# Patient Record
Sex: Female | Born: 1990 | Race: Black or African American | Hispanic: No | Marital: Single | Smoking: Never smoker
Health system: Southern US, Community
[De-identification: ages and names within clinical notes are randomized; demographics above are authoritative.]

## PROBLEM LIST (undated history)

## (undated) DIAGNOSIS — G809 Cerebral palsy, unspecified: Secondary | ICD-10-CM

## (undated) DIAGNOSIS — F70 Mild intellectual disabilities: Secondary | ICD-10-CM

## (undated) DIAGNOSIS — G40909 Epilepsy, unspecified, not intractable, without status epilepticus: Secondary | ICD-10-CM

## (undated) DIAGNOSIS — R569 Unspecified convulsions: Secondary | ICD-10-CM

## (undated) HISTORY — PX: CARDIAC SURGERY: SHX584

## (undated) HISTORY — PX: HIP ARTHROPLASTY: SHX981

## (undated) HISTORY — DX: Epilepsy, unspecified, not intractable, without status epilepticus: G40.909

## (undated) HISTORY — PX: EYE SURGERY: SHX253

## (undated) HISTORY — PX: OTHER SURGICAL HISTORY: SHX169

## (undated) HISTORY — DX: Mild intellectual disabilities: F70

## (undated) HISTORY — PX: INGUINAL HERNIA REPAIR: SHX194

## (undated) HISTORY — PX: KIDNEY SURGERY: SHX687

## (undated) HISTORY — DX: Unspecified convulsions: R56.9

## (undated) HISTORY — DX: Cerebral palsy, unspecified: G80.9

---
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
## 2008-09-07 ENCOUNTER — Emergency Department (HOSPITAL_COMMUNITY): Admission: EM | Admit: 2008-09-07 | Discharge: 2008-09-07 | Payer: Self-pay | Admitting: Emergency Medicine

## 2008-09-09 ENCOUNTER — Emergency Department (HOSPITAL_COMMUNITY): Admission: EM | Admit: 2008-09-09 | Discharge: 2008-09-10 | Payer: Self-pay | Admitting: Emergency Medicine

## 2010-08-17 ENCOUNTER — Encounter: Payer: Self-pay | Admitting: *Deleted

## 2010-08-30 ENCOUNTER — Encounter: Payer: Self-pay | Admitting: Cardiology

## 2010-09-14 ENCOUNTER — Inpatient Hospital Stay (INDEPENDENT_AMBULATORY_CARE_PROVIDER_SITE_OTHER)
Admission: RE | Admit: 2010-09-14 | Discharge: 2010-09-14 | Disposition: A | Discharge status: Home / Self Care | Payer: Medicaid Other | Source: Ambulatory Visit | Attending: Emergency Medicine | Admitting: Emergency Medicine

## 2010-09-14 ENCOUNTER — Ambulatory Visit (INDEPENDENT_AMBULATORY_CARE_PROVIDER_SITE_OTHER): Payer: Medicaid Other

## 2010-09-14 DIAGNOSIS — J309 Allergic rhinitis, unspecified: Secondary | ICD-10-CM

## 2010-09-14 DIAGNOSIS — R05 Cough: Secondary | ICD-10-CM

## 2010-10-05 ENCOUNTER — Encounter: Payer: Self-pay | Admitting: Internal Medicine

## 2010-10-10 ENCOUNTER — Institutional Professional Consult (permissible substitution): Payer: Self-pay | Admitting: Internal Medicine

## 2010-11-03 ENCOUNTER — Institutional Professional Consult (permissible substitution): Payer: Self-pay | Admitting: Internal Medicine

## 2011-07-06 ENCOUNTER — Encounter: Payer: Self-pay | Admitting: Emergency Medicine

## 2011-07-06 ENCOUNTER — Ambulatory Visit (INDEPENDENT_AMBULATORY_CARE_PROVIDER_SITE_OTHER): Payer: Medicaid Other | Admitting: Emergency Medicine

## 2011-07-06 VITALS — BP 130/80 | HR 86 | Temp 98.7°F | Wt 81.0 lb

## 2011-07-06 DIAGNOSIS — Z23 Encounter for immunization: Secondary | ICD-10-CM

## 2011-07-06 DIAGNOSIS — G809 Cerebral palsy, unspecified: Secondary | ICD-10-CM

## 2011-07-06 DIAGNOSIS — G40909 Epilepsy, unspecified, not intractable, without status epilepticus: Secondary | ICD-10-CM

## 2011-07-06 DIAGNOSIS — R011 Cardiac murmur, unspecified: Secondary | ICD-10-CM | POA: Insufficient documentation

## 2011-07-06 DIAGNOSIS — Z0000 Encounter for general adult medical examination without abnormal findings: Secondary | ICD-10-CM | POA: Insufficient documentation

## 2011-07-06 NOTE — Assessment & Plan Note (Signed)
Received flu and Hep B today.  Will investigate if she needs a pap smear as she is not sexually active.

## 2011-07-06 NOTE — Progress Notes (Signed)
Addended by: Phebe Colla on: 07/06/2011 05:18 PM   Modules accepted: Orders

## 2011-07-06 NOTE — Assessment & Plan Note (Signed)
S/p cardiac surgery as a child to repair a "leaky valve."  Was seen by Dr. Clelia Croft and the physician who took over his practice.  Will place a referral to [facility] as that was who the pediatric cardiologist recommended.

## 2011-07-06 NOTE — Progress Notes (Signed)
  Subjective:    Patient ID: Kristin Fitzgerald, female    DOB: Nov 18, 1990, 21 y.o.   MRN: [id number]  HPI Kristin Fitzgerald is here for a new patient appointment.  She was previously seen by [facility].  She was accompanied by her mother and history was obtained from both.  No current concerns.  Does have a history of seizures, but has not a seizure for several years.  This is followed by a neurologist.  I have reviewed and updated the following as appropriate: allergies, current medications, past family history, past medical history, past social history, past surgical history and problem list   Review of Systems  Constitutional: Negative for fever.  HENT: Negative for congestion, sore throat and rhinorrhea.   Respiratory: Negative for shortness of breath.   Cardiovascular: Negative for chest pain.  Gastrointestinal: Negative for abdominal pain.  Genitourinary: Negative for menstrual problem.  Musculoskeletal: Negative for myalgias and arthralgias.  Neurological: Negative for headaches.       Objective:   Physical Exam Vitals: reviewed HEENT: AT/[place], EOMI, PERRL, MMM, no pharyngeal erythema or exudate Neck: no LAD CV: RRR, 2/6 holosystolic murmur Pulm: CTAB, no wheezes, rales Abd: +BS, soft, non-tender Ext: no peripheral edema Skin: no rashes Neuro: in wheelchair     Assessment & Plan:

## 2011-07-06 NOTE — Patient Instructions (Signed)
It was wonderful to meet you!  It sounds like you are doing very well - making my job easy!  I will go ahead and place a referral to [facility] cardiology.  Their office should call you with an appointment in the next 2 weeks.  If they don't call, contact out office and we'll try to figure out what happened.  I will see you back in a year or sooner if needed.

## 2011-07-10 ENCOUNTER — Encounter: Payer: Self-pay | Admitting: *Deleted

## 2011-07-10 ENCOUNTER — Telehealth: Payer: Self-pay | Admitting: *Deleted

## 2011-07-10 NOTE — Telephone Encounter (Signed)
Scheduled appt with Dr.Crenshaw (Cardiology) 07-27-11 at 10 am. Ph # [phone]. Pt had 2 previous appt's before, one was cancelled and one no show. Tried to call pt, but number 'out of reach'. Will try again later. Also, mailed letter with appt info. Lorenda Hatchet, Renato Battles

## 2011-07-10 NOTE — Telephone Encounter (Signed)
See previous message re: appt .Arlyss Repress

## 2011-07-11 NOTE — Telephone Encounter (Signed)
Called mother and gave cardiology appt info.  Gaylene Brooks, RN

## 2011-07-27 ENCOUNTER — Ambulatory Visit: Payer: Medicaid Other | Admitting: Cardiology

## 2011-08-23 ENCOUNTER — Encounter: Payer: Self-pay | Admitting: *Deleted

## 2011-08-24 ENCOUNTER — Ambulatory Visit: Payer: Medicaid Other | Admitting: Cardiology

## 2011-08-29 ENCOUNTER — Telehealth: Payer: Self-pay | Admitting: Family Medicine

## 2011-08-29 NOTE — Telephone Encounter (Signed)
Pt having problems with stomach pain and headache since ~4pm today upon getting home from school.  Mother worried that she might vomit her seizure meds which were taken around 6pm.  No emesis as of yet, no diarrhea, no body aches, no fevers.  Advised use of tylenol and told her that, at this point and hour after taking her seizure meds, not to worry about them not getting into her system if she does vomit.  Advised that if not better by the morning to consider calling for an SDA appointment.

## 2011-09-18 ENCOUNTER — Encounter: Payer: Self-pay | Admitting: Cardiology

## 2011-09-18 ENCOUNTER — Ambulatory Visit (INDEPENDENT_AMBULATORY_CARE_PROVIDER_SITE_OTHER): Payer: Medicaid Other | Admitting: Cardiology

## 2011-09-18 VITALS — BP 131/70 | HR 86

## 2011-09-18 DIAGNOSIS — R011 Cardiac murmur, unspecified: Secondary | ICD-10-CM

## 2011-09-18 DIAGNOSIS — G809 Cerebral palsy, unspecified: Secondary | ICD-10-CM

## 2011-09-18 DIAGNOSIS — G40909 Epilepsy, unspecified, not intractable, without status epilepticus: Secondary | ICD-10-CM

## 2011-09-18 NOTE — Patient Instructions (Signed)

## 2011-09-18 NOTE — Progress Notes (Signed)
  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 I have no records available. She has had a murmur since childhood. She apparently had valve surgery at [facility] in 2007. Her mother states it was a leaky valve. She was followed here in [place] pediatric cardiology but has now age out. She does have cerebral palsy and is unable to ambulate. However there is no dyspnea, pedal edema, chest pain, or syncope  Current Outpatient Prescriptions  Medication Sig Dispense Refill  . CarBAMazepine (TEGRETOL PO) Take 200 mg by mouth 2 (two) times daily.       . Gabapentin (NEURONTIN PO) Take one tab in the morning and two at night        Allergies  Allergen Reactions  . Demerol Nausea And Vomiting    Past Medical History  Diagnosis Date  . Cerebral palsy   . Seizure disorder     Past Surgical History  Procedure Date  . Cardiac surgery     Repaired valve in 2007-[place]  . 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 Hip arthroplasty     for recurrent dislocation; rebuilt acetabulum    History   Social History  . Marital Status: Single    Spouse Name: N/A    Number of Children: N/A  . Years of Education: N/A   Occupational History  . Not on file.   Social History Main Topics  . Smoking status: Passive Smoker  . Smokeless tobacco: Not on file  . Alcohol Use: No  . Drug Use: No  . Sexually Active: Not on file   Other Topics Concern  . Not on file   Social History Narrative    Lives with her mother and 78 year old brother Berna Spare.  Attends [employer]. exercises daily.  Never sexually active.  No drug use no smoking no ETOH.  Very supportive family and excellent care.Graduating high school in 11/2011.      Family History  Problem Relation Age of Onset  . Asthma Brother     ROS: She has difficulties with hip problems; unable to read or write but no fevers or  chills, productive cough, hemoptysis, dysphasia, odynophagia, melena, hematochezia, dysuria, hematuria, rash, orthopnea, PND, pedal edema. Remaining systems are negative.  Physical Exam:   Blood pressure 131/70, pulse 86.  General:  Well developed/well nourished in NAD Skin warm/dry Patient not depressed No peripheral clubbing Back-severe scoliosis HEENT-strabismus/normal eyelids Neck supple/normal carotid upstroke bilaterally; no bruits; no JVD; no thyromegaly chest - CTA/ normal expansion CV - RRR/normal S1 and S2; 3/6 diastolic murmur left sternal border. 1/6 systolic murmur. Abdomen -NT/ND, no HSM, no mass, + bowel sounds, no bruit 2+ femoral pulses Ext-no edema,  2+ DP Neuro- patient with history of cerebral palsy. Unable to ambulate. Unable to read or write. Pleasant and oriented to person, place and time.  ECG sinus rhythm at a rate of 82. Right axis deviation. No ST changes.

## 2011-09-18 NOTE — Assessment & Plan Note (Signed)
Patient has what sounds to be a loud aortic insufficiency murmur. I will check an echocardiogram. We will obtain records from her pediatric cardiologist in from [facility] concerning her previous surgery. Further recommendations to follow.

## 2011-09-18 NOTE — Assessment & Plan Note (Signed)
Management per primary care. 

## 2011-09-19 ENCOUNTER — Telehealth: Payer: Self-pay | Admitting: Cardiology

## 2011-09-19 NOTE — Telephone Encounter (Signed)
ROI faxed to... [facility] @ [phone] & [facility] @ [phone] 09/19/11/KM

## 2011-09-25 NOTE — Telephone Encounter (Signed)
Records Received from [facility] gave to [facility]  09/25/11/KM

## 2011-10-04 NOTE — Telephone Encounter (Signed)
Request REceived back from Dr.Shaw/[facility] stating  They have No records on this pt  10/04/11/KM

## 2011-10-10 ENCOUNTER — Other Ambulatory Visit (HOSPITAL_COMMUNITY): Payer: Medicaid Other

## 2011-10-20 ENCOUNTER — Other Ambulatory Visit (HOSPITAL_COMMUNITY): Payer: Medicaid Other

## 2011-10-25 ENCOUNTER — Other Ambulatory Visit (HOSPITAL_COMMUNITY): Payer: Medicaid Other

## 2011-11-02 ENCOUNTER — Ambulatory Visit (HOSPITAL_COMMUNITY): Payer: Medicaid Other | Attending: Cardiology

## 2011-11-02 ENCOUNTER — Other Ambulatory Visit: Payer: Self-pay

## 2011-11-02 DIAGNOSIS — I359 Nonrheumatic aortic valve disorder, unspecified: Secondary | ICD-10-CM | POA: Insufficient documentation

## 2011-11-02 DIAGNOSIS — R011 Cardiac murmur, unspecified: Secondary | ICD-10-CM | POA: Insufficient documentation

## 2011-11-02 DIAGNOSIS — I059 Rheumatic mitral valve disease, unspecified: Secondary | ICD-10-CM | POA: Insufficient documentation

## 2011-11-02 DIAGNOSIS — G809 Cerebral palsy, unspecified: Secondary | ICD-10-CM | POA: Insufficient documentation

## 2011-11-07 ENCOUNTER — Encounter: Payer: Self-pay | Admitting: Family Medicine

## 2011-11-07 ENCOUNTER — Ambulatory Visit (INDEPENDENT_AMBULATORY_CARE_PROVIDER_SITE_OTHER): Payer: Medicaid Other | Admitting: Family Medicine

## 2011-11-07 VITALS — BP 109/70 | HR 76 | Temp 98.7°F

## 2011-11-07 DIAGNOSIS — K089 Disorder of teeth and supporting structures, unspecified: Secondary | ICD-10-CM

## 2011-11-07 DIAGNOSIS — K0889 Other specified disorders of teeth and supporting structures: Secondary | ICD-10-CM | POA: Insufficient documentation

## 2011-11-07 DIAGNOSIS — Z23 Encounter for immunization: Secondary | ICD-10-CM

## 2011-11-07 MED ORDER — AMOXICILLIN 400 MG/5ML PO SUSR: ORAL | Status: DC

## 2011-11-07 NOTE — Progress Notes (Signed)
  Subjective:    Patient ID: Kristin Fitzgerald, female    DOB: Mar 01, 1991, 21 y.o.   MRN: [id number]  HPI 3 days of tooth pain, right side.    Was better yesterday, hurts again today.  No fever, chills, able to eat.  Requests antibiotic so she can go to dentist- history of congenital heat disease  I have reviewed patient's  PMH, FH, and Social history and Medications as related to this visit. Developmentally delayed.  History of congenital heart disease Review of Systems See HPI    Objective:   Physical Exam GEN: Alert & Oriented, No acute distress, in wheelchair Mouth:  Right lower tooth missing, no significant swelling, inflammation       Assessment & Plan:

## 2011-11-07 NOTE — Assessment & Plan Note (Signed)
Chronically poor dentition and gingival disease.  Pain over area missing tooth- no acute abscess or infection.  Will rx amoxicllin (needs liquid meds) to give 1 hr before dental procedure due to history of repaired congenital heart disease.  But no need for continuous antibiotic treatment.  Gave info on [employer].

## 2011-11-07 NOTE — Patient Instructions (Signed)
Call dentist for apopintment  Take amoxicillin prior to apopintment

## 2012-05-31 ENCOUNTER — Ambulatory Visit (INDEPENDENT_AMBULATORY_CARE_PROVIDER_SITE_OTHER): Payer: Medicaid Other | Admitting: *Deleted

## 2012-05-31 DIAGNOSIS — Z23 Encounter for immunization: Secondary | ICD-10-CM

## 2012-07-03 ENCOUNTER — Encounter: Payer: Medicaid Other | Admitting: Emergency Medicine

## 2012-07-26 ENCOUNTER — Encounter: Payer: Medicaid Other | Admitting: Emergency Medicine

## 2012-08-15 ENCOUNTER — Encounter: Payer: Self-pay | Admitting: Emergency Medicine

## 2012-08-15 ENCOUNTER — Ambulatory Visit (INDEPENDENT_AMBULATORY_CARE_PROVIDER_SITE_OTHER): Payer: Medicaid Other | Admitting: Emergency Medicine

## 2012-08-15 VITALS — BP 144/87 | HR 98 | Temp 97.5°F | Wt 81.0 lb

## 2012-08-15 LAB — POCT WET PREP (WET MOUNT): Clue Cells Wet Prep Whiff POC: NEGATIVE

## 2012-08-15 MED ORDER — FLUCONAZOLE 10 MG/ML PO SUSR: 150.0000 mg | Freq: Once | ORAL | Status: DC

## 2012-08-15 NOTE — Patient Instructions (Addendum)
It was nice to see you! You have a yeast infection. Please avoid scents and dyes in all lotions, soaps and detergents. Only use soap twice a week. Take the diflucan.  You can repeat the dose in 3 days if symptoms persist.

## 2012-08-15 NOTE — Progress Notes (Signed)
  Subjective:    Patient ID: Kristin Fitzgerald, female    DOB: [birth year], 22 y.o.   MRN: [id number]  HPI Kristin Fitzgerald is here for a SDA with her mother for vaginal discharge.  Mom reports that Kristin Fitzgerald has been complaining of vaginal itching and some white discharge over the last 2-3 days.  Kristin Fitzgerald is not sexually active.  Washes vulva daily with dove soap and wash cloth.  Wears diapers that are changed frequently by her mother.  No fevers, n/v/d.  Healthcare maintanence: Mom requesting form be filled out for an aide to come to the house and assist with routine care.   I have reviewed and updated the following as appropriate: allergies, current medications and past medical history SHx: passive smoke exposure   Review of Systems See HPI    Objective:   Physical Exam BP 144/87  Pulse 98  Temp(Src) 97.5 F (36.4 C) (Oral)  Wt 81 lb (36.741 kg)  LMP 08/01/2012 Gen: alert, cooperative, no distress, appears embarrassed Pelvic: external genitalia normal female; small excoriation on right labia majora, internal exam deferred; mother collected blind wet prep sample     Assessment & Plan:  22 yo F with CP and seizure disorder presenting with vaginal discharge found to have yeast infection on wet prep.  Yeast infection: Will treat with diflucan 150mg  x1 with repeat dose in 3 days if no improvement of symptoms.  Also discussed avoid dyes and scents in lotions, soaps and detergents.

## 2012-08-15 NOTE — Assessment & Plan Note (Signed)
Form filled out for home aide to assist with ADLs.  Due for pap smear - however given her extreme discomfort with the idea of a pelvic exam and her abstinence, will defer for now.

## 2012-08-27 ENCOUNTER — Telehealth: Payer: Self-pay | Admitting: *Deleted

## 2012-08-27 NOTE — Telephone Encounter (Signed)
Called pt's mom and checked on the process for Personal Care Services (We have faxed the application for services at last OV. Pt's mom reports, that they will come to their house on Monday and evaluate patient's needs. (Pt's mom also has copy of the application) .Kristin Fitzgerald

## 2012-09-11 ENCOUNTER — Telehealth: Payer: Self-pay | Admitting: Emergency Medicine

## 2012-09-11 NOTE — Telephone Encounter (Signed)
Mother states  patient has just recently started with a little cough and nasal  Stuffiness. Mother bought some sudafed but not sure she should use. No fever. Consulted with Dr. Lum Babe. Recommended not to use anything with decongestant . May use OTC cough med without decongestant, saline nasal spray , cool mist humidifer . If  worsening  call for appointment.

## 2012-09-11 NOTE — Telephone Encounter (Signed)
pt has had open heart surgery in 2007 and she has a stuffy nose/cough - wants to know what OTC she can give her.

## 2012-09-12 NOTE — Telephone Encounter (Signed)
Pt is now c/o of a headache and wants to know what she can give her. pls advise

## 2012-09-12 NOTE — Telephone Encounter (Signed)
Mother states she usually givesTylenol or Motrin. Advised this will be OK to give.  Offered appointment for nasal congestion and appointment is scheduled for tomorrow.

## 2012-09-13 ENCOUNTER — Encounter: Payer: Self-pay | Admitting: Family Medicine

## 2012-09-13 ENCOUNTER — Ambulatory Visit (INDEPENDENT_AMBULATORY_CARE_PROVIDER_SITE_OTHER): Payer: Medicaid Other | Admitting: Family Medicine

## 2012-09-13 VITALS — BP 130/77 | HR 104 | Wt 81.0 lb

## 2012-09-13 DIAGNOSIS — R0981 Nasal congestion: Secondary | ICD-10-CM

## 2012-09-13 DIAGNOSIS — J3489 Other specified disorders of nose and nasal sinuses: Secondary | ICD-10-CM

## 2012-09-13 NOTE — Progress Notes (Signed)
  Subjective:    Patient ID: Kristin Fitzgerald, female    DOB: 04-Jun-1991, 22 y.o.   MRN: [id number]  HPI: Pt presents to clinic for SDA for about 3 days of nasal congestion and dry cough with development of headaches yesterday. 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 Pt began with symptoms of cough with nasal congestion earlier this week; no production to cough, and cough seems to be slightly better than it was, some improvement with Delsym cough syrup; also some complaints of chest soreness secondary to cough. Headaches yesterday seem to be related to congestion; pt indicates central forehead/between eyes as main location of pain. Pt has reduced appetite but is "drinking okay," with normal voids and stools. Some reduced level of activity and sleeping poorly. Mother gave Tylenol around 6 PM for pain and pt was able to "sleep a little better" than before. Cool mist humidifier did not help. Mother has been avoiding decongestant medications due to history of heart surgery (valve replacement in 2007).  Pt has a history of seizures, stable on Tegretol. Also takes Neurontin. No new side effects or difficulties. No recent seizures.  Review of Systems: As above. Mother reports no fever. No obvious difficulty breathing or audible wheezing. No abdominal pain, N/V or diarrhea. Otherwise unable to directly gain much other detail from patient. Sick contacts: nephew with similar but milder symptoms for a few days prior to pt's illness, mother now with some cough/congestion that developed after pt's symptoms.     Objective:   Physical Exam BP 130/77  Pulse 104  Wt 81 lb (36.741 kg)  SpO2 97%  LMP 08/29/2012 Gen: non-toxic appearing wheelchair-bound young adult HEENT: pupils equally round/reactive to light, conjunctivae clear, MMM; TM's clear bilaterally  post oropharynx without exudate; some redness/slight cobblestoning  Nasal mucosa  red and slightly boggy, no rhinorrhea or frank nasal discharge  Some sinus tenderness worse at nasal bridge and over frontal sinus than over maxillary sinus Cardio: RRR; heart murmur noted, 2-3/6 at left sternal border Pulm: slightly coarse breath sounds equal bilaterally, but generally CTAB with good air movement  no localized areas of reduced breath sounds, no wheezes Skin: no obvious rash; warm, dry     Assessment & Plan:

## 2012-09-13 NOTE — Assessment & Plan Note (Signed)
A: Generally non-toxic appearing with normal vital signs. Exam not concerning for flagrant bacterial infection or pneumonia.  P: Discussed with Dr. Earnest Bailey. Recommended continued supportive care. Discussed use of Afrin on a short-term basis, Vick's vapor-rub, guaifenesin to help loosen secretions. Continue Tylenol for pain/discomfort. Red flags reviewed. Follow up PRN.

## 2012-09-13 NOTE — Patient Instructions (Signed)
Thank you for coming in, today. I'm sorry Kristin Fitzgerald is not feeling well. There are a few other things you can try to help her congestion.    Vick's vapor-rub may help some if she can tolerate the smell.    Afrin nasal spray may help, as well. Do not use it for more than 3 days or so.    Mucinex may also help to loosen some of the congestion. She can continue to use Tylenol and her cough medicine. If her symptoms don't start to get better over the weekend, or if she begins to get worse, call the clinic back or go to the emergency room. Some signs to watch out for are high fevers, difficulty breathing, refusal to eat or drink at all, or crying without making tears. Please feel free to call with questions at any time. --Dr. Casper Harrison

## 2012-10-19 ENCOUNTER — Encounter (HOSPITAL_COMMUNITY): Payer: Self-pay | Admitting: Emergency Medicine

## 2012-10-19 ENCOUNTER — Emergency Department (HOSPITAL_COMMUNITY)
Admission: EM | Admit: 2012-10-19 | Discharge: 2012-10-19 | Disposition: A | Discharge status: Home / Self Care | Payer: Medicaid Other | Attending: Emergency Medicine | Admitting: Emergency Medicine

## 2012-10-19 DIAGNOSIS — R300 Dysuria: Secondary | ICD-10-CM | POA: Insufficient documentation

## 2012-10-19 DIAGNOSIS — Z8669 Personal history of other diseases of the nervous system and sense organs: Secondary | ICD-10-CM | POA: Insufficient documentation

## 2012-10-19 DIAGNOSIS — M549 Dorsalgia, unspecified: Secondary | ICD-10-CM

## 2012-10-19 DIAGNOSIS — Z96649 Presence of unspecified artificial hip joint: Secondary | ICD-10-CM | POA: Insufficient documentation

## 2012-10-19 DIAGNOSIS — Z9889 Other specified postprocedural states: Secondary | ICD-10-CM | POA: Insufficient documentation

## 2012-10-19 DIAGNOSIS — G40909 Epilepsy, unspecified, not intractable, without status epilepticus: Secondary | ICD-10-CM | POA: Insufficient documentation

## 2012-10-19 DIAGNOSIS — Z79899 Other long term (current) drug therapy: Secondary | ICD-10-CM | POA: Insufficient documentation

## 2012-10-19 DIAGNOSIS — Z3202 Encounter for pregnancy test, result negative: Secondary | ICD-10-CM | POA: Insufficient documentation

## 2012-10-19 LAB — URINALYSIS, ROUTINE W REFLEX MICROSCOPIC
Bilirubin Urine: NEGATIVE
Leukocytes, UA: NEGATIVE
Nitrite: NEGATIVE
Protein, ur: NEGATIVE mg/dL
Urobilinogen, UA: 1 mg/dL (ref 0.0–1.0)
pH: 8 (ref 5.0–8.0)

## 2012-10-19 LAB — URINE MICROSCOPIC-ADD ON

## 2012-10-19 MED ORDER — IBUPROFEN 400 MG PO TABS
400.0000 mg | ORAL_TABLET | Freq: Once | ORAL | Status: DC
  Filled 2012-10-19: qty 1

## 2012-10-19 NOTE — ED Provider Notes (Signed)
History     CSN: [id number]  Arrival date & time 10/19/12  1430   First MD Initiated Contact with Patient 10/19/12 1522      Chief Complaint  Patient presents with  . Back Pain    (Consider location/radiation/quality/duration/timing/severity/associated sxs/prior treatment) HPI  Kristin Fitzgerald is a 22 y.o. female with past medical history significant for cerebral palsy and seizure disorder, accompanied by her mother who states that she's been complaining of back pain and dysuria onset last night. Denies fever, nausea vomiting, reduced by mouth intake. Level V caveat secondary to her cerebral palsy.    Past Medical History  Diagnosis Date  . Cerebral palsy   . Seizure disorder     Past Surgical History  Procedure Laterality Date  . Cardiac surgery      Repaired valve in 2007-[place]  . 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 Hip arthroplasty      for recurrent dislocation; rebuilt acetabulum    Family History  Problem Relation Age of Onset  . Asthma Brother     History  Substance Use Topics  . Smoking status: Passive Smoke Exposure - Never Smoker  . Smokeless tobacco: Not on file  . Alcohol Use: No    OB History   Grav Para Term Preterm Abortions TAB SAB Ect Mult Living                  Review of Systems  Constitutional: Negative for fever.  Gastrointestinal: Negative for nausea, vomiting and diarrhea.  Genitourinary: Positive for dysuria.  Musculoskeletal: Positive for back pain.  All other systems reviewed and are negative.    Allergies  Demerol  Home Medications   Current Outpatient Rx  Name  Route  Sig  Dispense  Refill  . amoxicillin (AMOXIL) 400 MG/5ML suspension      2000 mg 30-60 minutes prior to dental procedure   50 mL   0   . carbamazepine (CARBATROL) 200 MG 12 hr capsule   Oral   Take 200 mg by mouth 2 (two) times daily.        Marland Kitchen gabapentin (NEURONTIN) 100 MG capsule   Oral   Take 100-200 mg by mouth 2 (two) times daily. Take 1 capsule in the morning (100 mg) and 2 capsules at bedtime (200 mg)           BP 126/53  Pulse 89  Temp(Src) 98.4 F (36.9 C) (Oral)  Resp 18  SpO2 100%  LMP 10/14/2012  Physical Exam  Nursing note and vitals reviewed. Constitutional: She is oriented to person, place, and time. She appears well-developed and well-nourished. No distress.  Patient is comfortable, smiling, good eye contact, coloring in a coloring book, in no acute distress  HENT:  Head: Normocephalic.  Mouth/Throat: Oropharynx is clear and moist.  Eyes: Conjunctivae and EOM are normal. Pupils are equal, round, and reactive to light.  Cardiovascular: Normal rate, regular rhythm and intact distal pulses.   Pulmonary/Chest: Effort normal and breath sounds normal. No stridor. No respiratory distress. She has no wheezes. She has no rales. She exhibits no tenderness.  Abdominal: Soft. Bowel sounds are normal. She exhibits no distension and no mass. There is no tenderness. There is no rebound and no guarding.  Genitourinary:  NO CVA TTP b/l  Musculoskeletal: Normal range of motion. She exhibits no  edema.  Neurological: She is alert and oriented to person, place, and time.    ED Course  Procedures (including critical care time)  Labs Reviewed  URINALYSIS, ROUTINE W REFLEX MICROSCOPIC - Abnormal; Notable for the following:    APPearance CLOUDY (*)    Hgb urine dipstick MODERATE (*)    All other components within normal limits  URINE MICROSCOPIC-ADD ON - Abnormal; Notable for the following:    Squamous Epithelial / LPF MANY (*)    All other components within normal limits  POCT PREGNANCY, URINE   No results found.   1. Back pain       MDM   Kristin Fitzgerald is a 22 y.o. female with cerebral palsy accompanied by her mother who reports that the patient has had back pain and dysuria since last  night.  And out cath UA specimen shows hemoglobin with no signs of infection. Patient ended her menstruation yesterday. No indication to treat for UTI at this time. I will culture the urine recommends pain control with acetaminophen as her mother has been doing previously. Vital signs are stable and patient's mother is amenable to care plan and discharge at this time.  Discussed case with attending who agrees with plan and stability to d/c to home.    Filed Vitals:   10/19/12 1600 10/19/12 1610 10/19/12 1615 10/19/12 1630  BP: 128/91 128/91 121/83 123/56  Pulse: 99 90 94 92  Temp:  98 F (36.7 C)    TempSrc:  Oral    Resp:  20    SpO2: 100% 100% 100% 100%     Pt verbalized understanding and agrees with care plan. Outpatient follow-up and return precautions given.       Wynetta Emery, PA-C 10/19/12 1725

## 2012-10-19 NOTE — ED Notes (Signed)
Mother stated, the daughter was c/o back pain and burning with urination.

## 2012-10-20 NOTE — ED Provider Notes (Signed)
Medical screening examination/treatment/procedure(s) were performed by non-physician practitioner and as supervising physician I was immediately available for consultation/collaboration.    Kevonta Phariss D Kavitha Lansdale, MD 10/20/12 1102 

## 2012-10-21 LAB — URINE CULTURE

## 2012-11-25 ENCOUNTER — Other Ambulatory Visit: Payer: Self-pay

## 2012-11-25 DIAGNOSIS — G40209 Localization-related (focal) (partial) symptomatic epilepsy and epileptic syndromes with complex partial seizures, not intractable, without status epilepticus: Secondary | ICD-10-CM

## 2012-11-25 MED ORDER — GABAPENTIN 100 MG PO CAPS: ORAL_CAPSULE | ORAL | Status: DC

## 2013-01-24 ENCOUNTER — Other Ambulatory Visit: Payer: Self-pay | Admitting: Family

## 2013-01-24 DIAGNOSIS — G40209 Localization-related (focal) (partial) symptomatic epilepsy and epileptic syndromes with complex partial seizures, not intractable, without status epilepticus: Secondary | ICD-10-CM

## 2013-01-24 DIAGNOSIS — G40309 Generalized idiopathic epilepsy and epileptic syndromes, not intractable, without status epilepticus: Secondary | ICD-10-CM

## 2013-01-24 MED ORDER — CARBAMAZEPINE ER 200 MG PO CP12: ORAL_CAPSULE | ORAL | Status: DC

## 2013-04-16 ENCOUNTER — Ambulatory Visit (INDEPENDENT_AMBULATORY_CARE_PROVIDER_SITE_OTHER): Payer: Medicaid Other | Admitting: Emergency Medicine

## 2013-04-16 ENCOUNTER — Encounter: Payer: Self-pay | Admitting: Emergency Medicine

## 2013-04-16 VITALS — BP 123/68 | HR 83 | Temp 98.0°F

## 2013-04-16 DIAGNOSIS — Z23 Encounter for immunization: Secondary | ICD-10-CM

## 2013-04-16 DIAGNOSIS — Z0000 Encounter for general adult medical examination without abnormal findings: Secondary | ICD-10-CM

## 2013-04-16 DIAGNOSIS — N898 Other specified noninflammatory disorders of vagina: Secondary | ICD-10-CM | POA: Insufficient documentation

## 2013-04-16 DIAGNOSIS — G40909 Epilepsy, unspecified, not intractable, without status epilepticus: Secondary | ICD-10-CM

## 2013-04-16 DIAGNOSIS — R011 Cardiac murmur, unspecified: Secondary | ICD-10-CM

## 2013-04-16 LAB — POCT WET PREP (WET MOUNT): Clue Cells Wet Prep Whiff POC: POSITIVE

## 2013-04-16 MED ORDER — FLUCONAZOLE 150 MG PO TABS: 150.0000 mg | ORAL_TABLET | Freq: Once | ORAL | Status: DC

## 2013-04-16 MED ORDER — METRONIDAZOLE 500 MG PO TABS: 500.0000 mg | ORAL_TABLET | Freq: Two times a day (BID) | ORAL | Status: DC

## 2013-04-16 NOTE — Assessment & Plan Note (Addendum)
Self collected wet prep shows BV Will treat with flagyl 500mg  BID x7 days, followed by diflucan 150mg  x1. Called and left message notified mom of results.

## 2013-04-16 NOTE — Patient Instructions (Signed)
It was nice to see you!  Please let me know if you need a referral for her to see her cardiologist, Dr. Jens Som.  Schedule an appointment in the next few months for a pap smear.  I will call with the results of the wet prep.

## 2013-04-16 NOTE — Progress Notes (Signed)
  Subjective:    Patient ID: Kristin Fitzgerald, female    DOB: 1990/07/28, 22 y.o.   MRN: [id number]  HPI ALEENE SWANNER is here for her annual exam and flu shot with her mother who is the primary care giver.  I have reviewed and updated the following as appropriate: allergies, current medications, past family history, past medical history, past social history, past surgical history and problem list PMHx: CP, seizures, heart murmur  SHx: passive smoke exposure  Seizures Mom reports no seizure activity.  Due to see neurology soon.  No changes in medications or doses.  Murmur Had heart surgery as a child.  Last seen by Dr. Jens Som in 10/2011.  Mom is working on setting up a f/u appt.  No chest pain, dyspnea, syncope, dizziness, palpitations, leg swelling.  Vaginal discharge She was treated for a yeast infection earlier this year.  She has started complaining of vaginal itching and mom has noticed some thick white discharge again.  Mom states she has been washing every other day with soap.  Review of Systems See HPI    Objective:   Physical Exam BP 123/68  Pulse 83  Temp(Src) 98 F (36.7 C) (Oral)  LMP 03/26/2013 Gen: alert, cooperative, NAD, sitting in wheelchair comfortably HEENT: AT/[place], sclera white, MMM, PERRL, no pharnygeal erythema or exudate, TMs obscured by cerumen bilaterally Neck: supple CV: RRR, 3/6 diastolic murmur at LUSB Pulm: CTAB, no wheezes or rales Abd: +BS, soft, NTND Ext: no edema      Assessment & Plan:

## 2013-04-16 NOTE — Addendum Note (Signed)
Addended by: Charm Rings on: 04/16/2013 05:34 PM   Modules accepted: Orders

## 2013-04-16 NOTE — Assessment & Plan Note (Signed)
Flu shot given today. Discussed pap smear extensively.  Mom will discuss with her further at home and schedule a dedicated appointment for the pap smear in the next few months.

## 2013-04-16 NOTE — Assessment & Plan Note (Signed)
Appears to be stable.  No cardiac symptoms today. Mom will call [facility] about a follow up appt since it has been over 1 year since she has been seen. I will be happy to provide a referral if needed.

## 2013-04-16 NOTE — Assessment & Plan Note (Signed)
Stable on carbamazepine and neurontin. Sees neurology once a year.

## 2013-04-17 ENCOUNTER — Telehealth: Payer: Self-pay | Admitting: Emergency Medicine

## 2013-04-17 DIAGNOSIS — N898 Other specified noninflammatory disorders of vagina: Secondary | ICD-10-CM

## 2013-04-17 MED ORDER — METRONIDAZOLE 50 MG/ML ORAL SUSPENSION: 500.0000 mg | Freq: Two times a day (BID) | ORAL | Status: DC

## 2013-04-17 MED ORDER — METRONIDAZOLE 500 MG PO TABS: 500.0000 mg | ORAL_TABLET | Freq: Two times a day (BID) | ORAL | Status: DC

## 2013-04-17 MED ORDER — FLUCONAZOLE 10 MG/ML PO SUSR: 150.0000 mg | Freq: Every day | ORAL | Status: DC

## 2013-04-17 NOTE — Telephone Encounter (Signed)
Mother called because both of the prescriptions from yesterday are in pill form. Her daughter can not swallow pills, she would like Dr. Piedad Climes to call in the liquid form to the pharmacy. She has not picked up the prescriptions yes. Please call her at her work phone is [phone] ask for Misty Stanley. Myriam Jacobson

## 2013-04-17 NOTE — Telephone Encounter (Signed)
Pt notified.  Tysin Salada L, CMA  

## 2013-04-17 NOTE — Telephone Encounter (Signed)
I have resent prescriptions in liquid form.

## 2013-04-17 NOTE — Telephone Encounter (Signed)
I changed the prescription for Flagyl back to pills.  The pharmacy does not have the liquid formulation.  The pills can be crushed and mixed with applesauce.

## 2013-04-17 NOTE — Telephone Encounter (Signed)
Will fwd to MD.  Hayk Divis L, CMA  

## 2013-04-18 NOTE — Telephone Encounter (Signed)
Attempted several times today to reach patient's mother and was unsuccessful.  If patient calls back, please give her message below.  Yvonnia Tango, Darlyne Russian, CMA

## 2013-05-19 DIAGNOSIS — G40309 Generalized idiopathic epilepsy and epileptic syndromes, not intractable, without status epilepticus: Secondary | ICD-10-CM

## 2013-05-19 DIAGNOSIS — M4150 Other secondary scoliosis, site unspecified: Secondary | ICD-10-CM

## 2013-05-19 DIAGNOSIS — G40209 Localization-related (focal) (partial) symptomatic epilepsy and epileptic syndromes with complex partial seizures, not intractable, without status epilepticus: Secondary | ICD-10-CM

## 2013-05-19 DIAGNOSIS — Q120 Congenital cataract: Secondary | ICD-10-CM | POA: Insufficient documentation

## 2013-05-19 DIAGNOSIS — H519 Unspecified disorder of binocular movement: Secondary | ICD-10-CM | POA: Insufficient documentation

## 2013-05-19 DIAGNOSIS — G808 Other cerebral palsy: Secondary | ICD-10-CM | POA: Insufficient documentation

## 2013-05-19 DIAGNOSIS — F70 Mild intellectual disabilities: Secondary | ICD-10-CM | POA: Insufficient documentation

## 2013-05-19 DIAGNOSIS — R32 Unspecified urinary incontinence: Secondary | ICD-10-CM

## 2013-05-26 ENCOUNTER — Other Ambulatory Visit: Payer: Self-pay | Admitting: Family

## 2013-05-27 ENCOUNTER — Ambulatory Visit: Payer: Self-pay | Admitting: Pediatrics

## 2013-05-31 ENCOUNTER — Other Ambulatory Visit: Payer: Self-pay | Admitting: Family

## 2013-06-03 ENCOUNTER — Other Ambulatory Visit: Payer: Self-pay | Admitting: Family

## 2013-07-08 ENCOUNTER — Encounter: Payer: Self-pay | Admitting: Pediatrics

## 2013-07-08 ENCOUNTER — Ambulatory Visit (INDEPENDENT_AMBULATORY_CARE_PROVIDER_SITE_OTHER): Payer: Medicaid Other | Admitting: Pediatrics

## 2013-07-08 VITALS — BP 100/70 | HR 108 | Wt 105.0 lb

## 2013-07-08 DIAGNOSIS — M4150 Other secondary scoliosis, site unspecified: Secondary | ICD-10-CM

## 2013-07-08 DIAGNOSIS — H53002 Unspecified amblyopia, left eye: Secondary | ICD-10-CM

## 2013-07-08 DIAGNOSIS — G40209 Localization-related (focal) (partial) symptomatic epilepsy and epileptic syndromes with complex partial seizures, not intractable, without status epilepticus: Secondary | ICD-10-CM

## 2013-07-08 DIAGNOSIS — H5010 Unspecified exotropia: Secondary | ICD-10-CM

## 2013-07-08 DIAGNOSIS — R32 Unspecified urinary incontinence: Secondary | ICD-10-CM

## 2013-07-08 DIAGNOSIS — H53009 Unspecified amblyopia, unspecified eye: Secondary | ICD-10-CM

## 2013-07-08 DIAGNOSIS — F70 Mild intellectual disabilities: Secondary | ICD-10-CM

## 2013-07-08 DIAGNOSIS — G808 Other cerebral palsy: Secondary | ICD-10-CM

## 2013-07-08 DIAGNOSIS — G40309 Generalized idiopathic epilepsy and epileptic syndromes, not intractable, without status epilepticus: Secondary | ICD-10-CM

## 2013-07-08 MED ORDER — GABAPENTIN 100 MG PO CAPS: ORAL_CAPSULE | ORAL | Status: DC

## 2013-07-08 MED ORDER — CARBATROL 200 MG PO CP12: ORAL_CAPSULE | ORAL | Status: DC

## 2013-07-08 NOTE — Progress Notes (Signed)
Patient: Kristin Fitzgerald MRN: [id number] Sex: female DOB: 1990-06-30  Provider: Deetta PerlaHICKLING,WILLIAM H, MD [facility]  Note type: Routine return visit  History of Present Illness: [facility]  Dr. Elwyn ReachBooth History from: mother, patient and [facility] chart Chief Complaint: Seizures and spastic quadriparesis  Kristin Fitzgerald is a 23 y.o. 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  The patient was seen on July 08, 2013, for the first time since May 27, 2012.  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  She had significant cognitive impairment and quadriparesis with sparing of her left arm.  She requires wheelchair for ambulation.  She has well controlled seizures.  She is largely dependent on others for most activities of daily living.  She is unable to read, wtite, or calculate.  She is able to communicate her wants and needs.  She is dysarthric, but intelligible.  Little has changed since she was seen 13 months ago.  She remains at home.  She is on the waiting list for CAPS.  Her mother missed an opportunity to sign her up when she was younger and now there is not only a long waiting list, but the number of individuals allowed into the program has been cut.  Her brother is not working.  He stays home with her during the day.  At times his girlfriend also provides care.  Mother works from 8 to 5, Monday through Friday and comes home at noontime to change her diaper and help to feed her.  She has an aide paid for by Medicaid.  If she had a CAPS worker, she would have greater ability to take advantage of socialization with other young adults who have disabilities.    Overall, her health has been good.  She had a urinary  tract infection a couple of months ago diagnosed with symptoms of dysuria.  She has had no seizures in years.  She takes and tolerates Carbatrol and gabapentin.  Review of Systems: 12 system review was unremarkable  Past Medical History  Diagnosis Date  . Cerebral palsy   . Seizure disorder   . Seizures   . Mild intellectual disabilities    Hospitalizations: yes, Head Injury: no, Nervous System Infections: yes, Immunizations up to date: yes Past Medical History Comments: See surgical Hx for hospitalizations.  Birth History 3 lbs. 1 oz. Infant born at 1730 weeks gestational age  Gestation was complicated by premature delivery for unknown reasons. Nursery Course was complicated by seizures a day 3 of life.  The patient had no hemorrhage but had periventricular leukomalacia. Growth and Development was recalled as  abnormal  Behavior History none  Surgical History Past Surgical History  Procedure Laterality Date  . Cardiac surgery      Repaired valve in 2007-[place]  . 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 Hip arthroplasty      for recurrent dislocation; rebuilt acetabulum  . Inguinal hernia repair    . Other surgical history      [facility] as an infant    Family History family history includes Asthma in her brother; Breast cancer in her maternal aunt; Kidney failure in her maternal  grandmother; Leukemia in her maternal grandfather. Family History is negative migraines, seizures, cognitive impairment, blindness, deafness, birth defects, chromosomal disorder, autism.  Social History History   Social History  . Marital Status: Single    Spouse Name: N/A    Number of Children: N/A  . Years of Education: N/A   Social History Main Topics  . Smoking status: Passive Smoke Exposure - Never Smoker  . Smokeless tobacco: Never Used  . Alcohol Use: No  . Drug Use: No  .  Sexual Activity: No   Other Topics Concern  . None   Social History Narrative    Lives with her mother and 48 year old brother Berna Spare.  Attends [employer]. exercises daily.  Never sexually active.  No drug use no smoking no ETOH.  Very supportive family and excellent care.      Graduating high school in 11/2011.     Educational level special education Living with mother, brother and nephew  Hobbies/Interest: Enjoys coloring, listening to music and playing on her electronic tablet and cell phone. School comments [name] graduated from [employer] in 2013.  Current Outpatient Prescriptions on File Prior to Visit  Medication Sig Dispense Refill  . CARBATROL 200 MG 12 hr capsule TAKE 1 CAPSULE BY MOUTH IN THE MORNING AND TAKE ONE CAPSULE BY MOUTH IN THE EVENING  62 capsule  1  . gabapentin (NEURONTIN) 100 MG capsule TAKE ONE CAPSULE BY MOUTH EVERY MORNING AND TAKE 2 CAPSULES BY MOUTH AT BEDTIME  93 capsule  1  . fluconazole (DIFLUCAN) 10 MG/ML suspension Take 15 mLs (150 mg total) by mouth daily.  35 mL  2  . metroNIDAZOLE (FLAGYL) 500 MG tablet Take 1 tablet (500 mg total) by mouth 2 (two) times daily. Okay to crush tablets and mix with applesauce  14 tablet  0   No current facility-administered medications on file prior to visit.   The medication list was reviewed and reconciled. All changes or newly prescribed medications were explained.  A complete medication list was provided to the patient/caregiver.  Allergies  Allergen Reactions  . Demerol Nausea And Vomiting    Physical Exam BP 100/70  Pulse 108  Wt 105 lb (47.628 kg)  General: alert, well developed, well nourished, in no acute distress, black hair, brown eyes, left handed Head: normocephalic, no dysmorphic features Ears, Nose and Throat: Otoscopic: Tympanic membranes normal.  Pharynx: oropharynx is pink without exudates or tonsillar hypertrophy. Neck: supple, full range of motion, no cranial or cervical  bruits Respiratory: auscultation clear Cardiovascular: no murmurs, pulses are normal Musculoskeletal: Flexion contractures at the right elbow, both knees, tight heel cords, neuromuscular scoliosis convex left, right hemiatrophy involving the arm more than the leg Skin: no rashes or neurocutaneous lesions  Neurologic Exam  Mental Status: alert; oriented to person, place and year; knowledge is below normal for age; language is normal, Speech is dysarthric but intelligible able to name objects and follow commands. Cranial Nerves: visual fields are full to double simultaneous stimuli; extraocular movements are full but dysconjugate, left exotropia with amblyopia; pupils are round reactive to light; funduscopic examination shows sharp disc margins with normal vessels; symmetric facial strength; midline tongue and uvula; air conduction is greater than bone conduction bilaterally. Motor: Asymmetrical strength.  The patient has not able to reach out as far with the right arm as the left.She has dystonic posturing of her fingers on the right.  He has significant clumsiness in both hands and is unable to bring  her thumb past her index finger or to touch the tip of either index finger.  Strength is 4/5 in the right arm, 4+ over 5 in left arm, 1-2/5 in the right leg, 3/5 in the left leg at the knee, I could not get her to spontaneously move either foot.  She has significant limitation of range of motion in her right hip. Sensory: intact responses to cold, vibration, proprioception and stereognosis Coordination: Clumsy, unable to be tested Gait and Station: Wheelchair bound Reflexes: symmetric and normal bilaterally; no clonus; bilateral extensor plantar responses.  Assessment  1. 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 2. 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 3. 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 4. 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 5. 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 6. Unspecified urinary incontinence (788.30). 7. 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  Discussion The patient is neurologically stable.  From a social viewpoint, this is extremely unfortunate.  She would definitely benefit from attending programs available to adults with disabilities, but is unable to do so because she does not have a Merchandiser, retail.  I do not think she has skills that would allow her to be gainfully employed.  In general, she seems happy.  It would seem to me that the life she has is one of dependence.  Her mother has provided great care for her.  I see no significant difference in her examination in comparison with 13 months ago.  Plan I refilled her prescriptions for Carbatrol and gabapentin.  There is no reason to make any changes in those meds.  I will plan to see her in a year, but will see her sooner depending upon clinical need. I spent 30 minutes of face-to-face time with the patient and mother more than half of it in consultation.    Medication List       This list is accurate as of: 07/08/13 11:59 PM.  Always use your most recent med list.               CARBATROL 200 MG 12 hr capsule  Generic drug:  carbamazepine  TAKE 1 CAPSULE BY MOUTH IN THE MORNING AND TAKE ONE CAPSULE BY MOUTH IN THE EVENING     fluconazole 10 MG/ML suspension  Commonly known as:  DIFLUCAN  Take 15 mLs (150 mg total) by mouth daily.     gabapentin 100 MG capsule  Commonly known as:  NEURONTIN  TAKE ONE CAPSULE BY MOUTH EVERY MORNING AND TAKE 2 CAPSULES BY MOUTH AT BEDTIME     metroNIDAZOLE 500 MG tablet  Commonly known as:  FLAGYL  Take 1 tablet (500 mg total) by mouth 2 (two) times daily. Okay to crush tablets and mix with applesauce       Deetta Perla MD

## 2013-08-22 ENCOUNTER — Ambulatory Visit (INDEPENDENT_AMBULATORY_CARE_PROVIDER_SITE_OTHER): Payer: Medicaid Other | Admitting: Family Medicine

## 2013-08-22 ENCOUNTER — Encounter: Payer: Self-pay | Admitting: Family Medicine

## 2013-08-22 VITALS — BP 131/83 | HR 102 | Temp 98.1°F | Wt 102.0 lb

## 2013-08-22 DIAGNOSIS — J069 Acute upper respiratory infection, unspecified: Secondary | ICD-10-CM

## 2013-08-22 NOTE — Patient Instructions (Signed)
Upper Respiratory Infection I did not see anything to suggest a bacterial infection.  The most important thing is to get a lot of rest and stay well hydrated.  If you develop fevers, worsening of symptoms beyond the time we discussed (1 week), worsening of symptoms after they get better at any point, please schedule a follow up visit or give us a call for advise.   For cough-. You can also use honey every 1-2 hours and especially at bedtime.  Due to the seizure history, I would avoid over the counter cough syrups and medicines.  For congestion-try a neti pot and make sure to follow instructions for water. If she cannot tolerate this, you can try nasal saline spray to help prevent those nose bleeds.

## 2013-08-22 NOTE — Progress Notes (Signed)
  Kristin ConchStephen Corvin Sorbo, MD Phone: [phone]  Subjective:  Chief complaint-noted  Upper Respiratory Infection Patient complains of symptoms of a URI. Symptoms include congestion, coryza, non productive cough and sneezing. Onset of symptoms was 1 day ago, and has been gradually worsening since that time. Treatment to date: none. Patient does complain of mild chest pain but only when she coughs and no associated shortness of breath.  ROS- no fever/chills/nausea/vomiting  Past Medical History- mother states has history of bronchitis, pneumonia. No history of asthma or smoking. Epilepsy, congenital quadriplegia from cerebral palsy, scoloiosis, mild intellectual disability. Known heart murmur from history of valve repair.   Medications- reviewed and updated Current Outpatient Prescriptions  Medication Sig Dispense Refill  . CARBATROL 200 MG 12 hr capsule TAKE 1 CAPSULE BY MOUTH IN THE MORNING AND TAKE ONE CAPSULE BY MOUTH IN THE EVENING  62 capsule  5  . gabapentin (NEURONTIN) 100 MG capsule TAKE ONE CAPSULE BY MOUTH EVERY MORNING AND TAKE 2 CAPSULES BY MOUTH AT BEDTIME  93 capsule  5   No current facility-administered medications for this visit.    Objective: BP 131/83  Pulse 102  Temp(Src) 98.1 F (36.7 C) (Oral)  Wt 102 lb (46.267 kg)  LMP 08/08/2013 Gen: NAD, resting comfortably in wheelchair, quadriplegic HEENT: nares erythematous and swollen with slight clear rhinorrhea, oropharynx normal without pharyngeal exudate, TM normal bilaterally, Mucous membranes are moist. Neck: no lymphadenopathy CV: RRR no murmurs rubs or gallops Lungs: CTAB no crackles, wheeze, rhonchi Abdomen: soft/nontender/nondistended Skin: warm, dry, no rash  Assessment/Plan:  Upper Respiratory Infection Advised of symptomatic care (see AVS). Avoid OTC medicines given seizure history. Stick to saline nasal spray and honey at bedtime.  Doubt influenza as afebrile Given reasons for return

## 2014-01-31 ENCOUNTER — Other Ambulatory Visit: Payer: Self-pay | Admitting: Pediatrics

## 2014-02-03 ENCOUNTER — Other Ambulatory Visit: Payer: Self-pay | Admitting: Pediatrics

## 2014-03-22 ENCOUNTER — Encounter (HOSPITAL_COMMUNITY): Payer: Self-pay | Admitting: Emergency Medicine

## 2014-03-22 ENCOUNTER — Emergency Department (HOSPITAL_COMMUNITY)
Admission: EM | Admit: 2014-03-22 | Discharge: 2014-03-22 | Disposition: A | Discharge status: Home / Self Care | Payer: Medicaid Other | Attending: Emergency Medicine | Admitting: Emergency Medicine

## 2014-03-22 DIAGNOSIS — R519 Headache, unspecified: Secondary | ICD-10-CM

## 2014-03-22 DIAGNOSIS — R300 Dysuria: Secondary | ICD-10-CM | POA: Diagnosis not present

## 2014-03-22 DIAGNOSIS — Z79899 Other long term (current) drug therapy: Secondary | ICD-10-CM | POA: Diagnosis not present

## 2014-03-22 DIAGNOSIS — J3489 Other specified disorders of nose and nasal sinuses: Secondary | ICD-10-CM | POA: Diagnosis not present

## 2014-03-22 DIAGNOSIS — R51 Headache: Secondary | ICD-10-CM | POA: Insufficient documentation

## 2014-03-22 DIAGNOSIS — G40909 Epilepsy, unspecified, not intractable, without status epilepticus: Secondary | ICD-10-CM | POA: Diagnosis not present

## 2014-03-22 DIAGNOSIS — Z8659 Personal history of other mental and behavioral disorders: Secondary | ICD-10-CM | POA: Insufficient documentation

## 2014-03-22 LAB — URINALYSIS, ROUTINE W REFLEX MICROSCOPIC
Bilirubin Urine: NEGATIVE
GLUCOSE, UA: NEGATIVE mg/dL
HGB URINE DIPSTICK: NEGATIVE
Ketones, ur: NEGATIVE mg/dL
LEUKOCYTES UA: NEGATIVE
Nitrite: NEGATIVE
PH: 6.5 (ref 5.0–8.0)
Protein, ur: NEGATIVE mg/dL
Specific Gravity, Urine: 1.024 (ref 1.005–1.030)
Urobilinogen, UA: 1 mg/dL (ref 0.0–1.0)

## 2014-03-22 LAB — GRAM STAIN

## 2014-03-22 MED ORDER — KETOROLAC TROMETHAMINE 30 MG/ML IJ SOLN
30.0000 mg | Freq: Once | INTRAMUSCULAR | Status: AC
  Administered 2014-03-22: 30 mg via INTRAVENOUS
  Filled 2014-03-22 (×2): qty 1

## 2014-03-22 MED ORDER — METOCLOPRAMIDE HCL 5 MG/ML IJ SOLN
10.0000 mg | Freq: Once | INTRAMUSCULAR | Status: AC
  Administered 2014-03-22: 10 mg via INTRAVENOUS
  Filled 2014-03-22 (×2): qty 2

## 2014-03-22 MED ORDER — SODIUM CHLORIDE 0.9 % IV BOLUS (SEPSIS)
1000.0000 mL | Freq: Once | INTRAVENOUS | Status: AC
  Administered 2014-03-22: 1000 mL via INTRAVENOUS

## 2014-03-22 MED ORDER — DIPHENHYDRAMINE HCL 50 MG/ML IJ SOLN
25.0000 mg | Freq: Once | INTRAMUSCULAR | Status: AC
  Administered 2014-03-22: 25 mg via INTRAVENOUS
  Filled 2014-03-22 (×2): qty 1

## 2014-03-22 NOTE — ED Notes (Signed)
Pt presents to department for evaluation of headache. Onset today. Pt has history of cerebral palsy and seizure disorder. Pt is alert and states "my head hurts."

## 2014-03-22 NOTE — ED Notes (Signed)
Attempted IV start, unsuccessful. Will page IV team.

## 2014-03-22 NOTE — ED Provider Notes (Signed)
CSN: [id number]     Arrival date & time 03/22/14  1515 History   First MD Initiated Contact with Patient 03/22/14 1724     Chief Complaint  Patient presents with  . Headache     (Consider location/radiation/quality/duration/timing/severity/associated sxs/prior Treatment) Patient is a 23 y.o. female presenting with headaches.  Headache Pain location:  Frontal Quality:  Dull Radiates to:  Does not radiate Severity currently:  5/10 Severity at highest:  7/10 Onset quality:  Gradual Duration:  1 day Timing:  Constant Progression:  Improving Chronicity:  Recurrent Similar to prior headaches: yes   Context: not exposure to bright light   Relieved by:  NSAIDs Worsened by:  Nothing tried Ineffective treatments:  None tried Associated symptoms: congestion   Associated symptoms: no fever and no vomiting   Risk factors: sedentary lifestyle     Past Medical History  Diagnosis Date  . Cerebral palsy   . Seizure disorder   . Seizures   . Mild intellectual disabilities    Past Surgical History  Procedure Laterality Date  . Cardiac surgery      Repaired valve in 2007-[place]  . 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 Hip arthroplasty      for recurrent dislocation; rebuilt acetabulum  . Inguinal hernia repair    . Other surgical history      [facility] [place] as an infant   Family History  Problem Relation Age of Onset  . Asthma Brother   . Breast cancer Maternal Aunt     Dx in 2010, In remission  . Kidney failure Maternal Grandmother     Died at 56  . Leukemia Maternal Grandfather     Died at 63   History  Substance Use Topics  . Smoking status: Passive Smoke Exposure - Never Smoker  . Smokeless tobacco: Never Used  . Alcohol Use: No   OB History   Grav Para Term Preterm Abortions TAB SAB Ect Mult Living                 Review of Systems  Unable to perform ROS:  Other  Constitutional: Negative for fever.  HENT: Positive for congestion.   Gastrointestinal: Negative for vomiting.  Genitourinary: Positive for dysuria.  Neurological: Positive for headaches.      Allergies  Demerol  Home Medications   Prior to Admission medications   Medication Sig Start Date End Date Taking? Authorizing Provider  carbamazepine (CARBATROL) 200 MG 12 hr capsule Take 200 mg by mouth 2 (two) times daily.   Yes Historical Provider, MD  gabapentin (NEURONTIN) 100 MG capsule Take 100-200 mg by mouth 2 (two) times daily. 1 capsule (100 mg) every AM and 2 capsules (200 mg) every day at bedtime   Yes Historical Provider, MD  ibuprofen (ADVIL,MOTRIN) 100 MG/5ML suspension Take 5 mg/kg by mouth every 4 (four) hours as needed for fever or mild pain.   Yes Historical Provider, MD   BP 102/74  Pulse 86  Temp(Src) 98.2 F (36.8 C) (Oral)  Resp 20  SpO2 100% Physical Exam  Constitutional: She is oriented to person, place, and time. She appears well-nourished. She appears ill. No distress.  HENT:  Head: Normocephalic and atraumatic.  Eyes: EOM are normal.  Neck: Neck supple.  Cardiovascular: Normal rate, regular rhythm and normal heart sounds.   Pulmonary/Chest: Effort  normal and breath sounds normal. No stridor. No respiratory distress. She has no wheezes.  Abdominal: Soft. Bowel sounds are normal. There is no tenderness.  Musculoskeletal: She exhibits no edema.  Lymphadenopathy:    She has no cervical adenopathy.  Neurological: She is alert and oriented to person, place, and time. No cranial nerve deficit. GCS eye subscore is 4. GCS verbal subscore is 5. GCS motor subscore is 6.  Skin: She is not diaphoretic.  Psychiatric: She has a normal mood and affect. Her behavior is normal.    ED Course  Procedures (including critical care time) Labs Review Labs Reviewed  GRAM STAIN  URINE CULTURE  URINALYSIS, ROUTINE W REFLEX MICROSCOPIC    Imaging Review No results  found.   EKG Interpretation None      MDM   Final diagnoses:  Headache in front of head    H/o CP, seizures that p/w HA x 1 day.  Some improvement with motrin, but in the past seizures were associated with HA and mother called pcp and told to come to ED. On arrival pt has no seizure activity and is not post ictal.  Pt has appropriate verbal responses but only answers in 1 word statements.  Mother provided HPI.  Mother also concerned for UTI and URI.  Pt has no PE findings of URI and UA is wnl.  Pt reports no new neuro deficits and had complete resolution of headache after IVF, benadryl, Toradol, and Reglan.  Pt to f/u with pcp if symptoms return.     Beverely Risen, MD 03/23/14 [phone]

## 2014-03-22 NOTE — ED Notes (Signed)
Gram stain urine sent as add on to lab.

## 2014-03-22 NOTE — ED Notes (Signed)
IV team at bedside 

## 2014-03-22 NOTE — Discharge Instructions (Signed)

## 2014-03-24 LAB — URINE CULTURE
Colony Count: NO GROWTH
Culture: NO GROWTH
Special Requests: NORMAL

## 2014-03-24 NOTE — ED Provider Notes (Signed)
This patient was seen in conjunction with the resident physician.  The documentation accurately reflects the patient's ED evaluation (with the following clarifications).  On my exam, this young female with CP was difficult to assess, but seemed in no distress.  History of present illness was provided by the patient's mother. Patient presents here, her evaluation was largely reassuring.   Gerhard Munchobert Lejuan Botto, MD 03/24/14 1001

## 2014-04-21 ENCOUNTER — Ambulatory Visit (INDEPENDENT_AMBULATORY_CARE_PROVIDER_SITE_OTHER): Payer: Medicaid Other | Admitting: Family Medicine

## 2014-04-21 ENCOUNTER — Encounter: Payer: Self-pay | Admitting: Family Medicine

## 2014-04-21 VITALS — BP 136/71 | HR 85 | Temp 98.1°F

## 2014-04-21 DIAGNOSIS — Z0000 Encounter for general adult medical examination without abnormal findings: Secondary | ICD-10-CM

## 2014-04-21 DIAGNOSIS — H6123 Impacted cerumen, bilateral: Secondary | ICD-10-CM | POA: Diagnosis not present

## 2014-04-21 DIAGNOSIS — Z23 Encounter for immunization: Secondary | ICD-10-CM

## 2014-04-21 NOTE — Patient Instructions (Signed)
It was nice to meet you both today. Seems like things are going well at home. We talked about getting a Pap smear today. Make an appointment at the front desk as you are leaving to get one in the next couple of months.  Take care, Dr. Beryle FlockBacigalupo (Dr. B)    Things to do to keep yourself healthy  - Exercise at least 30-45 minutes a day, 3-4 days a week.  - Eat a low-fat diet with lots of fruits and vegetables, up to 7-9 servings per day.  - Seatbelts can save your life. Wear them always.  - Smoke detectors on every level of your home, check batteries every year.  - Eye Doctor - have an eye exam every 1-2 years  - Safe sex - if you may be exposed to STDs, use a condom.  - Alcohol -  If you drink, do it moderately, less than 2 drinks per day.  - Health Care Power of Attorney. Choose someone to speak for you if you are not able.  - Depression is common in our stressful world.If you're feeling down or losing interest in things you normally enjoy, please come in for a visit.  - Violence - If anyone is threatening or hurting you, please call immediately.   Pap Test A Pap test checks the cells on the surface of your cervix. Your doctor will look for cell changes that are not normal, an infection, or cancer. If the cells no longer look normal, it is called dysplasia. Dysplasia can turn into cancer. Regular Pap tests are important to stop cancer from developing. BEFORE THE PROCEDURE  Ask your doctor when to schedule your Pap test. Timing the test around your period may be important.  Do not douche or have sex (intercourse) for 24 hours before the test.  Do not put creams on your vagina or use tampons for 24 hours before the test.  Go pee (urinate) just before the test. PROCEDURE  You will lie on an exam table with your feet in stirrups.  A warm metal or plastic tool (speculum) will be put in your vagina to open it up.  Your doctor will use a small, plastic brush or wooden spatula to take  cells from your cervix.  The cells will be put in a lab container.  The cells will be checked under a microscope to see if they are normal or not. AFTER THE PROCEDURE Get your test results. If they are abnormal, you may need more tests. Document Released: 07/15/2010 Document Revised: 09/04/2011 Document Reviewed: 06/08/2011 [facility] Patient Information 2015 [employer] [place]. This information is not intended to replace advice given to you by your health care provider. Make sure you discuss any questions you have with your health care provider.

## 2014-04-21 NOTE — Assessment & Plan Note (Signed)
Attempted to disimpact in clinic.  Patient did not tolerate. Advised mom that she can buy drops OTC.

## 2014-04-21 NOTE — Progress Notes (Signed)
   Subjective:   Kristin FillersJacqueline A Hanning is a 23 y.o. female with a history of CP, seizure disorder, congenital paraplegia here for CPE.  Patient and mom deny any problems.  Patient seen in the ED ~1 month ago for sinus pressure.  Took pseudoephedrine with relief of symptoms.  No longer taking.  Discussed pap smears.  Mom has had many discussions with the patient about this at home.  Patient is resistant.   No recent seizures.  Patient followed by Neurology. No changes in medications or doses.   Review of Systems:  Per HPI. All other systems reviewed and are negative.   PMH, PSH, Medications, Allergies, and FmHx reviewed and updated in EMR.  Social History: never smoker  Objective:  BP 136/71  Pulse 85  Temp(Src) 98.1 F (36.7 C) (Oral)  LMP 04/07/2014  Gen:  23 y.o. female sitting in wheelchair in NAD HEENT: NCAT, MMM, EOMI, PERRL, anicteric sclerae, TMs obscured by cerumen bilaterally Neck: Supple, No LAD/thyromegaly CV: RRR, 3/6 diastolic murmur heard best at LUSB Resp: Non-labored, CTAB, no wheezes noted Abd: Soft, NTND, BS present, no guarding or organomegaly Ext: WWP, no edema    Assessment:     Kristin Fitzgerald is a 23 y.o. female here for CPE.    Plan:     See problem list for problem-specific plans.   Shirlee LatchAngela Bacigalupo, MD PGY-1,  [facility] 04/21/2014  4:15 PM

## 2014-04-21 NOTE — Assessment & Plan Note (Addendum)
Patient is doing well. - Flu shot given today - UTD on TDAP - Pap smear discussed extensively. Mom to discuss with patient further at home and schedule dedicated appointment for Pap smear in the next few months. - BP slightly elevated today.  Will continue to monitor. Likely related to agitation.

## 2014-06-22 ENCOUNTER — Emergency Department (HOSPITAL_COMMUNITY)
Admission: EM | Admit: 2014-06-22 | Discharge: 2014-06-22 | Disposition: A | Discharge status: Home / Self Care | Payer: Medicaid Other | Attending: Emergency Medicine | Admitting: Emergency Medicine

## 2014-06-22 ENCOUNTER — Telehealth: Payer: Self-pay | Admitting: Family Medicine

## 2014-06-22 ENCOUNTER — Encounter (HOSPITAL_COMMUNITY): Payer: Self-pay | Admitting: Family Medicine

## 2014-06-22 ENCOUNTER — Emergency Department (HOSPITAL_COMMUNITY): Payer: Medicaid Other

## 2014-06-22 DIAGNOSIS — R05 Cough: Secondary | ICD-10-CM | POA: Diagnosis not present

## 2014-06-22 DIAGNOSIS — Z79899 Other long term (current) drug therapy: Secondary | ICD-10-CM | POA: Diagnosis not present

## 2014-06-22 DIAGNOSIS — Z8669 Personal history of other diseases of the nervous system and sense organs: Secondary | ICD-10-CM | POA: Insufficient documentation

## 2014-06-22 DIAGNOSIS — R059 Cough, unspecified: Secondary | ICD-10-CM

## 2014-06-22 LAB — URINALYSIS, ROUTINE W REFLEX MICROSCOPIC
Bilirubin Urine: NEGATIVE
Glucose, UA: NEGATIVE mg/dL
HGB URINE DIPSTICK: NEGATIVE
Ketones, ur: NEGATIVE mg/dL
LEUKOCYTES UA: NEGATIVE
Nitrite: NEGATIVE
PH: 7.5 (ref 5.0–8.0)
PROTEIN: NEGATIVE mg/dL
Specific Gravity, Urine: 1.028 (ref 1.005–1.030)
Urobilinogen, UA: 1 mg/dL (ref 0.0–1.0)

## 2014-06-22 MED ORDER — ALBUTEROL SULFATE HFA 108 (90 BASE) MCG/ACT IN AERS
1.0000 | INHALATION_SPRAY | Freq: Four times a day (QID) | RESPIRATORY_TRACT | Status: DC | PRN
  Administered 2014-06-22: 1 via RESPIRATORY_TRACT
  Filled 2014-06-22: qty 6.7

## 2014-06-22 NOTE — Discharge Instructions (Signed)
Use albuterol as needed. Recommend delsym or robitussin cough medication. Follow-up with pediatrician. Return to the ED for new concerns.

## 2014-06-22 NOTE — ED Notes (Signed)
Pt here with non productive cough, pain with coughing, low grade fever per mom.

## 2014-06-22 NOTE — Telephone Encounter (Signed)
Pts mom asking to speak with a nurse, wants to know what medicine she can give pt for a cough over the counter

## 2014-06-22 NOTE — ED Provider Notes (Signed)
CSN: [id number]     Arrival date & time 06/22/14  1611 History   First MD Initiated Contact with Patient 06/22/14 1930     Chief Complaint  Patient presents with  . Cough     (Consider location/radiation/quality/duration/timing/severity/associated sxs/prior Treatment) Patient is a 23 y.o. female presenting with cough. The history is provided by the patient, medical records and a parent.  Cough   This is a 23 year old female with past medical history significant for cerebral palsy, seizure disorder, intellectual delay, presenting to the ED for cough and nasal congestion. Mother states this is been ongoing for the past several days, nonproductive. She also notes low-grade fevers, highest was 100.32F.  states she has been giving her Tylenol with improvement of fever. There've been no episodes of labored breathing, apenic spells, or cyanotic color change.  Patient denies current chest pain or SOB.  Patient does have hx of asthma as a child, no issues as an adult.    Past Medical History  Diagnosis Date  . Cerebral palsy   . Seizure disorder   . Seizures   . Mild intellectual disabilities    Past Surgical History  Procedure Laterality Date  . Cardiac surgery      Repaired valve in 2007-[place]  . 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 Hip arthroplasty      for recurrent dislocation; rebuilt acetabulum  . Inguinal hernia repair    . Other surgical history      [facility] as an infant   Family History  Problem Relation Age of Onset  . Asthma Brother   . Breast cancer Maternal Aunt     Dx in 2010, In remission  . Kidney failure Maternal Grandmother     Died at 7174  . Leukemia Maternal Grandfather     Died at 5968   History  Substance Use Topics  . Smoking status: Passive Smoke Exposure - Never Smoker  . Smokeless tobacco: Never Used  . Alcohol Use: No   OB History    No data  available     Review of Systems  HENT: Positive for congestion.   Respiratory: Positive for cough.   All other systems reviewed and are negative.     Allergies  Demerol  Home Medications   Prior to Admission medications   Medication Sig Start Date End Date Taking? Authorizing Provider  acetaminophen (TYLENOL) 160 MG/5ML solution Take 320 mg by mouth every 6 (six) hours as needed for mild pain.   Yes Historical Provider, MD  carbamazepine (CARBATROL) 200 MG 12 hr capsule Take 200 mg by mouth 2 (two) times daily.   Yes Historical Provider, MD  gabapentin (NEURONTIN) 100 MG capsule Take 100-200 mg by mouth 2 (two) times daily. 1 capsule (100 mg) every AM and 2 capsules (200 mg) every day at bedtime   Yes Historical Provider, MD   BP 130/69 mmHg  Pulse 97  Temp(Src) 98 F (36.7 C)  Resp 22  SpO2 100%  LMP 06/08/2014   Physical Exam  Constitutional: She is oriented to person, place, and time. She appears well-developed and well-nourished. No distress.  HENT:  Head: Normocephalic and atraumatic.  Right Ear: Tympanic membrane and ear canal normal.  Left Ear: Tympanic membrane and ear canal normal.  Nose: Mucosal edema and rhinorrhea (clear) present.  Mouth/Throat: Uvula is midline,  oropharynx is clear and moist and mucous membranes are normal. No oropharyngeal exudate, posterior oropharyngeal edema, posterior oropharyngeal erythema or tonsillar abscesses.  Eyes: Conjunctivae and EOM are normal. Pupils are equal, round, and reactive to light.  Neck: Normal range of motion. Neck supple.  Cardiovascular: Normal rate, regular rhythm and normal heart sounds.   Pulmonary/Chest: Effort normal and breath sounds normal. No respiratory distress. She has no wheezes. She has no rhonchi.  Respirations unlabored, no wheezes or rhonchi, dry cough noted  Abdominal: Soft. Bowel sounds are normal. There is no tenderness. There is no guarding.  Musculoskeletal: Normal range of motion.   Neurological: She is alert and oriented to person, place, and time.  Skin: Skin is warm and dry. She is not diaphoretic.  Psychiatric: She has a normal mood and affect.  Nursing note and vitals reviewed.   ED Course  Procedures (including critical care time) Labs Review Labs Reviewed - No data to display  Imaging Review Dg Chest 2 View  06/22/2014   CLINICAL DATA:  Cough, congestion and chest pain for several days, history of heart surgery 2007, cerebral palsy.  EXAM: CHEST  2 VIEW  COMPARISON:  Chest radiograph September 14, 2010  FINDINGS: Cardiac silhouette is upper limits of normal in size, similar. Status post median sternotomy. The lungs are clear without pleural effusions or focal consolidations. Trachea projects midline and there is no pneumothorax. Soft tissue planes and included osseous structures are non-suspicious. Thoracolumbar levoscoliosis, increased from prior imaging which may be in part positional.  IMPRESSION: Borderline cardiomegaly, no acute pulmonary process.   Electronically Signed   By: Awilda Metroourtnay  Bloomer   On: 06/22/2014 17:58     EKG Interpretation None      MDM   Final diagnoses:  Cough   23 year old female with nonproductive cough. On exam, patient afebrile and nontoxic in appearance. Her respirations are unlabored and lungs are clear bilaterally. Chest x-ray negative for acute findings.  Vital signs have remained stable on room air. Feel patient can be discharged. Mother requests albuterol inhaler which was given. Discharge to continue over-the-counter cough syrups as needed. Follow-up with primary care physician.  Discussed plan with mother, she acknowledged understanding and agreed with plan of care.  Return precautions given for new or worsening symptoms.  At time of discharge mother states she feels that her daughter has been "holding her urine".  States she has changed her diaper 3 times since she has been in the ED which is somewhat abnormal. Urinalysis  was sent and is unremarkable. Patient will be discharged home with original plan.  Garlon HatchetLisa M Marvel Mcphillips, PA-C 06/22/14 2217  Gilda Creasehristopher J. Pollina, MD 06/22/14 2330

## 2014-06-22 NOTE — Telephone Encounter (Signed)
Spoke with pt's mom stating pt has a fever, chest pain and SOB.  Advised mom to take [facility] ED [facility].  Mom stated that fever was 101 and given tylenol around 11:30 AM.  Mom stated she was going to take pt to ED since they know better. Clovis PuMartin, Tamika L, RN

## 2014-06-23 ENCOUNTER — Ambulatory Visit: Payer: Medicaid Other | Admitting: Family Medicine

## 2014-06-27 ENCOUNTER — Telehealth: Payer: Self-pay | Admitting: Family Medicine

## 2014-06-27 ENCOUNTER — Encounter (HOSPITAL_COMMUNITY): Payer: Self-pay

## 2014-06-27 ENCOUNTER — Emergency Department (INDEPENDENT_AMBULATORY_CARE_PROVIDER_SITE_OTHER)
Admission: EM | Admit: 2014-06-27 | Discharge: 2014-06-27 | Disposition: A | Discharge status: Home / Self Care | Payer: Medicaid Other | Source: Home / Self Care | Attending: Emergency Medicine | Admitting: Emergency Medicine

## 2014-06-27 DIAGNOSIS — J069 Acute upper respiratory infection, unspecified: Secondary | ICD-10-CM | POA: Diagnosis not present

## 2014-06-27 DIAGNOSIS — B9789 Other viral agents as the cause of diseases classified elsewhere: Principal | ICD-10-CM

## 2014-06-27 MED ORDER — CETIRIZINE HCL 5 MG/5ML PO SYRP: 10.0000 mg | ORAL_SOLUTION | Freq: Every day | ORAL | Status: DC

## 2014-06-27 MED ORDER — IPRATROPIUM BROMIDE 0.06 % NA SOLN: 2.0000 | Freq: Four times a day (QID) | NASAL | Status: DC

## 2014-06-27 NOTE — Discharge Instructions (Signed)
I think the cough is likely coming nasal drainage. Take zyrtec  daily for the next 2 weeks. Use the nasal spary 4 times a day for the next week. Follow up with her doctor as scheduled on Friday.

## 2014-06-27 NOTE — Telephone Encounter (Signed)
Emergency Line / After Hours Call  Pt's mother called the emergency line because pt is having significant cough, frequent enough to keep her from eating. Has been taking OTC delsym without relief. Was seen in ED on 12/28 with negative CXR. No respiratory distress currently at all, just frequent cough. Recommended mother take pt to Urgent Care for re-evaluation today since she feels that the cough is worse. Mother appreciated and understood these instructions.  Levert Feinstein, MD Family Medicine PGY-3

## 2014-06-27 NOTE — ED Notes (Signed)
Parent concerned about persistent cough x 1 week. Was reportedly advised in ED to use delsym for her cough symptoms, but her cough has gotten worse , per mother. NAD, talking on cell phone on this writer's entrance to room for assessment

## 2014-06-27 NOTE — ED Provider Notes (Signed)
CSN: [id number]     Arrival date & time 06/27/14  1319 History   First MD Initiated Contact with Patient 06/27/14 1336     Chief Complaint  Patient presents with  . Cough   (Consider location/radiation/quality/duration/timing/severity/associated sxs/prior Treatment) HPI She is a 24 year old woman here with her mother for evaluation of cough. She was seen in the emergency room on Tuesday for this. At that time she had a chest x-ray which was negative. Mom has been giving her albuterol and Delysum without much improvement. She is having paroxysmal coughing spells. They're sometimes associated with eating. She is also having significant clear rhinorrhea. No fever since Tuesday. No nausea or vomiting. Her appetite is normal, but the coughing makes it difficult to eat.  Past Medical History  Diagnosis Date  . Cerebral palsy   . Seizure disorder   . Seizures   . Mild intellectual disabilities    Past Surgical History  Procedure Laterality Date  . Cardiac surgery      Repaired valve in 2007-[place]  . 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 Hip arthroplasty      for recurrent dislocation; rebuilt acetabulum  . Inguinal hernia repair    . Other surgical history      [facility] as an infant   Family History  Problem Relation Age of Onset  . Asthma Brother   . Breast cancer Maternal Aunt     Dx in 2010, In remission  . Kidney failure Maternal Grandmother     Died at 35  . Leukemia Maternal Grandfather     Died at 8   History  Substance Use Topics  . Smoking status: Passive Smoke Exposure - Never Smoker  . Smokeless tobacco: Never Used  . Alcohol Use: No   OB History    No data available     Review of Systems  Constitutional: Negative for fever, chills and appetite change.  HENT: Positive for rhinorrhea.   Respiratory: Positive for cough. Negative for choking and shortness  of breath.   Gastrointestinal: Negative.     Allergies  Demerol  Home Medications   Prior to Admission medications   Medication Sig Start Date End Date Taking? Authorizing Provider  acetaminophen (TYLENOL) 160 MG/5ML solution Take 320 mg by mouth every 6 (six) hours as needed for mild pain.    Historical Provider, MD  carbamazepine (CARBATROL) 200 MG 12 hr capsule Take 200 mg by mouth 2 (two) times daily.    Historical Provider, MD  cetirizine HCl (ZYRTEC) 5 MG/5ML SYRP Take 10 mLs (10 mg total) by mouth daily. 06/27/14   Charm Rings, MD  gabapentin (NEURONTIN) 100 MG capsule Take 100-200 mg by mouth 2 (two) times daily. 1 capsule (100 mg) every AM and 2 capsules (200 mg) every day at bedtime    Historical Provider, MD  ipratropium (ATROVENT) 0.06 % nasal spray Place 2 sprays into both nostrils 4 (four) times daily. 06/27/14   Charm Rings, MD   BP 142/55 mmHg  Pulse 83  Temp(Src) 98.6 F (37 C) (Oral)  Resp 16  SpO2 100%  LMP 06/08/2014 Physical Exam  Constitutional: She appears well-developed and well-nourished. No distress.  HENT:  Head: Normocephalic and atraumatic.  Right Ear: External ear normal.  Left Ear: External ear normal.  Nose: Rhinorrhea present.  Mouth/Throat: No oropharyngeal  exudate or posterior oropharyngeal erythema.  Mild cobblestoning  Cardiovascular: Normal rate, regular rhythm and normal heart sounds.   No murmur heard. Pulmonary/Chest: Effort normal and breath sounds normal. No respiratory distress. She has no wheezes. She has no rales.  Neurological: She is alert.    ED Course  Procedures (including critical care time) Labs Review Labs Reviewed - No data to display  Imaging Review No results found.   MDM   1. Viral URI with cough    I suspect her cough is coming from postnasal drainage. No fevers or shortness of breath to suggest pneumonia, and she had a negative chest x-ray on Tuesday. We'll treat with Zyrtec and Atrovent nasal  spray. Follow-up with PCP as scheduled on Friday.    Charm Rings, MD 06/27/14 1414

## 2014-07-03 ENCOUNTER — Ambulatory Visit: Payer: Medicaid Other | Admitting: Family Medicine

## 2014-07-27 ENCOUNTER — Other Ambulatory Visit: Payer: Self-pay | Admitting: *Deleted

## 2014-07-27 MED ORDER — IPRATROPIUM BROMIDE 0.06 % NA SOLN: 2.0000 | Freq: Four times a day (QID) | NASAL | Status: DC

## 2014-08-05 ENCOUNTER — Other Ambulatory Visit: Payer: Self-pay | Admitting: Family

## 2014-08-12 ENCOUNTER — Other Ambulatory Visit: Payer: Self-pay | Admitting: Family

## 2014-08-26 ENCOUNTER — Encounter: Payer: Self-pay | Admitting: Pediatrics

## 2014-08-26 ENCOUNTER — Ambulatory Visit (INDEPENDENT_AMBULATORY_CARE_PROVIDER_SITE_OTHER): Payer: Medicaid Other | Admitting: Pediatrics

## 2014-08-26 VITALS — BP 96/56 | HR 96

## 2014-08-26 DIAGNOSIS — G808 Other cerebral palsy: Secondary | ICD-10-CM | POA: Diagnosis not present

## 2014-08-26 DIAGNOSIS — R011 Cardiac murmur, unspecified: Secondary | ICD-10-CM

## 2014-08-26 DIAGNOSIS — G40209 Localization-related (focal) (partial) symptomatic epilepsy and epileptic syndromes with complex partial seizures, not intractable, without status epilepticus: Secondary | ICD-10-CM | POA: Diagnosis not present

## 2014-08-26 DIAGNOSIS — G40309 Generalized idiopathic epilepsy and epileptic syndromes, not intractable, without status epilepticus: Secondary | ICD-10-CM

## 2014-08-26 DIAGNOSIS — F70 Mild intellectual disabilities: Secondary | ICD-10-CM | POA: Diagnosis not present

## 2014-08-26 MED ORDER — CARBATROL 200 MG PO CP12: ORAL_CAPSULE | ORAL | Status: DC

## 2014-08-26 MED ORDER — GABAPENTIN 100 MG PO CAPS: ORAL_CAPSULE | ORAL | Status: DC

## 2014-08-26 NOTE — Progress Notes (Signed)
Patient: Kristin Fitzgerald MRN: [id number] Sex: female DOB: [birth year]  Provider: Deetta PerlaHICKLING,Aldene Hendon H, MD [facility]  Note type: Routine return visit  History of Present Illness: [facility] Elwyn ReachBooth History from: mother and [facility] Chief Complaint: Seizures/Spastic Quadriparesis  Kristin Fitzgerald is a 24 y.o. female who was evaluated August 26, 2014, for the first time since July 08, 2013.  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  She is able to communicate her wants and needs and remains dysarthric, but intelligible.  Her mother was finally able [place] secure [place]  She comes four hours a day five days a week, which allows mother to work.  The patient's brother watches her four other hours a day, so that mother can work a full workday.  This has been good both for Annice PihJackie and for her mother.  In general, her health has been good since last visit.  She had a chest x-ray to rule out pneumonia when she developed persistent cough.  She remains wheelchair bound.  She has limited independence for activities of daily living, but is certainly aware of her environment and is able to communicate wants and needs.  Her seizures have been well controlled on a combination of Carbatrol and gabapentin both prescriptions needed to be refilled today.  Mother expressed no other concerns today.  Review of Systems: 12 system review was unremarkable  Past Medical History Diagnosis Date  . Cerebral palsy   . Seizure disorder   . Seizures   .  Mild intellectual disabilities    Hospitalizations: No., Head Injury: No., Nervous System Infections: No., Immunizations up to date: Yes.    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  Birth History 3 lbs. 1 oz. Infant born at 7530 weeks gestational age  Gestation was complicated by premature delivery for unknown reasons. Nursery Course was complicated by seizures a day 3 of life. The patient had no hemorrhage but had periventricular leukomalacia. Growth and Development was recalled as abnormal  Behavior History none  Surgical History Procedure Laterality Date  . Cardiac surgery      Repaired valve in 2007-[place]  . 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 Hip arthroplasty      for recurrent dislocation; rebuilt acetabulum  . Inguinal hernia repair    . Other surgical history      [facility] as an infant   Family History family history includes Asthma in her brother; Breast cancer in her maternal aunt; Kidney failure in her maternal grandmother; Leukemia in her maternal grandfather. Family history is negative for migraines, seizures, intellectual disabilities, blindness, deafness, birth defects, chromosomal disorder, or autism.  Social History . Marital Status: Single    Spouse Name: N/A  . Number of Children: N/A  . Years of Education: N/A   Social History Main Topics  . Smoking status: Passive Smoke Exposure - Never Smoker  . Smokeless tobacco: Never Used  Comment: Mother and brother smoke   . Alcohol Use: No  . Drug Use: No  . Sexual Activity: No   Social History Narrative    Lives with her mother and 13 year old brother Berna Spare.  Attends [employer]. exercises daily.  Never sexually active.  No drug use no smoking no ETOH.  Very supportive family and excellent care.       Graduating high school in 11/2011.    Educational level 12th grade Living with mother and brother  Hobbies/Interest: Enjoys listening to music and coloring. School comments [name] completed her education at [employer] in 2013.  Allergies Allergen Reactions  . Demerol Nausea And Vomiting   Physical Exam BP 96/56 mmHg  Pulse 96  Ht   Wt   LMP 08/25/2014 (Exact Date)  General: alert, well developed, well nourished, in no acute distress, black hair, brown eyes, left handed Head: normocephalic, no dysmorphic features Ears, Nose and Throat: Otoscopic: Tympanic membranes normal. Pharynx: oropharynx is pink without exudates or tonsillar hypertrophy. Neck: supple, full range of motion, no cranial or cervical bruits Respiratory: auscultation clear Cardiovascular: no murmurs, pulses are normal Musculoskeletal: Flexion contractures at the right elbow, both knees, tight heel cords, neuromuscular scoliosis convex left, right hemiatrophy involving the arm more than the leg Skin: no rashes or neurocutaneous lesions  Neurologic Exam  Mental Status: alert; oriented to person, place and year; knowledge is below normal for age; language is normal, Speech is dysarthric but intelligible able to name objects and follow commands. Cranial Nerves: visual fields are full to double simultaneous stimuli; extraocular movements are full but dysconjugate, left exotropia with amblyopia; pupils are round reactive to light; funduscopic examination shows sharp disc margins with normal vessels; symmetric facial strength; midline tongue and uvula; air conduction is greater than bone conduction bilaterally. Motor: Asymmetrical strength. The patient has not able to reach out as far with the right arm as the left.She has dystonic posturing of her fingers on the right. He has significant clumsiness in both hands and is unable to bring her thumb past her index finger or to touch the tip of either index finger. Strength  is 4/5 in the right arm, 4+ over 5 in left arm, 1-2/5 in the right leg, 3/5 in the left leg at the knee, I could not get her to spontaneously move either foot. She has significant limitation of range of motion in her right hip. Sensory: intact responses to cold, vibration, proprioception and stereognosis Coordination: Clumsy, unable to be tested Gait and Station: Wheelchair bound Reflexes: symmetric and normal bilaterally; no clonus; bilateral extensor plantar responses.  Assessment 1. 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 2. Generalized convulsive epilepsy, G40.309. 3. 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 4. Mild intellectual disability, F70. 5. Heart murmur (sounds like a VSD), R01.1.  Discussion The patient remains neurologically stable.  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  She is getting out of the house now, which is a good thing.  Her seizures have been well controlled.  Plan Prescriptions were refilled for Carbatrol and gabapentin.  I am not going to make changes in those medicines.  She will return in one-year for routine evaluation.  I will be happy to see her sooner depending upon clinical need.  I spent one half hour of face-to-face time with [name] and her mother, more than half of it in consultation.   Medication List  This list is accurate as of: 08/26/14 11:59 PM.       acetaminophen 160 MG/5ML solution  Commonly known as:  TYLENOL  Take 320 mg by mouth every 6 (six) hours as needed for mild pain.     CARBATROL 200 MG 12 hr capsule  Generic drug:  carbamazepine  TAKE ONE CAPSULE BY MOUTH EVERY MORNING AND TAKE ONE CAPSULE BY MOUTH EVERY EVENING     cetirizine HCl 5 MG/5ML Syrp  Commonly known as:  Zyrtec  Take 10 mLs (10 mg total) by mouth daily.     gabapentin 100 MG capsule  Commonly known as:  NEURONTIN  1 capsule (100 mg) every AM and  2 capsules (200 mg) every day at bedtime     ipratropium 0.06 % nasal spray  Commonly known as:  ATROVENT  Place 2 sprays into both nostrils 4 (four) times daily.      The medication list was reviewed and reconciled. All changes or newly prescribed medications were explained.  A complete medication list was provided to the patient/caregiver.  Deetta Perla MD

## 2014-09-12 ENCOUNTER — Other Ambulatory Visit: Payer: Self-pay | Admitting: Family

## 2015-03-16 ENCOUNTER — Other Ambulatory Visit: Payer: Self-pay | Admitting: Family

## 2015-03-23 ENCOUNTER — Emergency Department (HOSPITAL_COMMUNITY): Payer: Medicaid Other

## 2015-03-23 ENCOUNTER — Emergency Department (HOSPITAL_COMMUNITY)
Admission: EM | Admit: 2015-03-23 | Discharge: 2015-03-23 | Disposition: A | Discharge status: Home / Self Care | Payer: Medicaid Other | Attending: Emergency Medicine | Admitting: Emergency Medicine

## 2015-03-23 ENCOUNTER — Encounter (HOSPITAL_COMMUNITY): Payer: Self-pay

## 2015-03-23 DIAGNOSIS — R0982 Postnasal drip: Secondary | ICD-10-CM | POA: Diagnosis not present

## 2015-03-23 DIAGNOSIS — Z8659 Personal history of other mental and behavioral disorders: Secondary | ICD-10-CM | POA: Diagnosis not present

## 2015-03-23 DIAGNOSIS — R067 Sneezing: Secondary | ICD-10-CM | POA: Diagnosis not present

## 2015-03-23 DIAGNOSIS — R0981 Nasal congestion: Secondary | ICD-10-CM

## 2015-03-23 DIAGNOSIS — R059 Cough, unspecified: Secondary | ICD-10-CM

## 2015-03-23 DIAGNOSIS — Z79899 Other long term (current) drug therapy: Secondary | ICD-10-CM | POA: Insufficient documentation

## 2015-03-23 DIAGNOSIS — R05 Cough: Secondary | ICD-10-CM | POA: Diagnosis not present

## 2015-03-23 DIAGNOSIS — G40909 Epilepsy, unspecified, not intractable, without status epilepticus: Secondary | ICD-10-CM | POA: Diagnosis not present

## 2015-03-23 DIAGNOSIS — J3489 Other specified disorders of nose and nasal sinuses: Secondary | ICD-10-CM | POA: Insufficient documentation

## 2015-03-23 MED ORDER — FLUTICASONE PROPIONATE 50 MCG/ACT NA SUSP: 2.0000 | Freq: Every day | NASAL | Status: DC

## 2015-03-23 NOTE — ED Provider Notes (Signed)
CSN: [id number]     Arrival date & time 03/23/15  0132 History  By signing my name below, I, [name], attest that this documentation has been prepared under the direction and in the presence of April Palumbo, MD. Electronically Signed: [name], ED Scribe. 03/23/2015. 3:39 AM.     Chief Complaint  Patient presents with  . Cough   Patient is a 24 y.o. female presenting with cough. The history is provided by the patient. No language interpreter was used.  Cough Cough characteristics:  Non-productive Severity:  Moderate Onset quality:  Gradual Duration:  2 days Timing:  Intermittent Progression:  Unchanged Chronicity:  New Smoker: no   Context: exposure to allergens   Relieved by:  Nothing Worsened by:  Nothing tried Ineffective treatments: zyrtec. Associated symptoms: rhinorrhea and sinus congestion   Associated symptoms: no chest pain, no chills, no fever and no shortness of breath   Risk factors: no recent travel    HPI Comments: Kristin Fitzgerald is a 24 y.o. female with PMHx listed below who presents to the Emergency Department complaining of persistent cough onset 2 days prior. Pt mother states she has associated rhinorrhea and sneezing. Mother states that she has ben complaining of CP as well. Mother denies any recent sick contacts. Mother states she initially thought she was having post nasal drainage causing her to have a cough. Mother states she has been giving her Zyrtec with no relief. Mother denies fever or other related symptoms.   Past Medical History  Diagnosis Date  . Cerebral palsy   . Seizure disorder   . Seizures   . Mild intellectual disabilities    Past Surgical History  Procedure Laterality Date  . Cardiac surgery      Repaired valve in 2007-[place]  . 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 Hip arthroplasty      for recurrent dislocation;  rebuilt acetabulum  . Inguinal hernia repair    . Other surgical history      [facility] as an infant   Family History  Problem Relation Age of Onset  . Asthma Brother   . Breast cancer Maternal Aunt     Dx in 2010, In remission  . Kidney failure Maternal Grandmother     Died at 27  . Leukemia Maternal Grandfather     Died at 38   Social History  Substance Use Topics  . Smoking status: Passive Smoke Exposure - Never Smoker  . Smokeless tobacco: Never Used     Comment: Mother and brother smoke   . Alcohol Use: No   OB History    No data available     Review of Systems  Constitutional: Negative for fever and chills.  HENT: Positive for postnasal drip, rhinorrhea and sneezing.   Respiratory: Positive for cough. Negative for shortness of breath.   Cardiovascular: Negative for chest pain, palpitations and leg swelling.  All other systems reviewed and are negative.     Allergies  Demerol  Home Medications   Prior to Admission medications   Medication Sig Start Date End Date Taking? Authorizing Provider  acetaminophen (TYLENOL) 160 MG/5ML solution Take 320 mg by mouth every 6 (six) hours as needed for mild pain.    Historical Provider, MD  CARBATROL 200 MG 12 hr capsule TAKE ONE CAPSULE BY MOUTH EVERY MORNING  AND TAKE ONE CAPSULE BY MOUTH EVERY EVENING 08/26/14   Deetta Perla, MD  cetirizine HCl (ZYRTEC) 5 MG/5ML SYRP Take 10 mLs (10 mg total) by mouth daily. 06/27/14   Charm Rings, MD  gabapentin (NEURONTIN) 100 MG capsule TAKE ONE CAPSULE BY MOUTH EVERY MORNING AND TAKE 2 CAPSULES BY MOUTH AT BEDTIME 03/16/15   Elveria Rising, NP  ipratropium (ATROVENT) 0.06 % nasal spray Place 2 sprays into both nostrils 4 (four) times daily. 07/27/14   Erasmo Downer, MD   BP 138/94 mmHg  Pulse 108  Temp(Src) 98.2 F (36.8 C) (Oral)  Resp 20  SpO2 97%  LMP 03/18/2015   Physical Exam  Constitutional: She appears well-developed and well-nourished. No distress.   HENT:  Head: Normocephalic and atraumatic.  Right Ear: Tympanic membrane normal.  Left Ear: Tympanic membrane normal.  Nasal congestion. Clear colorless post nasal drip  Eyes: Conjunctivae and EOM are normal. Pupils are equal, round, and reactive to light.  Neck: Normal range of motion. Neck supple. No tracheal deviation present.  Cardiovascular: Normal rate, regular rhythm and normal heart sounds.   Pulmonary/Chest: Effort normal. No stridor. No respiratory distress. She has no wheezes. She has no rales.  Abdominal: Soft. Bowel sounds are normal. There is no tenderness. There is no rebound and no guarding.  Musculoskeletal: Normal range of motion. She exhibits no edema or tenderness.  Neurological: She is alert.  Skin: Skin is warm and dry.  Psychiatric: She has a normal mood and affect. Her behavior is normal.  Nursing note and vitals reviewed.   ED Course  Procedures (including critical care time) DIAGNOSTIC STUDIES: Oxygen Saturation is 97% on RA, normal by my interpretation.    COORDINATION OF CARE: 3:41 AM-Discussed treatment plan with pt at bedside and pt agreed to plan.     Labs Review Labs Reviewed - No data to display  Imaging Review Dg Chest 2 View  03/23/2015   CLINICAL DATA:  Cough.  EXAM: CHEST  2 VIEW  COMPARISON:  06/22/2014  FINDINGS: Chronic hypoventilation with minimal right basilar atelectasis. No consolidation or edema. No effusion or pneumothorax. Median sternotomy with stable heart size and mediastinal contours. Thoracolumbar scoliosis.  IMPRESSION: Hypoventilation with mild atelectasis.   Electronically Signed   By: Marnee Spring M.D.   On: 03/23/2015 02:14   I have personally reviewed and evaluated these images and lab results as part of my medical decision-making.   EKG Interpretation   Date/Time:  Tuesday March 23 2015 01:39:44 EDT Ventricular Rate:  103 PR Interval:  128 QRS Duration: 88 QT Interval:  342 QTC Calculation: 448 R Axis:    74 Text Interpretation:  Sinus tachycardia Confirmed by [facility]  MD,  Morene Antu ([id number]) on 03/23/2015 3:33:59 AM      MDM   Final diagnoses:  None     Will start flonase and have patient follow up with her PMD  I personally performed the services described in this documentation, which was scribed in my presence. The recorded information has been reviewed and is accurate.       Cy Blamer, MD 03/23/15 [phone]

## 2015-03-23 NOTE — Discharge Instructions (Signed)
Cool Mist Vaporizers °Vaporizers may help relieve the symptoms of a cough and cold. They add moisture to the air, which helps mucus to become thinner and less sticky. This makes it easier to breathe and cough up secretions. Cool mist vaporizers do not cause serious burns like hot mist vaporizers, which may also be called steamers or humidifiers. Vaporizers have not been proven to help with colds. You should not use a vaporizer if you are allergic to mold. °HOME CARE INSTRUCTIONS °· Follow the package instructions for the vaporizer. °· Do not use anything other than distilled water in the vaporizer. °· Do not run the vaporizer all of the time. This can cause mold or bacteria to grow in the vaporizer. °· Clean the vaporizer after each time it is used. °· Clean and dry the vaporizer well before storing it. °· Stop using the vaporizer if worsening respiratory symptoms develop. °Document Released: 03/09/2004 Document Revised: 06/17/2013 Document Reviewed: 10/30/2012 °ExitCare® Patient Information ©2015 [employer]. This information is not intended to replace advice given to you by your health care provider. Make sure you discuss any questions you have with your health care provider. ° °

## 2015-03-23 NOTE — ED Notes (Signed)
Pts mother reports patient has had a non-productive cough for 2 days associated with runny nose and sneezing. Pt mother states she thinks her chest has been hurting her because she keeping pointing to her chest. He has cerebral palsy and is wheelchair bound.

## 2015-04-07 ENCOUNTER — Other Ambulatory Visit: Payer: Self-pay | Admitting: Pediatrics

## 2015-04-08 ENCOUNTER — Other Ambulatory Visit: Payer: Self-pay | Admitting: Pediatrics

## 2015-06-07 ENCOUNTER — Ambulatory Visit (INDEPENDENT_AMBULATORY_CARE_PROVIDER_SITE_OTHER): Payer: Medicaid Other | Admitting: *Deleted

## 2015-06-07 DIAGNOSIS — Z23 Encounter for immunization: Secondary | ICD-10-CM

## 2015-06-17 ENCOUNTER — Encounter: Payer: Self-pay | Admitting: Family Medicine

## 2015-06-17 ENCOUNTER — Ambulatory Visit (INDEPENDENT_AMBULATORY_CARE_PROVIDER_SITE_OTHER): Payer: Medicaid Other | Admitting: Family Medicine

## 2015-06-17 VITALS — BP 108/48 | HR 106 | Temp 98.8°F

## 2015-06-17 DIAGNOSIS — H6691 Otitis media, unspecified, right ear: Secondary | ICD-10-CM

## 2015-06-17 MED ORDER — AMOXICILLIN 250 MG PO CHEW: 500.0000 mg | CHEWABLE_TABLET | Freq: Two times a day (BID) | ORAL | Status: DC

## 2015-06-17 NOTE — Patient Instructions (Signed)
Take amoxicillin 500mg  (2 tablets) twice daily for 7 days.  Otitis Media, Adult Otitis media is redness, soreness, and inflammation of the middle ear. Otitis media may be caused by allergies or, most commonly, by infection. Often it occurs as a complication of the common cold. SIGNS AND SYMPTOMS Symptoms of otitis media may include:  Earache.  Fever.  Ringing in your ear.  Headache.  Leakage of fluid from the ear. DIAGNOSIS To diagnose otitis media, your health care provider will examine your ear with an otoscope. This is an instrument that allows your health care provider to see into your ear in order to examine your eardrum. Your health care provider also will ask you questions about your symptoms. TREATMENT  Typically, otitis media resolves on its own within 3-5 days. Your health care provider may prescribe medicine to ease your symptoms of pain. If otitis media does not resolve within 5 days or is recurrent, your health care provider may prescribe antibiotic medicines if he or she suspects that a bacterial infection is the cause. HOME CARE INSTRUCTIONS   If you were prescribed an antibiotic medicine, finish it all even if you start to feel better.  Take medicines only as directed by your health care provider.  Keep all follow-up visits as directed by your health care provider. SEEK MEDICAL CARE IF:  You have otitis media only in one ear, or bleeding from your nose, or both.  You notice a lump on your neck.  You are not getting better in 3-5 days.  You feel worse instead of better. SEEK IMMEDIATE MEDICAL CARE IF:   You have pain that is not controlled with medicine.  You have swelling, redness, or pain around your ear or stiffness in your neck.  You notice that part of your face is paralyzed.  You notice that the bone behind your ear (mastoid) is tender when you touch it. MAKE SURE YOU:   Understand these instructions.  Will watch your condition.  Will get help  right away if you are not doing well or get worse.   This information is not intended to replace advice given to you by your health care provider. Make sure you discuss any questions you have with your health care provider.   Document Released: 03/17/2004 Document Revised: 07/03/2014 Document Reviewed: 01/07/2013 Elsevier Interactive Patient Education [employer]2016 Elsevier Inc.

## 2015-06-17 NOTE — Progress Notes (Signed)
Patient ID: Kristin Fitzgerald, female   DOB: [birth year], 24 y.o.   MRN: [id number]    Subjective: CC:  HPI: Patient is a 24 y.o. female with a past medical history of cerebral pralsy presenting to clinic today for cough and congestion.  Mother notes that the patient has had nasal congestion and "itchy nose" for 3-4 days. She started having a dry cough yesterday. She started having R ear pain today as well as a headache. Headache is frontally located. No vision change.  No fevers, sore throat. No dysphagia or drooling. She continues to eat and drink normally.   Used Flonase for 3 days. She took children's ibuprofen around 12:30 today due to discomfort. Not other medications.  She cannot take pills.   She's been around sick children who had pneumonia and strep throat.  Social History: no smoke exposure   Health Maintenance: up to date on flu vaccine.   ROS: All other systems reviewed and are negative.  Past Medical History Patient Active Problem List   Diagnosis Date Noted  . Otitis media 06/21/2015  . Excessive cerumen in both ear canals 04/21/2014  . 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  . 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 Scoliosis associated with other condition 05/19/2013  . Unspecified disorder of eye movements 05/19/2013  . 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 Unspecified urinary incontinence 05/19/2013  . Unspecified congenital cataract 05/19/2013  . Cerebral palsy (HCC) 07/06/2011  . Seizure disorder (HCC) 07/06/2011  . Heart murmur 07/06/2011  . Healthcare maintenance 07/06/2011    Medications- reviewed and updated Current Outpatient Prescriptions  Medication Sig Dispense Refill  . acetaminophen (TYLENOL) 160 MG/5ML solution Take 320 mg by mouth every 6 (six) hours as needed for mild pain.    Marland Kitchen amoxicillin (AMOXIL) 250 MG chewable tablet Chew 2 tablets (500 mg  total) by mouth 2 (two) times daily. 28 tablet 0  . CARBATROL 200 MG 12 hr capsule TAKE ONE CAPSULE BY MOUTH EVERY MORNING TAKE ONE CAPSULE BY MOUTH EVERY EVENING 62 capsule 5  . cetirizine HCl (ZYRTEC) 5 MG/5ML SYRP Take 10 mLs (10 mg total) by mouth daily. 300 mL 1  . fluticasone (FLONASE) 50 MCG/ACT nasal spray Place 2 sprays into both nostrils daily. 16 g 0  . gabapentin (NEURONTIN) 100 MG capsule TAKE ONE CAPSULE BY MOUTH EVERY MORNING AND TAKE 2 CAPSULES BY MOUTH AT BEDTIME 93 capsule 5  . ipratropium (ATROVENT) 0.06 % nasal spray Place 2 sprays into both nostrils 4 (four) times daily. 15 mL 3   No current facility-administered medications for this visit.    Objective: Office vital signs reviewed. BP 108/48 mmHg  Pulse 106  Temp(Src) 98.8 F (37.1 C) (Oral)  Wt   SpO2 100%   Physical Examination:  General: Awake, alert, well- nourished, NAD sitting in wheel chair.  ENMT:  R TMs occluded, performed irrigation. R TM intact erythematous, and bulging. Left TM erythematous, non-bulging.  Nasal turbinates moist. MMM, Oropharynx clear without erythema or tonsillar exudate/hypertrophy Eyes: Conjunctiva non-injected. PERRL.  Cardio: RRR, diastolic heart murmur. No rubs or .  Pulm: No increased WOB.  CTAB, without wheezes, rhonchi or crackles noted.   Assessment/Plan: Otitis media Patient with a h/o CP presenting with cough, nasal congestion, and otalgia of the R ear found to have acute otitis media on exam. She is non-toxic on exam and continues to eat and dirnk well. - Rx for amoxicillin  chewable tablets  BID x 7 days  - Discussed nasal saline as needed for nasal congestion, humidifiers as needed. - RTC precautions discussed: worsening or new symptoms.     No orders of the defined types were placed in this encounter.    Meds ordered this encounter  Medications  . amoxicillin (AMOXIL) 250 MG chewable tablet    Sig: Chew 2 tablets (500 mg total) by mouth 2 (two) times daily.     Dispense:  28 tablet    Refill:  0    Joanna Puffrystal S. Kayde Atkerson PGY-2, [facility]

## 2015-06-21 DIAGNOSIS — H6690 Otitis media, unspecified, unspecified ear: Secondary | ICD-10-CM | POA: Insufficient documentation

## 2015-06-21 NOTE — Assessment & Plan Note (Signed)
Patient with a h/o CP presenting with cough, nasal congestion, and otalgia of the R ear found to have acute otitis media on exam. She is non-toxic on exam and continues to eat and dirnk well. - Rx for amoxicillin 500mg  chewable tablets BID x 7 days  - Discussed nasal saline as needed for nasal congestion, humidifiers as needed. - RTC precautions discussed: worsening or new symptoms.

## 2015-09-16 ENCOUNTER — Other Ambulatory Visit: Payer: Self-pay | Admitting: Family

## 2015-10-11 ENCOUNTER — Other Ambulatory Visit: Payer: Self-pay | Admitting: Family

## 2015-10-20 ENCOUNTER — Ambulatory Visit
Admission: RE | Admit: 2015-10-20 | Discharge: 2015-10-20 | Disposition: A | Discharge status: Home / Self Care | Payer: Medicaid Other | Source: Ambulatory Visit | Attending: Pediatrics | Admitting: Pediatrics

## 2015-10-20 ENCOUNTER — Encounter: Payer: Self-pay | Admitting: Pediatrics

## 2015-10-20 ENCOUNTER — Ambulatory Visit (INDEPENDENT_AMBULATORY_CARE_PROVIDER_SITE_OTHER): Payer: Medicaid Other | Admitting: Pediatrics

## 2015-10-20 VITALS — BP 90/50 | HR 88

## 2015-10-20 DIAGNOSIS — G40309 Generalized idiopathic epilepsy and epileptic syndromes, not intractable, without status epilepticus: Secondary | ICD-10-CM

## 2015-10-20 DIAGNOSIS — M4145 Neuromuscular scoliosis, thoracolumbar region: Secondary | ICD-10-CM | POA: Diagnosis not present

## 2015-10-20 DIAGNOSIS — G808 Other cerebral palsy: Secondary | ICD-10-CM | POA: Diagnosis not present

## 2015-10-20 DIAGNOSIS — G40209 Localization-related (focal) (partial) symptomatic epilepsy and epileptic syndromes with complex partial seizures, not intractable, without status epilepticus: Secondary | ICD-10-CM

## 2015-10-20 MED ORDER — CARBATROL 200 MG PO CP12: ORAL_CAPSULE | ORAL | Status: DC

## 2015-10-20 MED ORDER — GABAPENTIN 100 MG PO CAPS: ORAL_CAPSULE | ORAL | Status: DC

## 2015-10-20 NOTE — Progress Notes (Signed)
Patient: Kristin Fitzgerald MRN: [id number] Sex: female DOB: 12/08/1990  Provider: Deetta Perla, MD Location of Care: [facility]  Note type: Routine return visit  History of Present Illness: Referral Source: [facility]/Dr. Elwyn Reach History from: mother, patient and [facility] chart Chief Complaint: Seizures/Spastic Quadriparesis  Kristin Fitzgerald is a 25 y.o. female who was evaluated on October 20, 2015, for the first time since August 26, 2014.  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  She returns today with her mother.  Her mother works 40 hours a week and receives assistance 75 hours per month.  This works out two to three hours per day.  Her brother stays with her during the day until assistance comes.  He has a part-time job, but apparently has his own child to support.  I do not know if he is married.  He is now 27 years of age.  He is not a high school graduate.  His mother believes that he needs to get on with his life and that can happen until such time as she has more help.  She works 40 hours a week at a childcare center.  Because Kristin Fitzgerald has not received CAPS, she is not eligible for a day program, which is unfortunate because she would definitely benefit from one.  Mother is looking into a program run by [employer].  I do not know anything about it.  I do not recall when her last seizure occurred, neither did her mother.  It has been a very long time.  She ambulates in public with a wheelchair.  At home, she is able to combat crawl.  Mother encourages this because it is good for her physical exercise.  She is able to independently feed herself and help dress.  She is not able to take care of  hygiene.  Her general health is good.  She is sleeping well.  It does not appear to be that she has gained a significant amount of weight.  Review of Systems: 12 system review was assessed and except as noted above was negative  Past Medical History Diagnosis Date  . Cerebral palsy (HCC)   . Seizure disorder (HCC)   . Seizures (HCC)   . Mild intellectual disabilities    Hospitalizations: Yes.  , Head Injury: No., Nervous System Infections: No., Immunizations up to date: Yes.    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  Birth History 3 lbs. 1 oz. Infant born at [redacted] weeks gestational age  Gestation was complicated by premature delivery for unknown reasons. Nursery Course was complicated by seizures a day 3 of life. The patient had no hemorrhage but had periventricular leukomalacia. Growth and Development was recalled as abnormal  Behavior History none  Surgical History Procedure Laterality Date  . Cardiac surgery      Repaired valve in 2007-[place]  . 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 Hip arthroplasty      for recurrent dislocation; rebuilt acetabulum  . Inguinal hernia repair    . Other surgical history      [facility]  as an infant   Family History family history includes Asthma in her brother; Breast cancer in her maternal aunt; Kidney failure in her maternal grandmother; Leukemia in her maternal grandfather. Family history is negative for migraines, seizures, intellectual disabilities, blindness, deafness, birth defects, chromosomal disorder, or autism.  Social History . Marital Status: Single    Spouse Name: N/A  . Number of Children: N/A  . Years of Education: N/A   Social History Main Topics  . Smoking status: Passive Smoke Exposure - Never Smoker  . Smokeless tobacco:  Never Used     Comment: Mother and brother smoke   . Alcohol Use: No  . Drug Use: No  . Sexual Activity: No   Social History Narrative     Lives with her mother and 21 year old brother Kristin Fitzgerald.  Attends [employer]. exercises daily.  Never sexually active.  No drug use no smoking no ETOH.  Very supportive family and excellent care.    Graduating high school in 11/2011.     Allergies Allergen Reactions  . Demerol Nausea And Vomiting   Physical Exam BP 90/50 mmHg  Pulse 88  LMP 10/08/2015 (Exact Date)  General: alert, well developed, well nourished, in no acute distress, black hair, brown eyes, left handed Head: normocephalic, no dysmorphic features Ears, Nose and Throat: Otoscopic: Tympanic membranes normal. Pharynx: oropharynx is pink without exudates or tonsillar hypertrophy. Neck: supple, full range of motion, no cranial or cervical bruits Respiratory: auscultation clear Cardiovascular: no murmurs, pulses are normal Musculoskeletal: Flexion contractures at the right elbow, both knees, tight heel cords, neuromuscular scoliosis convex left, right hemiatrophy involving the arm more than the leg Skin: no rashes or neurocutaneous lesions  Neurologic Exam  Mental Status: alert; oriented to person, place and year; knowledge is below normal for age; language is normal, Speech is dysarthric but intelligible able to name objects and follow commands. Cranial Nerves: visual fields are full to double simultaneous stimuli; extraocular movements are full but dysconjugate, left exotropia with amblyopia; pupils are round reactive to light; funduscopic examination shows sharp disc margins with normal vessels; symmetric facial strength; midline tongue and uvula; air conduction is greater than bone conduction bilaterally. Motor: Asymmetrical strength. The patient was not able to reach out as far with the right arm as the left.  She has dystonic posturing of her fingers on the right. She has  significant clumsiness in both hands and is unable to bring her thumb past her index finger or to touch the tip of either index finger. Strength is 4/5 in the right arm, 4+ over 5 in left arm, 1-2/5 in the right leg, 3/5 in the left leg at the knee, I could not get her to spontaneously move either foot. She has significant limitation of range of motion in her right hip. Sensory: intact responses to cold, vibration, proprioception and stereognosis Coordination: Clumsy, unable to be tested Gait and Station: Wheelchair bound Reflexes: symmetric and normal bilaterally; no clonus; bilateral extensor plantar responses  Assessment 1. Partial epilepsy with impairment of consciousness, G40.209. 2. Generalized convulsive epilepsy, G40.309. 3. 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 4. 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  Discussion Kristin Fitzgerald is stable.  There is no reason to change her antiepileptic medicines.  I was concerned about her scoliosis and ordered plain films of her back.  I reviewed the films and it shows mild S-shaped scoliosis in her thoracic region that is not significant.  She does have a somewhat smaller lung on the right than  the left, but it is stable.  Her major scoliotic curve recurs in the thoracolumbar region between T11 and T12 and L4-L5 with a Levoconvex scoliosis of 94 degrees.  Significant rotatory component of the spine beginning at T10 level and continuing through the lumbar spine.  This is unchanged since 2015.  I was most concerned about further compromising her lung, but the thoracic spine is relatively straight in comparison with the lumbosacral.  I do not think that if there is any reason to worry about further deterioration of the thoracic spine.  I do not think if there is anything that can be done about the lumbosacral spine.  Plan Prescriptions were refilled for Carbatrol and for gabapentin.  She will return to see me in six months' time.  I spent 30  minutes of face-to-face time with Kristin PihJackie and her mother, more than half of it in consultation.   Medication List   This list is accurate as of: 10/20/15 11:59 PM.       CARBATROL 200 MG 12 hr capsule  Generic drug:  carbamazepine  TAKE ONE CAPSULE BY MOUTH EVERY MORNING AND TAKE ONE CAPSULE BY MOUTH EVERY EVENING     cetirizine HCl 5 MG/5ML Syrp  Commonly known as:  Zyrtec  Take 10 mLs (10 mg total) by mouth daily.     fluticasone 50 MCG/ACT nasal spray  Commonly known as:  FLONASE  Place 2 sprays into both nostrils daily.     gabapentin 100 MG capsule  Commonly known as:  NEURONTIN  TAKE ONE CAPSULE BY MOUTH EVERY MORNING AND TAKE 2 CAPSULES BY MOUTH EVERY DAY AT BEDTIME      The medication list was reviewed and reconciled. All changes or newly prescribed medications were explained.  A complete medication list was provided to the patient/caregiver.  Deetta PerlaWilliam H Hickling MD

## 2015-10-22 ENCOUNTER — Telehealth: Payer: Self-pay | Admitting: Pediatrics

## 2015-10-22 NOTE — Telephone Encounter (Signed)
The curvature in the thoracic region is not significant.  The curvature in the lumbosacral region is beyond repair.

## 2015-10-23 ENCOUNTER — Encounter (HOSPITAL_COMMUNITY): Payer: Self-pay | Admitting: Emergency Medicine

## 2015-10-23 ENCOUNTER — Emergency Department (HOSPITAL_COMMUNITY)
Admission: EM | Admit: 2015-10-23 | Discharge: 2015-10-23 | Disposition: A | Discharge status: Home / Self Care | Payer: Medicaid Other | Attending: Emergency Medicine | Admitting: Emergency Medicine

## 2015-10-23 DIAGNOSIS — G809 Cerebral palsy, unspecified: Secondary | ICD-10-CM | POA: Diagnosis not present

## 2015-10-23 DIAGNOSIS — R1310 Dysphagia, unspecified: Secondary | ICD-10-CM

## 2015-10-23 DIAGNOSIS — T17208A Unspecified foreign body in pharynx causing other injury, initial encounter: Secondary | ICD-10-CM | POA: Diagnosis present

## 2015-10-23 DIAGNOSIS — W458XXA Other foreign body or object entering through skin, initial encounter: Secondary | ICD-10-CM | POA: Insufficient documentation

## 2015-10-23 DIAGNOSIS — Y9389 Activity, other specified: Secondary | ICD-10-CM | POA: Insufficient documentation

## 2015-10-23 DIAGNOSIS — Z7952 Long term (current) use of systemic steroids: Secondary | ICD-10-CM | POA: Diagnosis not present

## 2015-10-23 DIAGNOSIS — Z79899 Other long term (current) drug therapy: Secondary | ICD-10-CM | POA: Insufficient documentation

## 2015-10-23 DIAGNOSIS — Y929 Unspecified place or not applicable: Secondary | ICD-10-CM | POA: Insufficient documentation

## 2015-10-23 DIAGNOSIS — G40909 Epilepsy, unspecified, not intractable, without status epilepticus: Secondary | ICD-10-CM | POA: Diagnosis not present

## 2015-10-23 DIAGNOSIS — Z7722 Contact with and (suspected) exposure to environmental tobacco smoke (acute) (chronic): Secondary | ICD-10-CM | POA: Diagnosis not present

## 2015-10-23 DIAGNOSIS — Y999 Unspecified external cause status: Secondary | ICD-10-CM | POA: Diagnosis not present

## 2015-10-23 MED ORDER — GI COCKTAIL ~~LOC~~
30.0000 mL | Freq: Once | ORAL | Status: AC
  Administered 2015-10-23: 30 mL via ORAL
  Filled 2015-10-23: qty 30

## 2015-10-23 NOTE — ED Provider Notes (Signed)
CSN: [id number]     Arrival date & time 10/23/15  1540 History   First MD Initiated Contact with Patient 10/23/15 1613     Chief Complaint  Patient presents with  . Foreign Body in Throat     Level 5 caveat: Cerebral palsy  HPI Patient stressed emergency department after acutely choking on chicken nuggets today.  Patient is brought to the emergency department by her mother provides the majority of the information.  Mother reports that the patient's had increasing difficulty swallowing foods his had multiple choking episodes.  She feels that maybe her daughter needs to chew her food better.  She was able to stick her finger down her child's throat making her gag.  Patient's been able to keep fluids down since the event.  No reported nausea or vomiting.  No prior history of endoscopy.  Patient is never seeing gastroenterology.  Patient states she feels much better at this time   Past Medical History  Diagnosis Date  . Cerebral palsy (HCC)   . Seizure disorder (HCC)   . Seizures (HCC)   . Mild intellectual disabilities    Past Surgical History  Procedure Laterality Date  . Cardiac surgery      Repaired valve in 2007-[place]  . 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 Hip arthroplasty      for recurrent dislocation; rebuilt acetabulum  . Inguinal hernia repair    . Other surgical history      [facility] as an infant   Family History  Problem Relation Age of Onset  . Asthma Brother   . Breast cancer Maternal Aunt     Dx in 2010, In remission  . Kidney failure Maternal Grandmother     Died at 22  . Leukemia Maternal Grandfather     Died at 29   Social History  Substance Use Topics  . Smoking status: Passive Smoke Exposure - Never Smoker  . Smokeless tobacco: Never Used     Comment: Mother and brother smoke   . Alcohol Use: No   OB History    No data available     Review  of Systems  All other systems reviewed and are negative.     Allergies  Demerol  Home Medications   Prior to Admission medications   Medication Sig Start Date End Date Taking? Authorizing Provider  CARBATROL 200 MG 12 hr capsule TAKE ONE CAPSULE BY MOUTH EVERY MORNING AND TAKE ONE CAPSULE BY MOUTH EVERY EVENING Patient taking differently: Take 200 mg by mouth 2 (two) times daily.  10/20/15  Yes Deetta Perla, MD  fluticasone (FLONASE) 50 MCG/ACT nasal spray Place 2 sprays into both nostrils daily. 03/23/15  Yes April Palumbo, MD  gabapentin (NEURONTIN) 100 MG capsule TAKE ONE CAPSULE BY MOUTH EVERY MORNING AND TAKE 2 CAPSULES BY MOUTH EVERY DAY AT BEDTIME Patient taking differently: Take 100-200 mg by mouth 2 (two) times daily. TAKE ONE CAPSULE BY MOUTH EVERY MORNING AND TAKE 2 CAPSULES BY MOUTH EVERY DAY AT BEDTIME 10/20/15  Yes Deetta Perla, MD  cetirizine HCl (ZYRTEC) 5 MG/5ML SYRP Take 10 mLs (10 mg total) by mouth daily. Patient not taking: Reported on 10/23/2015 06/27/14   Charm Rings, MD   BP 148/88 mmHg  Pulse 95  Temp(Src) 98.6 F (37 C) (Oral)  Resp 18  SpO2 98%  LMP 10/08/2015 (Exact Date) Physical Exam  Constitutional: She is oriented to person, place, and time. She appears well-developed and well-nourished.  HENT:  Head: Normocephalic.  Eyes: EOM are normal.  Neck: Normal range of motion.  Pulmonary/Chest: Effort normal.  Abdominal: She exhibits no distension.  Musculoskeletal: Normal range of motion.  Neurological: She is alert and oriented to person, place, and time.  Psychiatric: She has a normal mood and affect.  Nursing note and vitals reviewed.   ED Course  Procedures (including critical care time) Labs Review Labs Reviewed - No data to display  Imaging Review No results found. I have personally reviewed and evaluated these images and lab results as part of my medical decision-making.   EKG Interpretation None      MDM   Final  diagnoses:  Dysphagia    Patient is able to keep fluids down this time.  Patient will need outpatient GI follow-up and may benefit from either barium swallow or endoscopy.  Until then patient replaced on a soft diet.  This will make the mother feels much more comfortable as well.  Patient is overall well-appearing at this time is without complaints.  Keeping fluids down now.    Azalia BilisKevin Inocente Krach, MD 10/23/15 [id number]

## 2015-10-23 NOTE — Discharge Instructions (Signed)
Soft-Food Meal Plan  A soft-food meal plan includes foods that are safe and easy to swallow. This meal plan typically is used:  · If you are having trouble chewing or swallowing foods.  · As a transition meal plan after only having had liquid meals for a long period.  WHAT DO I NEED TO KNOW ABOUT THE SOFT-FOOD MEAL PLAN?  A soft-food meal plan includes tender foods that are soft and easy to chew and swallow. In most cases, bite-sized pieces of food are easier to swallow. A bite-sized piece is about ½ inch or smaller. Foods in this plan do not need to be ground or pureed.  Foods that are very hard, crunchy, or sticky should be avoided. Also, breads, cereals, yogurts, and desserts with nuts, seeds, or fruits should be avoided.  WHAT FOODS CAN I EAT?  Grains  Rice and wild rice. Moist bread, dressing, pasta, and noodles. Well-moistened dry or cooked cereals, such as farina (cooked wheat cereal), oatmeal, or grits. Biscuits, breads, muffins, pancakes, and waffles that have been well moistened.  Vegetables  Shredded lettuce. Cooked, tender vegetables, including potatoes without skins. Vegetable juices. Broths or creamed soups made with vegetables that are not stringy or chewy. Strained tomatoes (without seeds).  Fruits  Canned or well-cooked fruits. Soft (ripe), peeled fresh fruits, such as peaches, nectarines, kiwi, cantaloupe, honeydew melon, and watermelon (without seeds). Soft berries with small seeds, such as strawberries. Fruit juices (without pulp).  Meats and Other Protein Sources  Moist, tender, lean beef. Mutton. Lamb. Veal. Chicken. Turkey. Liver. Ham. Fish without bones. Eggs.  Dairy  Milk, milk drinks, and cream. Plain cream cheese and cottage cheese. Plain yogurt.  Sweets/Desserts  Flavored gelatin desserts. Custard. Plain ice cream, frozen yogurt, sherbet, milk shakes, and malts. Plain cakes and cookies. Plain hard candy.   Other  Butter, margarine (without trans fat), and cooking oils. Mayonnaise. Cream  sauces. Mild spices, salt, and sugar. Syrup, molasses, honey, and jelly.  The items listed above may not be a complete list of recommended foods or beverages. Contact your dietitian for more options.  WHAT FOODS ARE NOT RECOMMENDED?  Grains  Dry bread, toast, crackers that have not been moistened. Coarse or dry cereals, such as bran, granola, and shredded wheat. Tough or chewy crusty breads, such as French bread or baguettes.  Vegetables  Corn. Raw vegetables except shredded lettuce. Cooked vegetables that are tough or stringy. Tough, crisp, fried potatoes and potato skins.  Fruits  Fresh fruits with skins or seeds or both, such as apples, pears, or grapes. Stringy, high-pulp fruits, such as papaya, pineapple, coconut, or mango. Fruit leather, fruit roll-ups, and all dried fruits.  Meats and Other Protein Sources  Sausages and hot dogs. Meats with gristle. Fish with bones. Nuts, seeds, and chunky peanut or other nut butters.  Sweets/Desserts  Cakes or cookies that are very dry or chewy.   The items listed above may not be a complete list of foods and beverages to avoid. Contact your dietitian for more information.     This information is not intended to replace advice given to you by your health care provider. Make sure you discuss any questions you have with your health care provider.     Document Released: 09/19/2007 Document Revised: 06/17/2013 Document Reviewed: 05/09/2013  Elsevier Interactive Patient Education ©2016 [employer] Inc[employer]

## 2015-10-23 NOTE — ED Notes (Signed)
Per EMS pt complaint of choking on chicken nugget; with EMS arrival pt reports coughed it up but current complaint of pain with swallowing; hx of CP.

## 2015-10-25 ENCOUNTER — Encounter (HOSPITAL_COMMUNITY): Payer: Self-pay | Admitting: Nurse Practitioner

## 2015-10-25 ENCOUNTER — Ambulatory Visit (INDEPENDENT_AMBULATORY_CARE_PROVIDER_SITE_OTHER): Payer: Medicaid Other | Admitting: Family Medicine

## 2015-10-25 ENCOUNTER — Other Ambulatory Visit (HOSPITAL_COMMUNITY): Payer: Self-pay | Admitting: Family Medicine

## 2015-10-25 ENCOUNTER — Encounter: Payer: Self-pay | Admitting: Family Medicine

## 2015-10-25 ENCOUNTER — Emergency Department (HOSPITAL_COMMUNITY)
Admission: EM | Admit: 2015-10-25 | Discharge: 2015-10-25 | Disposition: A | Discharge status: Home / Self Care | Payer: Medicaid Other | Attending: Emergency Medicine | Admitting: Emergency Medicine

## 2015-10-25 VITALS — BP 131/60 | HR 86 | Temp 98.4°F | Wt 75.4 lb

## 2015-10-25 DIAGNOSIS — R1310 Dysphagia, unspecified: Secondary | ICD-10-CM

## 2015-10-25 DIAGNOSIS — R0989 Other specified symptoms and signs involving the circulatory and respiratory systems: Secondary | ICD-10-CM | POA: Diagnosis present

## 2015-10-25 DIAGNOSIS — R079 Chest pain, unspecified: Secondary | ICD-10-CM | POA: Diagnosis not present

## 2015-10-25 DIAGNOSIS — Z7951 Long term (current) use of inhaled steroids: Secondary | ICD-10-CM | POA: Diagnosis not present

## 2015-10-25 DIAGNOSIS — Z79899 Other long term (current) drug therapy: Secondary | ICD-10-CM | POA: Insufficient documentation

## 2015-10-25 DIAGNOSIS — Z8669 Personal history of other diseases of the nervous system and sense organs: Secondary | ICD-10-CM | POA: Diagnosis not present

## 2015-10-25 DIAGNOSIS — Z8659 Personal history of other mental and behavioral disorders: Secondary | ICD-10-CM | POA: Diagnosis not present

## 2015-10-25 NOTE — Discharge Instructions (Signed)
Barium Swallow  A barium swallow is an X-ray exam that is used to evaluate the area at the back of the throat (pharynx) and the tube that carries food and liquid from the mouth to the stomach (esophagus). For this exam, you will swallow a white chalky liquid called barium. X-rays are done while the barium passes through the areas that are being checked. The barium shows up well on X-rays, making it easier for your health care provider to see possible problems.  A barium swallow may be done to check for various problems, such as:  · Ulcers.  · Tumors.  · Inflammation of the esophagus.  · Hiatal hernia. This is a condition in which the upper part of the stomach slides into the lower chest.  · Scarring.  · Blockages.  · Problems with the muscular wall of the pharynx and esophagus.  Your health care provider may recommend this procedure to help make a diagnosis if you have any of these symptoms:  · Difficulty swallowing.  · Chest pain that is not related to the heart.  · Gastroesophageal reflux, which is a backward flow of stomach contents into the esophagus.  · Unexplained vomiting.  · Severe indigestion.  LET YOUR HEALTH CARE PROVIDER KNOW ABOUT:  · Any allergies you have, especially allergies to contrast materials.  · All medicines you are taking, including vitamins, herbs, eye drops, creams, and over-the-counter medicines.  · Any blood disorders you have.  · Previous surgeries you have had.  · Any medical conditions you may have.  · If you are pregnant or you think that you may be pregnant.  RISKS AND COMPLICATIONS  Generally, this is a safe procedure. However, problems may occur, including:  · Constipation.  · Fecal impaction.  · Exposure to radiation (a small amount).  · Allergic reaction to the barium. This is rare.  BEFORE THE PROCEDURE  · Follow your health care provider's instructions about eating or drinking restrictions.  · Ask your health care provider about changing or stopping your regular medicines. This  is especially important if you are taking diabetes medicines or blood thinners.  PROCEDURE  · You will be positioned on an X-ray table.  · You will be asked to drink the liquid barium, which looks like a light-colored milkshake. You will probably drink the barium through a straw.  · During the procedure, the X-ray table may be moved to a more upright angle. You may also be asked to shift your position on the table. This will allow your entire esophagus to be viewed.  · The health care provider will watch the barium flow through your esophagus using a type of X-ray that allows images to be viewed on a monitor in a movie-like sequence (fluoroscopy). X-ray images will also be stored for later viewing.  The procedure may vary among health care providers and hospitals.  AFTER THE PROCEDURE  · Return to your normal activities and your normal diet as directed by your health care provider.  · Your stool (feces) may be white or gray for 2-3 days until all of the barium has passed out of your body in your stool. You may be given a laxative to take to help remove the barium from your body.  · Your health care provider may recommend other things to help prevent constipation after this procedure, including:    Drinking enough fluid to keep your urine clear or pale yellow.    Eating foods that are high in fiber,   such as fruits, vegetables, whole grains, and beans.  · Call your health care provider if:    You have difficulty having bowel movements, or you are not able to have a bowel movement or to pass gas.    You have belly (abdominal) pain or swelling of your abdomen.    You have a fever.  · It is your responsibility to obtain your test results. Ask your health care provider or the department performing the test when and how you will get your results.     This information is not intended to replace advice given to you by your health care provider. Make sure you discuss any questions you have with your health care provider.        Document Released: 10/24/2006 Document Revised: 07/03/2014 Document Reviewed: 03/24/2014  Elsevier Interactive Patient Education ©2016 Elsevier Inc.

## 2015-10-25 NOTE — ED Provider Notes (Signed)
CSN: [id number]     Arrival date & time 10/25/15  1418 History   First MD Initiated Contact with Patient 10/25/15 1814     Chief Complaint  Patient presents with  . Choking     (Consider location/radiation/quality/duration/timing/severity/associated sxs/prior Treatment) Patient is a 25 y.o. female presenting with GI illness. The history is provided by a parent.  GI Problem This is a recurrent problem. The current episode started 2 days ago. The problem occurs constantly. The problem has not changed since onset.Associated symptoms include chest pain (with swallowing). Pertinent negatives include no shortness of breath. Nothing aggravates the symptoms. Nothing relieves the symptoms. Treatments tried: soft diet. The treatment provided no relief.    Past Medical History  Diagnosis Date  . Cerebral palsy (HCC)   . Seizure disorder (HCC)   . Seizures (HCC)   . Mild intellectual disabilities    Past Surgical History  Procedure Laterality Date  . Cardiac surgery      Repaired valve in 2007-[place]  . 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 Hip arthroplasty      for recurrent dislocation; rebuilt acetabulum  . Inguinal hernia repair    . Other surgical history      [facility] as an infant   Family History  Problem Relation Age of Onset  . Asthma Brother   . Breast cancer Maternal Aunt     Dx in 2010, In remission  . Kidney failure Maternal Grandmother     Died at [phone]  . Leukemia Maternal Grandfather     Died at [id number]   Social History  Substance Use Topics  . Smoking status: Passive Smoke Exposure - Never Smoker  . Smokeless tobacco: Never Used     Comment: Mother and brother smoke   . Alcohol Use: No   OB History    No data available     Review of Systems  Respiratory: Negative for shortness of breath.   Cardiovascular: Positive for chest pain (with swallowing).  All  other systems reviewed and are negative.     Allergies  Demerol  Home Medications   Prior to Admission medications   Medication Sig Start Date End Date Taking? Authorizing Provider  CARBATROL 200 MG 12 hr capsule TAKE ONE CAPSULE BY MOUTH EVERY MORNING AND TAKE ONE CAPSULE BY MOUTH EVERY EVENING Patient taking differently: Take 200 mg by mouth 2 (two) times daily.  10/20/15   Deetta PerlaWilliam H Hickling, MD  cetirizine HCl (ZYRTEC) 5 MG/5ML SYRP Take 10 mLs (10 mg total) by mouth daily. Patient not taking: Reported on 10/23/2015 06/27/14   Charm RingsErin J Honig, MD  fluticasone [facility](FLONASE) 50 MCG/ACT nasal spray Place 2 sprays into both nostrils daily. 03/23/15   April Palumbo, MD  gabapentin (NEURONTIN) 100 MG capsule TAKE ONE CAPSULE BY MOUTH EVERY MORNING AND TAKE 2 CAPSULES BY MOUTH EVERY DAY AT BEDTIME Patient taking differently: Take 100-200 mg by mouth 2 (two) times daily. TAKE ONE CAPSULE BY MOUTH EVERY MORNING AND TAKE 2 CAPSULES BY MOUTH EVERY DAY AT BEDTIME 10/20/15   Deetta PerlaWilliam H Hickling, MD   BP 115/65 mmHg  Pulse 89  Temp(Src) 97.2 F (36.2 C) (Oral)  Resp 18  SpO2 100%  LMP 10/08/2015 (Exact Date) Physical Exam  Constitutional: She appears well-developed and well-nourished. No distress.  HENT:  Head: Normocephalic.  Eyes: Conjunctivae  are normal.  Neck: Neck supple. No tracheal deviation present.  Cardiovascular: Normal rate, regular rhythm and normal heart sounds.   Pulmonary/Chest: Effort normal. No respiratory distress. She exhibits no tenderness.  Abdominal: Soft. She exhibits no distension. There is no tenderness. There is no rigidity, no rebound and no guarding.  Neurological: She is alert.  Skin: Skin is warm and dry.  Psychiatric: She has a normal mood and affect.    ED Course  Procedures (including critical care time) Labs Review Labs Reviewed - No data to display  Imaging Review No results found. I have personally reviewed and evaluated these images and lab results as  part of my medical decision-making.   EKG Interpretation None      MDM   Final diagnoses:  Dysphagia    25 y.o. female presents with her mother for recurrent pain with swallowing after a choking episode that occurred recently. She has been adherent to a soft diet but has not had relief. She was able to tolerate water here and mother was under the impression that she was not to drink water. She has barium swallow scheduled for tomorrow. She is in no acute distress and handling secretions appropriately. No clinical appearance of esophageal impaction and no emergent GI indication currently. Recommended increasing fluids and continuing to pursue outpatient workup for dysphagia. Plan to follow up with PCP as needed and return precautions discussed for worsening or new concerning symptoms.     Lyndal Pulley, MD 10/26/15 [phone]

## 2015-10-25 NOTE — ED Notes (Signed)
Pt mother reports she has had trouble with swallowing and choking recently. She has a swallow study ordered for next week and told to eat thick liquid diet until then but she continues to choke on her food and now c/o painful swallowing so mother brought to ER for evaluation. Pt is alert and breathing easily

## 2015-10-25 NOTE — ED Notes (Signed)
Patient swallowed water without choking.  Patient did appear to exhibit some difficulty when first getting water into her mouth but did show any signs of choking.

## 2015-10-25 NOTE — Patient Instructions (Signed)
Dysphagia Diet Level 1, Pureed °The dysphasia level 1 diet includes foods that are completely pureed and smooth. The foods have a pudding-like texture, such as the texture of pureed pancakes, mashed potatoes, and yogurt. The diet does not include foods with lumps or coarse textures. Liquids should be smooth and may either be thin, nectar-thick, honey-like, or spoon-thick. °This diet is helpful for people with moderate to severe swallowing problems. It reduces the risk of food getting caught in the windpipe, trachea, or lungs. You may need help or supervision during meals while following this diet. °WHAT DO I NEED TO KNOW ABOUT THIS DIET? °Foods °· You may eat foods that are soft and have a pudding-like texture. If a food does not have this texture, you may be able to eat the food after: °¨ Pureeing it. This can be done with a blender or whisk. °¨ Moistening it with liquid. For example, you may have bread if you soak it in milk or syrup. °· Avoid foods that are hard, dry, sticky, chunky, lumpy, or stringy. Also avoid foods with nuts, seeds, raisins, skins, and pulp. °· Do not eat foods that you have to chew. If you have to chew the food, then you cannot eat it. °· Eat a variety of foods to get all the nutrients you need. °Liquids °· You may drink liquids that are smooth. Your health care provider will tell you if you should drink thin or thickened liquids. °· To thicken a liquid, use a food and beverage thickener or a thickening food. Thickened liquids are usually a "pudding-like" consistency. °· Thin liquids include fruit juices, milk, coffee, tea, yogurts, shakes, and similar foods that melt to thin liquid at room temperature. °· Avoid liquids with seeds, pulp, or chunks. °See your dietitian or health care provider regularly for help with your dietary changes. °WHAT FOODS CAN I EAT? °Grains °Store-bought soft breads, pancakes, and French toast that have a smooth, moist texture and do not have nuts or seeds (you  will need to moisten the food with liquid). Cooked cereals that have a pudding-like consistency, such as cream of wheat or farina (no oatmeal). Pureed, well-cooked pasta, rice, and plain bread stuffing. °Vegetables °Pureed vegetables. Soft avocado. Smooth tomato paste or sauce. Strained or pureed soups (these may need to be thickened as directed). Mashed or pureed potatoes without skin (can be seasoned with butter, smooth gravy, margarine, or sour cream). °Fruits °Pureed fruits such as melons and apples without seeds or pulp. Mashed bananas. Smooth tomato paste or sauce. Fruit juices without pulp or seeds. Strained or pureed soups. °Meat and Other Protein Sources °Pureed meat. Smooth pate or liverwurst. Smooth souffles. Pureed beans (such as lentils). Pureed eggs. °Dairy °Yogurt. Smooth cheese sauces. Milk (may need to be thickened). Nutritional dairy drinks or shakes. Ask your health care provider whether you can have ice cream. °Condiments °Finely ground salt, pepper, and other ground spices. °Sweets/Desserts °Smooth puddings and custards. Pureed desserts. Souffles. Whipped topping. Ask your health care provider whether you can have frozen desserts. °Fats and Oils °Butter. Margarine. Smooth and strained gravy. Sour cream. Mayonnaise. Cream cheese. Whipped topping. Smooth sauces (such as white sauce, cheese sauce, or hollandaise sauce). °The items listed above may not be a complete list of recommended foods or beverages. Contact your dietitian for more options. °WHAT FOODS ARE NOT RECOMMENDED? °Grains °Oatmeal. Dry cereals. Hard breads. °Vegetables °Whole vegetables. Stringy vegetables (such as celery). Thin tomato sauce. °Fruits °Whole fresh, frozen, canned, or dried fruits that have   not been pureed. Stringy fruits (such as pineapple). °Meat and Other Protein Sources °Whole or ground meat, fish, or poultry. Dried or cooked lentils or legumes that have been cooked but not mashed or pureed. Non-pureed eggs. Nuts and  seeds. Peanut butter. °Dairy °Non-pureed cheese. Dairy products with lumps or chunks. Ask your health care provider whether you can have ice cream. °Condiments °Coarse or seeded herbs and spices. °Sweets/Desserts °Chunky preserves. Jams with seeds. Solid desserts. Sticky, chewy sweets (such as licorice and caramel). Ask your health care provider whether you can have frozen desserts. °Fats and Oils °Sauces of fats with lumps or chunks. °The items listed above may not be a complete list of foods and beverages to avoid. Contact your dietitian for more information. °  °This information is not intended to replace advice given to you by your health care provider. Make sure you discuss any questions you have with your health care provider. °  °Document Released: 06/12/2005 Document Revised: 07/03/2014 Document Reviewed: 05/26/2013 °Elsevier Interactive Patient Education ©2016 Elsevier Inc. ° °

## 2015-10-25 NOTE — Progress Notes (Signed)
   Subjective:   Joesph FillersJacqueline A Galano is a 25 y.o. female with a history of CP, seizure disorder, scoliosis here for same day appt for ED f/u for dysphagia.  Difficulty swallowing - Patient seen in ED 4/29 after choking on chicken nugget - Mother gagged her and she spit it up - Able to keep fluids down in ED and well appearing - Denies any SOB, fevers - Patient regularly coughs and gags while eating for 1.5 months - Mom thought she wasn't chewing it well, but cutting it up well isnt helping - Says it hurts after she swallows, with liquids - Never had swallowing eval, per mom's recollection - Getting worse - mom is really worried  Review of Systems:  Per HPI.   Social History: never smoker  Objective:  BP 131/60 mmHg  Pulse 86  Temp(Src) 98.4 F (36.9 C) (Oral)  Wt 75 lb 6.4 oz (34.201 kg)  LMP 10/08/2015 (Exact Date)  Gen:  25 y.o. female in NAD, drinks water with delayed gulping and reports pain in upper chest afterwards, no coughing or gagging HEENT: NCAT, MMM, EOMI, PERRL, anicteric sclerae, OP clear CV: RRR, no MRG Resp: Non-labored, CTAB, no wheezes noted Abd: Soft, NTND, BS present, no guarding or organomegaly Ext: WWP, no edema Neuro: Alert, interactive     Assessment & Plan:     Joesph FillersJacqueline A Vanvleck is a 25 y.o. female here for dysphagia  Dysphagia Possibly related to aspiration - patient is at risk due to CP No evidence of aspiration PNA at this time Could also be related to esophageal stricture Reassured mom that patient is safe to be treated as OP at this time Modified barium swallow, SLP and GI referrals Pureed diet currently F/u in 1 week to monitor status     Erasmo DownerAngela M Chalmers Iddings, MD MPH PGY-2,  [facility] 10/25/2015  12:03 PM

## 2015-10-25 NOTE — Assessment & Plan Note (Addendum)
Possibly related to aspiration - patient is at risk due to CP No evidence of aspiration PNA at this time Could also be related to esophageal stricture Reassured mom that patient is safe to be treated as OP at this time Modified barium swallow, SLP and GI referrals Pureed diet currently precepted with Dr Mauricio PoBreen F/u in 1 week to monitor status

## 2015-10-26 ENCOUNTER — Ambulatory Visit (HOSPITAL_COMMUNITY)
Admit: 2015-10-26 | Discharge: 2015-10-26 | Disposition: A | Discharge status: Home / Self Care | Payer: Medicaid Other | Source: Ambulatory Visit | Attending: Family Medicine | Admitting: Family Medicine

## 2015-10-26 ENCOUNTER — Ambulatory Visit (HOSPITAL_COMMUNITY)
Admit: 2015-10-26 | Discharge: 2015-10-26 | Disposition: A | Discharge status: Home / Self Care | Payer: Medicaid Other | Attending: *Deleted | Admitting: *Deleted

## 2015-10-26 DIAGNOSIS — R1310 Dysphagia, unspecified: Secondary | ICD-10-CM | POA: Diagnosis present

## 2015-10-26 NOTE — Progress Notes (Signed)
  Speech Pathology   MBSS complete. Full report located under chart review in imaging section. Double click on DG swallow function.    Abril Cappiello Willis Garl Speigner M.Ed CCC-SLP Pager [phone]    

## 2015-10-29 ENCOUNTER — Telehealth: Payer: Self-pay | Admitting: *Deleted

## 2015-10-29 NOTE — Telephone Encounter (Signed)
[facility] called and said that pt has been seen by a GI MD previously which is not a [facility].  [facility]  I was unable to catch the name of who was call [facility], but the number is [phone]. Fleeger, Maryjo RochesterJessica Dawn, CMA

## 2015-11-01 ENCOUNTER — Ambulatory Visit (INDEPENDENT_AMBULATORY_CARE_PROVIDER_SITE_OTHER): Payer: Medicaid Other | Admitting: Family Medicine

## 2015-11-01 ENCOUNTER — Encounter: Payer: Self-pay | Admitting: Family Medicine

## 2015-11-01 VITALS — BP 126/60 | Temp 98.7°F | Wt 76.0 lb

## 2015-11-01 DIAGNOSIS — G809 Cerebral palsy, unspecified: Secondary | ICD-10-CM | POA: Diagnosis not present

## 2015-11-01 DIAGNOSIS — R1310 Dysphagia, unspecified: Secondary | ICD-10-CM

## 2015-11-01 DIAGNOSIS — G808 Other cerebral palsy: Secondary | ICD-10-CM

## 2015-11-01 MED ORDER — CETIRIZINE HCL 5 MG/5ML PO SYRP: 10.0000 mg | ORAL_SOLUTION | Freq: Every day | ORAL | Status: DC

## 2015-11-01 NOTE — Progress Notes (Signed)
   Subjective:   Kristin Fitzgerald is a 25 y.o. female with a history of CP, seizure disorder, scoliosis here for dysphagia f/u.  Difficulty swallowing - Patient seen in ED 4/29 after choking on chicken nugget, f/u in clinic 5/1 and back in ED 5/1 - Able to keep fluids down in ED and well appearing - Denies any SOB, fevers - Patient regularly coughs and gags while eating for 1.5 months - Never had swallowing eval previously, per mom's recollection - MBSS 5/2 with Mild pharyngeal phase dysphagia;Mild oral phase dysphagia - SLP rec regular diet - appetite has improved - eating meats chopped small and drinking liquids - no longer complaining of it hurting  CP, seizure disorder - handicap placard stolen and needs replacement  Review of Systems:  Per HPI.   Social History: never smoker  Objective:  BP 126/60 mmHg  Temp(Src) 98.7 F (37.1 C) (Oral)  Wt 76 lb (34.473 kg)  LMP 10/08/2015 (Exact Date)  Gen: 25 y.o. female in NAD HEENT: NCAT, MMM, EOMI, PERRL, anicteric sclerae, OP clear CV: RRR, no MRG Resp: Non-labored, CTAB, no wheezes noted Abd: Soft, NTND, BS present, no guarding or organomegaly Ext: WWP, no edema MSK: Severe scoliosis, wheelchair bound Neuro: Alert, interactive     Assessment & Plan:     Kristin FillersJacqueline A Fitzgerald is a 25 y.o. female here for dysphagia f/u  Dysphagia No evidence of aspiration PNA MBSS reassuring Continue regular diet well chopped GI appt scheduled in 5 weeks for esophageal eval F/u after GI appt  Cerebral palsy Handicap placard form completed    Erasmo DownerAngela M Asjia Berrios, MD MPH PGY-2,  [facility] 11/01/2015  10:16 AM

## 2015-11-01 NOTE — Assessment & Plan Note (Signed)
No evidence of aspiration PNA MBSS reassuring Continue regular diet well chopped GI appt scheduled in 5 weeks for esophageal eval F/u after GI appt

## 2015-11-01 NOTE — Telephone Encounter (Signed)
Mother reports that she has not seen adult GI previously.  [facility], already has appt scheduled.  Please let [facility] know. Thanks!  Erasmo DownerAngela M Shady Bradish, MD, MPH PGY-2,  [facility] 11/01/2015 10:03 AM

## 2015-11-01 NOTE — Telephone Encounter (Signed)
[facility] was speaking of pt's pediactric GI, Dr. Candis MusaSchall. Pt is scheduled for 6/15 @ 3:30 PM with Dr. Dulce Sellarutlaw.

## 2015-11-01 NOTE — Assessment & Plan Note (Signed)
Handicap placard form completed

## 2015-11-01 NOTE — Patient Instructions (Signed)

## 2015-11-14 ENCOUNTER — Other Ambulatory Visit: Payer: Self-pay | Admitting: Family

## 2015-11-15 MED ORDER — CARBATROL 200 MG PO CP12: ORAL_CAPSULE | ORAL | Status: DC

## 2015-11-15 NOTE — Telephone Encounter (Signed)
Previous Rx did not print. TG 

## 2015-11-15 NOTE — Addendum Note (Signed)
Addended by: Princella IonGOODPASTURE, Camila Norville P on: 11/15/2015 10:19 AM   Modules accepted: Orders

## 2016-01-28 ENCOUNTER — Telehealth: Payer: Self-pay | Admitting: Family Medicine

## 2016-01-28 NOTE — Telephone Encounter (Signed)
Pt's mom stated Cybil from [facility] sent in a fax over 2 weeks ago to request diapers and bed pads. Another request was sent this week. Pt really needs these items soon. Please advise. Thanks! ep

## 2016-01-31 NOTE — Telephone Encounter (Signed)
The papers were signed by an attending and put in the fax pile last Tuesday. Is there a way to check and make sure they were faxed?  Thanks.

## 2016-01-31 NOTE — Telephone Encounter (Signed)
Pt's mom called again, [facility] has not received the fax. I went through all the faxes from last week and it is not there. Please advise, mom is extremely upset. Thanks! ep

## 2016-02-01 NOTE — Telephone Encounter (Signed)
Patient mother states she is going to have [facility], would like these to be completed today.

## 2016-02-01 NOTE — Telephone Encounter (Signed)
Form signed by Dr Deirdre Priesthambliss and Shanda BumpsJessica faxed back to [facility]  Erasmo DownerAngela M Jillianna Stanek, MD, MPH PGY-3,  [facility] 02/01/2016 3:12 PM

## 2016-02-01 NOTE — Telephone Encounter (Signed)
Called mom, had to LM that we were faxing the form back now. Kristin Fitzgerald, Maryjo RochesterJessica Dawn, CMA

## 2016-03-18 ENCOUNTER — Emergency Department (HOSPITAL_COMMUNITY)
Admission: EM | Admit: 2016-03-18 | Discharge: 2016-03-18 | Disposition: A | Discharge status: Home / Self Care | Payer: Medicaid Other | Attending: Emergency Medicine | Admitting: Emergency Medicine

## 2016-03-18 ENCOUNTER — Emergency Department (HOSPITAL_COMMUNITY): Payer: Medicaid Other

## 2016-03-18 ENCOUNTER — Encounter (HOSPITAL_COMMUNITY): Payer: Self-pay | Admitting: Emergency Medicine

## 2016-03-18 DIAGNOSIS — Z7722 Contact with and (suspected) exposure to environmental tobacco smoke (acute) (chronic): Secondary | ICD-10-CM | POA: Insufficient documentation

## 2016-03-18 DIAGNOSIS — G809 Cerebral palsy, unspecified: Secondary | ICD-10-CM | POA: Diagnosis not present

## 2016-03-18 DIAGNOSIS — R0989 Other specified symptoms and signs involving the circulatory and respiratory systems: Secondary | ICD-10-CM | POA: Diagnosis present

## 2016-03-18 DIAGNOSIS — T17320A Food in larynx causing asphyxiation, initial encounter: Secondary | ICD-10-CM

## 2016-03-18 DIAGNOSIS — Z96649 Presence of unspecified artificial hip joint: Secondary | ICD-10-CM | POA: Insufficient documentation

## 2016-03-18 MED ORDER — ONDANSETRON HCL 4 MG/2ML IJ SOLN
4.0000 mg | Freq: Once | INTRAMUSCULAR | Status: DC
  Filled 2016-03-18: qty 2

## 2016-03-18 MED ORDER — ONDANSETRON HCL 4 MG/2ML IJ SOLN: 4.0000 mg | Freq: Once | INTRAMUSCULAR | Status: DC

## 2016-03-18 MED ORDER — ACETAMINOPHEN 160 MG/5ML PO SOLN
650.0000 mg | Freq: Once | ORAL | Status: AC
  Administered 2016-03-18: 650 mg via ORAL
  Filled 2016-03-18: qty 20.3

## 2016-03-18 MED ORDER — FENTANYL CITRATE (PF) 100 MCG/2ML IJ SOLN
50.0000 ug | Freq: Once | INTRAMUSCULAR | Status: DC
  Filled 2016-03-18: qty 2

## 2016-03-18 MED ORDER — LIDOCAINE VISCOUS 2 % MT SOLN: 10.0000 mL | OROMUCOSAL | 0 refills | Status: DC | PRN

## 2016-03-18 NOTE — ED Notes (Signed)
Patient returned from X-ray 

## 2016-03-18 NOTE — ED Triage Notes (Signed)
From home via EMS: hx of CP, eats pureed food. Got choked while eating pureed manwich and for a few minutes airway seemed obstructed per family; mother hit on her back. Then vomited copiously. Mother reports no LOC. On EMS arrival, patient talking in full sentences. On arrival, reports she feels ok.

## 2016-03-18 NOTE — ED Provider Notes (Signed)
[facility]-EM[facility] DEPT Provider Note   CSN: [id number] Arrival date & time: 03/18/16  1228     History   Chief Complaint Chief Complaint  Patient presents with  . Choking    HPI Kristin Fitzgerald is a 25 y.o. female who presents to the hospital for episode of choking and regurgitation. She has a past medical history of cerebral palsy and eats liquid foods. Today, she was swallowing and got choked and coughed and coughed and then vomited. She states that her negative nieces and nephews got really scared and cried and called 911. She has some pain when swallowing now, but denies any difficulty breathing or stridor. She has not been running fevers. She has no chest pain, abdominal pain.  HPI  Past Medical History:  Diagnosis Date  . Cerebral palsy (HCC)   . Mild intellectual disabilities   . Seizure disorder (HCC)   . Seizures [facility])     Patient Active Problem List   Diagnosis Date Noted  . Dysphagia 10/25/2015  . 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  . 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 Scoliosis associated with other condition 05/19/2013  . Unspecified disorder of eye movements 05/19/2013  . 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 Unspecified urinary incontinence 05/19/2013  . Unspecified congenital cataract 05/19/2013  . Cerebral palsy (HCC) 07/06/2011  . Seizure disorder (HCC) 07/06/2011  . Heart murmur 07/06/2011  . Healthcare maintenance 07/06/2011    Past Surgical History:  Procedure Laterality Date  . CARDIAC SURGERY     Repaired valve in 2007-[place]  . EYE SURGERY       bilateral eye surgery in infancy  . HIP ARTHROPLASTY     for recurrent dislocation; rebuilt acetabulum  . INGUINAL HERNIA REPAIR    . 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 OTHER SURGICAL  HISTORY     [facility] [place] [facility] as an infant    OB History    No data available       Home Medications    Prior to Admission medications   Medication Sig Start Date End Date Taking? Authorizing Provider  CARBATROL 200 MG 12 hr capsule TAKE ONE CAPSULE BY MOUTH EVERY MORNING AND TAKE ONE CAPSULE BY MOUTH EVERY EVENING Patient taking differently: Take 200 mg by mouth 2 (two) times daily.  11/15/15  Yes Elveria Rising, NP  cetirizine HCl (ZYRTEC) 5 MG/5ML SYRP Take 10 mLs (10 mg total) by mouth daily. Patient taking differently: Take 10 mg by mouth daily as needed for allergies.  11/01/15  Yes Erasmo Downer, MD  fluticasone (FLONASE) 50 MCG/ACT nasal spray Place 2 sprays into both nostrils daily. Patient taking differently: Place 2 sprays into both nostrils daily as needed for allergies or rhinitis.  03/23/15  Yes April Palumbo, MD  gabapentin (NEURONTIN) 100 MG capsule TAKE ONE CAPSULE BY MOUTH EVERY MORNING AND TAKE 2 CAPSULES BY MOUTH EVERY DAY AT BEDTIME Patient taking differently: Take 100-200 mg by mouth See admin instructions. Take 1 capsule (100 mg) every morning and 2 capsules (200 mg) at bedtime 10/20/15  Yes Deetta Perla, MD  lidocaine (XYLOCAINE) 2 % solution Use as directed 10 mLs in the mouth or throat every 4 (four) hours as needed (throat pain). 03/18/16   Arthor Captain, PA-C    Family History Family History  Problem Relation Age of Onset  .  Asthma Brother   . Kidney failure Maternal Grandmother     Died at 4974  . Leukemia Maternal Grandfather     Died at 5168  . Breast cancer Maternal Aunt     Dx in 2010, In remission    Social History Social History  Substance Use Topics  . Smoking status: Passive Smoke Exposure - Never Smoker  . Smokeless tobacco: Never Used     Comment: Mother and brother smoke   . Alcohol use No     Allergies   Demerol   Review of Systems Review of Systems Ten systems reviewed and are negative for acute change, except as  noted in the HPI.    Physical Exam Updated Vital Signs BP 109/60 (BP Location: Left Arm)   Pulse 92   Temp 97.6 F (36.4 C) (Oral)   Resp 16   LMP 03/10/2016 (Exact Date)   SpO2 100%   Physical Exam  Constitutional: She is oriented to person, place, and time. She appears well-nourished. No distress.  Short stature, hemiplegia and physical appearance is consistent with diagnosis of cerebral palsy  HENT:  Head: Normocephalic and atraumatic.  Nose: Nose normal.  Mouth/Throat: Oropharynx is clear and moist.  Eyes: Conjunctivae and EOM are normal. Pupils are equal, round, and reactive to light. No scleral icterus.  Neck: Normal range of motion. No tracheal deviation present.  Cardiovascular: Normal rate, regular rhythm and normal heart sounds.  Exam reveals no gallop and no friction rub.   No murmur heard. Pulmonary/Chest: Effort normal and breath sounds normal. No stridor. No respiratory distress.  Abdominal: Soft. Bowel sounds are normal. She exhibits no distension and no mass. There is no tenderness. There is no guarding.  Neurological: She is alert and oriented to person, place, and time.  Skin: Skin is warm and dry. She is not diaphoretic.  Nursing note and vitals reviewed.    ED Treatments / Results  Labs (all labs ordered are listed, but only abnormal results are displayed) Labs Reviewed - No data to display  EKG  EKG Interpretation None       Radiology Dg Neck Soft Tissue  Result Date: 03/18/2016 CLINICAL DATA:  Recent episode of choking.  Down syndrome EXAM: NECK SOFT TISSUES - 1+ VIEW COMPARISON:  None. FINDINGS: Frontal and lateral views were obtained. Epiglottis and aryepiglottic folds appear normal. Tracheal air column a cervical region appears unremarkable. There is no prevertebral soft tissue swelling. No air-fluid level. No bony lesions evident. No radiopaque foreign body evident. IMPRESSION: No lesion evident. Tracheal air column appears normal. Epiglottis  and aryepiglottic folds appear normal. No radiopaque foreign body evident. Electronically Signed   By: Bretta BangWilliam  Woodruff III M.D.   On: 03/18/2016 14:44   Dg Chest 2 View  Result Date: 03/18/2016 CLINICAL DATA:  Chest pain with recent choking while eating. Down syndrome. EXAM: CHEST  2 VIEW COMPARISON:  October 20, 2015 FINDINGS: The lungs are clear. The heart size and pulmonary vascularity are normal. Patient is status post median sternotomy. There is thoracolumbar levoscoliosis. No adenopathy evident. IMPRESSION: No edema or consolidation.  Stable cardiac silhouette. Electronically Signed   By: Bretta BangWilliam  Woodruff III M.D.   On: 03/18/2016 14:42    Procedures Procedures (including critical care time)  Medications Ordered in ED Medications  acetaminophen (TYLENOL) solution 650 mg (650 mg Oral Given 03/18/16 1425)     Initial Impression / Assessment and Plan / ED Course  I have reviewed the triage vital signs and the nursing  notes.  Pertinent labs & imaging results that were available during my care of the patient were reviewed by me and considered in my medical decision making (see chart for details).  Clinical Course   patient with chest x-ray and soft tissue x-ray of the neck which are normal. She has no stridor, pain airway, swallowing her own saliva. The patient is appropriate for discharge. Gait and prescription for viscous lidocaine. Discussed reasons to seek immediate medical care including worsening shortness of breath, fever, productive cough.  Final Clinical Impressions(s) / ED Diagnoses   Final diagnoses:  Choking due to food (regurgitated), initial encounter    New Prescriptions Discharge Medication List as of 03/18/2016  3:32 PM    START taking these medications   Details  lidocaine (XYLOCAINE) 2 % solution Use as directed 10 mLs in the mouth or throat every 4 (four) hours as needed (throat pain)., Starting Sat 03/18/2016, Print         Arthor Captain, PA-C 03/18/16  2113    Arthor Captain, PA-C 03/18/16 2120    Lyndal Pulley, MD 03/19/16 [phone]

## 2016-03-18 NOTE — Discharge Instructions (Signed)
Your imaging was negative. You may swallow this viscous lidocaine every 4-6 hours for throat pain. continue with liquid foods. Follow-up at the emergency department if he develops difficulty breathing, fever, productive cough, shortness of breath or wheezing.

## 2016-03-18 NOTE — ED Notes (Signed)
Patient transported to X-ray 

## 2016-05-07 ENCOUNTER — Telehealth: Payer: Self-pay | Admitting: Obstetrics and Gynecology

## 2016-05-07 NOTE — Telephone Encounter (Signed)
Family Medicine Emergency Line Telephone Note   Mother called wanting to know what to give patient for congestion. She has been congested really bad recently. No fevers. Patient is well-appearing. She wanted a doctor's recommendation because of patient's history of CP and heart valve replacement. Nothing has been tried. Discussed with mother that patient can take some OTC decongestants. This would not affect her heart valve. If patient not improved with OTC medications recommended mom bring her into clinic this week for further evaluation.   Mother voiced understanding and agreed to plan.   Caryl AdaJazma Phelps, DO 05/07/2016, 12:21 PM PGY-3, [facility]

## 2016-05-15 ENCOUNTER — Other Ambulatory Visit: Payer: Self-pay | Admitting: Family

## 2016-05-15 ENCOUNTER — Other Ambulatory Visit (INDEPENDENT_AMBULATORY_CARE_PROVIDER_SITE_OTHER): Payer: Self-pay | Admitting: Family

## 2016-05-15 DIAGNOSIS — G40309 Generalized idiopathic epilepsy and epileptic syndromes, not intractable, without status epilepticus: Secondary | ICD-10-CM

## 2016-05-15 MED ORDER — CARBATROL 200 MG PO CP12: ORAL_CAPSULE | ORAL | 0 refills | Status: DC

## 2016-05-15 NOTE — Telephone Encounter (Signed)
Previous Rx did not print. TG 

## 2016-05-16 ENCOUNTER — Telehealth (INDEPENDENT_AMBULATORY_CARE_PROVIDER_SITE_OTHER): Payer: Self-pay | Admitting: Pediatrics

## 2016-05-16 NOTE — Telephone Encounter (Signed)
-----   Message from Elveria Risingina Goodpasture, NP sent at 05/15/2016 11:31 AM EST ----- Regarding: Needs appointment Kristin Fitzgerald needs an appointment with Dr Sharene SkeansHickling or his resident.  Thanks, Inetta Fermoina

## 2016-05-16 NOTE — Telephone Encounter (Signed)
Schedule appt for Jul 17, 2016 at 8:00am

## 2016-05-20 ENCOUNTER — Other Ambulatory Visit: Payer: Self-pay | Admitting: Pediatrics

## 2016-05-20 DIAGNOSIS — G40309 Generalized idiopathic epilepsy and epileptic syndromes, not intractable, without status epilepticus: Secondary | ICD-10-CM

## 2016-05-20 DIAGNOSIS — G40209 Localization-related (focal) (partial) symptomatic epilepsy and epileptic syndromes with complex partial seizures, not intractable, without status epilepticus: Secondary | ICD-10-CM

## 2016-06-10 ENCOUNTER — Emergency Department (HOSPITAL_COMMUNITY)
Admission: EM | Admit: 2016-06-10 | Discharge: 2016-06-10 | Disposition: A | Discharge status: Home / Self Care | Payer: Medicaid Other | Attending: Emergency Medicine | Admitting: Emergency Medicine

## 2016-06-10 ENCOUNTER — Encounter (HOSPITAL_COMMUNITY): Payer: Self-pay | Admitting: Emergency Medicine

## 2016-06-10 ENCOUNTER — Emergency Department (HOSPITAL_COMMUNITY): Payer: Medicaid Other

## 2016-06-10 DIAGNOSIS — G809 Cerebral palsy, unspecified: Secondary | ICD-10-CM | POA: Insufficient documentation

## 2016-06-10 DIAGNOSIS — H6123 Impacted cerumen, bilateral: Secondary | ICD-10-CM | POA: Insufficient documentation

## 2016-06-10 DIAGNOSIS — R05 Cough: Secondary | ICD-10-CM | POA: Diagnosis not present

## 2016-06-10 DIAGNOSIS — H9203 Otalgia, bilateral: Secondary | ICD-10-CM | POA: Diagnosis present

## 2016-06-10 DIAGNOSIS — Z7722 Contact with and (suspected) exposure to environmental tobacco smoke (acute) (chronic): Secondary | ICD-10-CM | POA: Insufficient documentation

## 2016-06-10 DIAGNOSIS — Z96649 Presence of unspecified artificial hip joint: Secondary | ICD-10-CM | POA: Diagnosis not present

## 2016-06-10 DIAGNOSIS — R059 Cough, unspecified: Secondary | ICD-10-CM

## 2016-06-10 LAB — URINALYSIS, ROUTINE W REFLEX MICROSCOPIC
BILIRUBIN URINE: NEGATIVE
Glucose, UA: NEGATIVE mg/dL
Hgb urine dipstick: NEGATIVE
Ketones, ur: NEGATIVE mg/dL
Leukocytes, UA: NEGATIVE
NITRITE: NEGATIVE
PROTEIN: NEGATIVE mg/dL
SPECIFIC GRAVITY, URINE: 1.016 (ref 1.005–1.030)
pH: 7 (ref 5.0–8.0)

## 2016-06-10 NOTE — Discharge Instructions (Signed)
Continue using Flonase and cetirizine. Follow up with your primary care provider for discussion of today's diagnosis. Return to ER for fevers, new or worsening symptoms, any additional concerns.

## 2016-06-10 NOTE — ED Provider Notes (Signed)
[facility] Provider Note   CSN: [id number] Arrival date & time: 06/10/16  1247     History   Chief Complaint Chief Complaint  Patient presents with  . Cough  . Nasal Congestion  . Otalgia  . Chest Pain    HPI Kristin Fitzgerald is a 25 y.o. female.  The history is provided by the patient, medical records and a parent. No language interpreter was used.  Cough  Associated symptoms include chest pain and ear pain. Pertinent negatives include no chills and no shortness of breath.  Otalgia  Associated symptoms include cough. Pertinent negatives include no abdominal pain and no diarrhea.  Chest Pain   Associated symptoms include cough. Pertinent negatives include no abdominal pain, no back pain, no fever, no nausea, no shortness of breath and no weakness.   Kristin Fitzgerald is a 25 y.o. female  with a PMH of cerebral palsy, seizure disorder, scoliosis who presents to the Emergency Department with mother for two concerns:   1. Patient with persistent dry cough x 2-3 days. Patient endorses her ears and chest hurting today so she brought her in to ER for further evaluation. No fevers. No sick contacts. No difficulty breathing.   2. Per mother, patient is not urinating as much as usual. Appears to grimace as if she is trying to hold urine in. Mother concerned she may be experiencing dysuria.    Past Medical History:  Diagnosis Date  . Cerebral palsy (HCC)   . Mild intellectual disabilities   . Seizure disorder (HCC)   . Seizures [facility])     Patient Active Problem List   Diagnosis Date Noted  . Dysphagia 10/25/2015  . 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  . 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 Scoliosis associated with other condition 05/19/2013  . Unspecified disorder of eye movements 05/19/2013  . Mild  intellectual disability 05/19/2013  . Unspecified urinary incontinence 05/19/2013  . Unspecified congenital cataract 05/19/2013  . Cerebral palsy (HCC) 07/06/2011  . Seizure disorder (HCC) 07/06/2011  . Heart murmur 07/06/2011  . Healthcare maintenance 07/06/2011    Past Surgical History:  Procedure Laterality Date  . CARDIAC SURGERY     Repaired valve in 2007-[place]  . EYE SURGERY       bilateral eye surgery in infancy  . HIP ARTHROPLASTY     for recurrent dislocation; rebuilt acetabulum  . INGUINAL HERNIA REPAIR    . 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 OTHER SURGICAL HISTORY     [facility] as an infant    OB History    No data available       Home Medications    Prior to Admission medications   Medication Sig Start Date End Date Taking? Authorizing Provider  CARBATROL 200 MG 12 hr capsule TAKE ONE CAPSULE BY MOUTH EVERY MORNING AND TAKE TAKE ONE CAPSULE EVERY DAY AT BEDTIME Patient taking differently: Take 200 mg by mouth 2 (two) times daily.  05/15/16  Yes Elveria Rising, NP  fluticasone (FLONASE) 50 MCG/ACT nasal spray Place 2 sprays into both nostrils daily. Patient taking differently: Place 2 sprays into both nostrils daily as needed for allergies or rhinitis.  03/23/15  Yes April Palumbo, MD  gabapentin (NEURONTIN) 100 MG capsule TAKE ONE CAPSULE BY MOUTH EVERY MORNING AND TAKE 2 CAPSULES AT BEDTIME Patient  taking differently: TAKE 100 MG BY MOUTH EVERY MORNING AND TAKE 200 MG AT BEDTIME 05/22/16  Yes Elveria Risingina Goodpasture, NP  cetirizine HCl (ZYRTEC) 5 MG/5ML SYRP Take 10 mLs (10 mg total) by mouth daily. Patient taking differently: Take 10 mg by mouth daily as needed for allergies.  11/01/15   Erasmo DownerAngela M Bacigalupo, MD  lidocaine (XYLOCAINE) 2 % solution Use as directed 10 mLs in the mouth or throat every 4 (four) hours as needed (throat pain). Patient not taking: Reported on 06/10/2016 03/18/16   Arthor CaptainAbigail Harris, PA-C     Family History Family History  Problem Relation Age of Onset  . Asthma Brother   . Kidney failure Maternal Grandmother     Died at 3774  . Leukemia Maternal Grandfather     Died at 1968  . Breast cancer Maternal Aunt     Dx in 2010, In remission    Social History Social History  Substance Use Topics  . Smoking status: Passive Smoke Exposure - Never Smoker  . Smokeless tobacco: Never Used     Comment: Mother and brother smoke   . Alcohol use No     Allergies   Demerol   Review of Systems Review of Systems  Constitutional: Negative for activity change, appetite change, chills and fever.  HENT: Positive for congestion and ear pain.   Respiratory: Positive for cough. Negative for shortness of breath.   Cardiovascular: Positive for chest pain.  Gastrointestinal: Negative for abdominal pain, diarrhea and nausea.  Genitourinary: Positive for decreased urine volume.  Musculoskeletal: Negative for back pain.  Skin: Negative for color change.  Neurological: Negative for weakness.  Psychiatric/Behavioral: Negative for agitation.     Physical Exam Updated Vital Signs Wt 44 kg   LMP 06/03/2016   Physical Exam  Constitutional: She appears well-developed and well-nourished. No distress.  Pleasant, non-toxic appearing.   HENT:  Head: Normocephalic and atraumatic.  Mouth/Throat: Oropharynx is clear and moist.  Bilateral ears with cerumen impaction.  Neck: Normal range of motion. Neck supple.  Cardiovascular: Normal rate, regular rhythm and normal heart sounds.   No murmur heard. Pulmonary/Chest: Effort normal and breath sounds normal. No respiratory distress. She has no wheezes. She has no rales. She exhibits no tenderness.  Abdominal: Soft. She exhibits no distension. There is no tenderness.  Neurological: She is alert.  Skin: Skin is warm and dry.  Nursing note and vitals reviewed.    ED Treatments / Results  Labs (all labs ordered are listed, but only abnormal  results are displayed) Labs Reviewed  URINALYSIS, ROUTINE W REFLEX MICROSCOPIC - Abnormal; Notable for the following:       Result Value   APPearance CLOUDY (*)    All other components within normal limits    EKG  EKG Interpretation None       Radiology Dg Chest 2 View  Result Date: 06/10/2016 CLINICAL DATA:  Cough for 3 days EXAM: CHEST  2 VIEW COMPARISON:  03/18/2016 FINDINGS: Cardiomegaly is noted. Status post median sternotomy. Elevation of the right hemidiaphragm again noted. No infiltrate or pleural effusion. No pulmonary edema. IMPRESSION: No active cardiopulmonary disease. Borderline cardiomegaly. Status post median sternotomy. Elevation of the right hemidiaphragm again noted. Electronically Signed   By: Natasha MeadLiviu  Pop M.D.   On: 06/10/2016 15:05    Procedures .Ear Cerumen Removal Date/Time: 06/10/2016 4:09 PM Performed by: Janyth ContesWARD, JAIME PILCHER Authorized by: Janyth ContesWARD, JAIME PILCHER   Consent:    Consent obtained:  Verbal   Consent given by:  Patient and parent Procedure details:    Location:  L ear and R ear   Procedure type: curette   Post-procedure details:    Patient tolerance of procedure:  Tolerated well, no immediate complications   (including critical care time)  Medications Ordered in ED Medications - No data to display   Initial Impression / Assessment and Plan / ED Course  I have reviewed the triage vital signs and the nursing notes.  Pertinent labs & imaging results that were available during my care of the patient were reviewed by me and considered in my medical decision making (see chart for details).  Clinical Course    Kristin FillersJacqueline A Vandevender is a 25 y.o. female who presents to ED with mother for two complaints:  1. Decreased urine output. Patient has not endorsed any urinary symptoms, but mother states that she grimaced and then had an accident. Mother concerned she might be trying to hold urine because of burning. UA was obtained which was  negative. Increase hydration.  2. Cough, nasal congestion and ear ache. On exam, patient is afebrile, well-appearing with clear lungs bilaterally. Oropharynx clear and moist. Bilateral ears with cerumen impaction. Cerumen removed with curette by me as dictated above. TM visualized bilaterally with no signs of infection and intact. Still fair amount of wax in the ears. Offered irrigation for further relief, but mother at bedside states that curette wax removal was good enough and does not want to have irrigation performed. Chest x-ray with no consolidation. Continue Zyrtec and Flonase.   PCP follow-up. Return precautions discussed with mother. All questions answered.   Final Clinical Impressions(s) / ED Diagnoses   Final diagnoses:  Cough  Bilateral impacted cerumen    New Prescriptions New Prescriptions   No medications on file     [facility] CenterJaime Pilcher Ward, PA-C 06/10/16 1613    Cathren LaineKevin Steinl, MD 06/11/16 [phone]

## 2016-06-10 NOTE — ED Triage Notes (Signed)
Mother stated, shes had a cough and trying to get her to blow her nose and she will cry saying her ears hurt. This has been going on for 3 days.

## 2016-06-15 ENCOUNTER — Telehealth (INDEPENDENT_AMBULATORY_CARE_PROVIDER_SITE_OTHER): Payer: Self-pay | Admitting: Pediatrics

## 2016-06-15 ENCOUNTER — Other Ambulatory Visit (INDEPENDENT_AMBULATORY_CARE_PROVIDER_SITE_OTHER): Payer: Self-pay | Admitting: Pediatrics

## 2016-06-15 DIAGNOSIS — G40309 Generalized idiopathic epilepsy and epileptic syndromes, not intractable, without status epilepticus: Secondary | ICD-10-CM

## 2016-06-15 NOTE — Telephone Encounter (Signed)
Appointment scheduled for 07/17/16 at 8:15am

## 2016-06-15 NOTE — Telephone Encounter (Signed)
-----   Message from Tina Goodpasture, NP sent at 05/15/2016 11:31 AM EST ----- °Regarding: Needs appointment °Payge needs an appointment with Dr Hickling or his resident.  °Thanks, °Tina °

## 2016-07-07 ENCOUNTER — Encounter: Payer: Self-pay | Admitting: Family Medicine

## 2016-07-07 ENCOUNTER — Ambulatory Visit (INDEPENDENT_AMBULATORY_CARE_PROVIDER_SITE_OTHER): Payer: Medicaid Other | Admitting: Family Medicine

## 2016-07-07 VITALS — BP 126/82 | HR 80

## 2016-07-07 DIAGNOSIS — Z23 Encounter for immunization: Secondary | ICD-10-CM

## 2016-07-07 DIAGNOSIS — B370 Candidal stomatitis: Secondary | ICD-10-CM | POA: Diagnosis not present

## 2016-07-07 DIAGNOSIS — K5900 Constipation, unspecified: Secondary | ICD-10-CM

## 2016-07-07 MED ORDER — NYSTATIN 100000 UNIT/ML MT SUSP: 5.0000 mL | Freq: Four times a day (QID) | OROMUCOSAL | 0 refills | Status: DC

## 2016-07-07 NOTE — Patient Instructions (Addendum)
It was nice to see you all again. I prescribed nystatin swish and swallow. She should swish this in her mouth 4 times per day and try to keep it in her mouth for a minute or 2 before she swallows. Typically the treatment is effective in 1-2 weeks. I am giving her enough for 2 weeks supply.  If her lip movements do not resolve after the white plaques have resolved, please follow back up with [place].  For normal bowel movements, it is important that she increase her fluid intake. She should be drinking approximately 7- 8 ounce glasses of fluid per day. Additionally, increase her fiber intake. This can be found on things like pears, peaches, mangoes, and vegetables.   Constipation, Adult Constipation is when a person:  Poops (has a bowel movement) fewer times in a week than normal.  Has a hard time pooping.  Has poop that is dry, hard, or bigger than normal. Follow these instructions at home: Eating and drinking  Eat foods that have a lot of fiber, such as:  Fresh fruits and vegetables.  Whole grains.  Beans.  Eat less of foods that are high in fat, low in fiber, or overly processed, such as:  [place] fries.  Hamburgers.  Cookies.  Candy.  Soda.  Drink enough fluid to keep your pee (urine) clear or pale yellow. General instructions  Exercise regularly or as told by your doctor.  Go to the restroom when you feel like you need to poop. Do not hold it in.  Take over-the-counter and prescription medicines only as told by your doctor. These include any fiber supplements.  Do pelvic floor retraining exercises, such as:  Doing deep breathing while relaxing your lower belly (abdomen).  Relaxing your pelvic floor while pooping.  Watch your condition for any changes.  Keep all follow-up visits as told by your doctor. This is important. Contact a doctor if:  You have pain that gets worse.  You have a fever.  You have not pooped for 4 days.  You throw up (vomit).  You  are not hungry.  You lose weight.  You are bleeding from the anus.  You have thin, pencil-like poop (stool). Get help right away if:  You have a fever, and your symptoms suddenly get worse.  You leak poop or have blood in your poop.  Your belly feels hard or bigger than normal (is bloated).  You have very bad belly pain.  You feel dizzy or you faint. This information is not intended to replace advice given to you by your health care provider. Make sure you discuss any questions you have with your health care provider. Document Released: 11/29/2007 Document Revised: 12/31/2015 Document Reviewed: 12/01/2015 Elsevier Interactive Patient Education  2017 Elsevier Inc.    Oral Thrush, Adult Oral thrush is an infection in your mouth and throat. It causes white patches on your tongue and in your mouth. Follow these instructions at home: Helping with soreness  To lessen your pain:  Drink cold liquids, like water and iced tea.  Eat frozen ice pops or frozen juices.  Eat foods that are easy to swallow, like gelatin and ice cream.  Drink from a straw if the patches in your mouth are painful. General instructions  Take or use over-the-counter and prescription medicines only as told by your doctor. Medicine for oral thrush may be something to swallow, or it may be something to put on the infected area.  Eat plain yogurt that has live cultures  in it. Read the label to make sure.  If you wear dentures:  Take out your dentures before you go to bed.  Brush them well.  Soak them in a denture cleaner.  Rinse your mouth with warm salt-water many times a day. To make the salt-water mixture, completely dissolve 1/2-1 teaspoon of salt in 1 cup of warm water. Contact a doctor if:  Your problems are getting worse.  Your problems do not get better in less than 7 days with treatment.  Your infection is spreading. This may show as white patches on the skin outside of your mouth.  You  are nursing your baby and you have redness and pain in the nipples. This information is not intended to replace advice given to you by your health care provider. Make sure you discuss any questions you have with your health care provider. Document Released: 09/06/2009 Document Revised: 03/06/2016 Document Reviewed: 03/06/2016 Elsevier Interactive Patient Education  2017 [employer]

## 2016-07-07 NOTE — Progress Notes (Signed)
    Subjective: CC: thrush and flu vaccine  HPI: Patient is a 26 y.o. female with a past medical history of epilepsy, CP, congenital quadriplegia, dysphagia  presenting to clinic today for concerns for thrush.  2 weeks ago mom noted lip smacking. Last week, she noted white stuff on the patient's tongue.   Eating and drinking okay (she gets pureed foods). No pain with swallowing however she told mom her tongue does hurt. She's on Carbatrol which she's been on since she was a child, not on any anti-psychotics.   She doesn't have regular BMs; typically less than once a week.  When she does have a BM,  they're big and hard. She is very uncomfortable defecating. No blood in stools or hemorrhoids.  Drinks 16oz flavored water per day.  Doesn't like fruits and vegetables. She wears diapers due to incontinence, however mom takes them off and puts the patient on the toilet in hopes of making the BM come more easily. Mom curious what she can do to help patient.    Social History: non smoker, mother is primary caretaker.   Flu Vaccine: would like today   ROS: All other systems reviewed and are negative.  Past Medical History Patient Active Problem List   Diagnosis Date Noted  . Thrush 07/09/2016  . Constipation 07/09/2016  . Dysphagia 10/25/2015  . 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  . 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 Scoliosis associated with other condition 05/19/2013  . Unspecified disorder of eye movements 05/19/2013  . 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 Unspecified urinary incontinence 05/19/2013  . Unspecified congenital cataract 05/19/2013  . Cerebral palsy (HCC) 07/06/2011  . Seizure disorder (HCC) 07/06/2011  . Heart murmur 07/06/2011  . Healthcare maintenance 07/06/2011    Medications- reviewed and  updated  Objective: Office vital signs reviewed. BP 126/82   Pulse 80   LMP 06/03/2016   SpO2 98%    Physical Examination:  General: Awake, alert, well- nourished, NAD sitting up in wheel chair. Intermittently smack lips but not other abnormal facial movements.  ENMT:  MMM, white plaques noted over the tongue, none noted on the buccal mucosa.  Oropharynx clear without erythema or tonsillar exudate/hypertrophy Eyes: Conjunctiva non-injected.  Cardio: RRR, diastolic murmur, no thrill.  Pulm: No increased WOB.  CTAB, without wheezes, rhonchi or crackles noted.  GI: soft, NT/ND,+BS x4.   Assessment/Plan: Thrush Exam and history consistent with thrush.  Not on antipsychotics that could be causing lip smacking. Continues to eat despite reported tongue pain.  - rx for nystatin swish and swallow four times per day for 1-2 weeks until complete resolution. - discussed returning if mom notes lip movements continue despite resolution in white plaques  Constipation Most likely secondary to diet. Discussed increasing fluid intake initially in addition to increasing fibers. Given discomfort with defecation, discussed using MiraLax in the short term, however advised ideally this would be controlled by diet alone.   Orders Placed This Encounter  Procedures  . Flu Vaccine QUAD 36+ mos IM    Meds ordered this encounter  Medications  . nystatin (MYCOSTATIN) 100000 UNIT/ML suspension    Sig: Take 5 mLs (500,000 Units total) by mouth 4 (four) times daily. Swish and swallow.    Dispense:  300 mL    Refill:  0    Joanna Puffrystal S. Dorsey PGY-3, [facility]

## 2016-07-09 ENCOUNTER — Encounter: Payer: Self-pay | Admitting: Family Medicine

## 2016-07-09 DIAGNOSIS — K5900 Constipation, unspecified: Secondary | ICD-10-CM | POA: Insufficient documentation

## 2016-07-09 DIAGNOSIS — B370 Candidal stomatitis: Secondary | ICD-10-CM | POA: Insufficient documentation

## 2016-07-09 NOTE — Assessment & Plan Note (Signed)
Exam and history consistent with thrush.  Not on antipsychotics that could be causing lip smacking. Continues to eat despite reported tongue pain.  - rx for nystatin swish and swallow four times per day for 1-2 weeks until complete resolution. - discussed returning if mom notes lip movements continue despite resolution in white plaques

## 2016-07-09 NOTE — Assessment & Plan Note (Signed)
Most likely secondary to diet. Discussed increasing fluid intake initially in addition to increasing fibers. Given discomfort with defecation, discussed using MiraLax in the short term, however advised ideally this would be controlled by diet alone.

## 2016-07-17 ENCOUNTER — Encounter (INDEPENDENT_AMBULATORY_CARE_PROVIDER_SITE_OTHER): Payer: Self-pay | Admitting: Pediatrics

## 2016-07-17 ENCOUNTER — Ambulatory Visit (INDEPENDENT_AMBULATORY_CARE_PROVIDER_SITE_OTHER): Payer: Medicaid Other | Admitting: Pediatrics

## 2016-07-17 VITALS — BP 100/60 | HR 84

## 2016-07-17 DIAGNOSIS — G40309 Generalized idiopathic epilepsy and epileptic syndromes, not intractable, without status epilepticus: Secondary | ICD-10-CM | POA: Diagnosis not present

## 2016-07-17 DIAGNOSIS — G40209 Localization-related (focal) (partial) symptomatic epilepsy and epileptic syndromes with complex partial seizures, not intractable, without status epilepticus: Secondary | ICD-10-CM

## 2016-07-17 DIAGNOSIS — M4145 Neuromuscular scoliosis, thoracolumbar region: Secondary | ICD-10-CM | POA: Diagnosis not present

## 2016-07-17 DIAGNOSIS — F70 Mild intellectual disabilities: Secondary | ICD-10-CM | POA: Diagnosis not present

## 2016-07-17 DIAGNOSIS — G808 Other cerebral palsy: Secondary | ICD-10-CM | POA: Diagnosis not present

## 2016-07-17 MED ORDER — GABAPENTIN 100 MG PO CAPS: ORAL_CAPSULE | ORAL | 5 refills | Status: DC

## 2016-07-17 MED ORDER — CARBATROL 200 MG PO CP12: ORAL_CAPSULE | ORAL | 5 refills | Status: DC

## 2016-07-17 NOTE — Progress Notes (Signed)
Patient: Kristin Fitzgerald MRN: [id number] Sex: female DOB: 1990/09/20  Provider: Ellison Carwin, MD Location of Care: [facility]  Note type: Routine return visit  History of Present Illness: Referral Source: [facility]/Dr. Elwyn Reach History from: mother, patient and [facility] chart Chief Complaint: Seizures/Spastic Quadriparesis  [name] is a 26 y.o. female who returns July 17, 2016, for the first time since October 20, 2015.  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  Since her last visit, she had no seizures.  She takes and tolerates Carbatrol and gabapentin without side effects.  She has had some problems with dysphagia and choking.  Her mother has now switched to total pureed diet that she uses by blending food that she cooks.  [name] uses a small spoon and completely empties her mouth.  She had a barium swallow, which mother said was normal; however, showed mild, but functional oropharyngeal dysphagia, inability to lateralize her tongue during oral motor examination, mildly decreased sensation leading to swallow initiation at the valleculae.  There was a minimal vallecular residue, which cleared on a second swallow.  The larynx was normal and showed no evidence of penetration or aspiration.  The speech therapist evaluated this and suggested that the patient was probably taking bites that were too large, although at the end their appeared to be no laryngeal dysfunction.  [name] is able to feed herself independently.  She can wash her face and hands and help somewhat with the rest.  She is incontinent and wears diapers.  She helps with dressing.  She  is able to independently use her phone and her tablet.  She ambulates with a wheelchair.  She can be placed in a standing position to pivot transfer.  Her general health is good.  She goes to sleep around 9 p.m., but has trouble and very often stays up later.  In the morning, she often resist getting up.  Her mother says that her personality has become more difficult.  She shows anger, she talks back, and she says mean things to the people who are around her who are mostly family.  When mother spoke about this, Kanai started to smile.  I think that she has a self-awareness of her limitations that likely makes her angry, but her response to those were try to provide care for her is going to ultimately be counterproductive.  Her brother is her other caregiver.  She receives CAPS assistance at home about 2 to 3 hours a day.  Her mother comes home at lunchtime to see her.  She works at [employer] and apparently has a great deal cooperation from her employers.  She has not received the funding that she would need to be in a Adult Daycare Setting, which would be of great benefit to her from a social perspective.  Review of Systems: 12 system review was assessed and was negative  Past Medical History Diagnosis Date  . Cerebral palsy (HCC)   . Mild intellectual disabilities   . Seizure disorder (HCC)   . Seizures (HCC)    Hospitalizations: No., Head Injury: No., Nervous System Infections: No., Immunizations up to date: Yes.    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  Birth History 3 lbs. 1 oz. Infant born at 3830 weeks gestational age  Gestation was complicated by premature delivery for unknown reasons. Nursery Course was complicated by seizures a day 3 of life. The patient had no hemorrhage but had periventricular leukomalacia. Growth and Development was recalled as abnormal  Behavior  History Patient can be mean in her comments towards family members  Surgical History Procedure Laterality Date  . CARDIAC SURGERY     Repaired valve in 2007-[place]  . EYE SURGERY       bilateral eye surgery in infancy  . HIP ARTHROPLASTY     for recurrent dislocation; rebuilt acetabulum  . INGUINAL HERNIA REPAIR    . 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 OTHER SURGICAL HISTORY     [place] Surgery as an infant   Family History family history includes Asthma in her brother; Breast cancer in her maternal aunt; Kidney failure in her maternal grandmother; Leukemia in her maternal grandfather. Family history is negative for migraines, seizures, intellectual disabilities, blindness, deafness, birth defects, chromosomal disorder, or autism.  Social History . Marital status: Single    Spouse name: N/A  . Number of children: N/A  . Years of education: N/A   Social History Main Topics  . Smoking status: Passive Smoke Exposure - Never Smoker  . Smokeless tobacco: Never Used     Comment: Mother and brother smoke   . Alcohol use No  . Drug use: No  . Sexual activity: No   Social History Narrative    Annice PihJackie is a high [employer] graduate.    She lives with her mother and her brother Berna SpareMarcus.   Allergies Allergen Reactions  . Demerol Nausea And Vomiting   Physical Exam BP 100/60   Pulse 84   General: alert, well developed, well nourished, in no acute distress, brown hair, brown eyes, left handed Head: normocephalic, no dysmorphic features Ears, Nose and Throat: Otoscopic: tympanic membranes normal; pharynx: oropharynx is pink without exudates or tonsillar hypertrophy Neck: supple, full range of motion, no cranial or cervical bruits Respiratory: auscultation clear Cardiovascular: no murmurs, pulses are normal Musculoskeletal: Flexion contractures at the right elbow, both knees, tight heel cords, severe convex left neuromuscular scoliosis,  right hemiatrophy involving the arm more than the leg Skin: no rashes or neurocutaneous lesions  Neurologic Exam  Mental Status: alert; oriented to person; language is below normal for age, speech is dysarthric but intelligible; she is able to name objects and follow commands Cranial Nerves: visual fields are full to double simultaneous stimuli; extraocular movements are full and dysconjugate; left eye amblyopia with exotropia any: pupils are round reactive to light; funduscopic examination shows positive red reflex with severe photophobia; symmetric facial strength; midline tongue and uvula; localize sound bilaterally Motor: spastic quadriparesis with sparing of the left arm is tonic posturing of her fingers on the right, clumsiness in both hands unable to bring her thumb past her index finger bilaterally, 4/5 strength in the right arm 4+ or 5 in left arm 1-2/5 in the right leg 3/5 in the left leg.  She is unable to spontaneously move either foot.  She has significant limitation of range of motion in her right hip Sensory: intact responses to cold, vibration, proprioception; poor stereoagnosis Coordination: clumsy rapid repetitive alternating movements and finger apposition Gait and Station: essentially wheelchair-bound, able to bear weight on her extended legs for a pivot transfer Reflexes: symmetric and normal bilaterally; no clonus;  bilateral extensor plantar responses  Assessment 1. Generalized convulsive epilepsy, G40.309. 2. Partial epilepsy with impairment of consciousness, G40.209. 3. 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 4. Neuromuscular scoliosis of thoracolumbar region, M41.45. 5. Mild intellectual disability, F70.  Discussion Fortunately, [name]'s seizures are in good control and she is tolerating medication.  Her quadriplegia is stable.  Her neuromuscular scoliosis has not been checked in a while, it is very severe.  It is beyond the point where she could receive surgical repair without  having injury to her spinal cord during the procedure.  She is able to do a fair number of things for herself including entertain herself with her phone and tablet.  The change in her personality likely reflects an underlying anger and depression at her dependent state.  Plan I refilled her prescription for gabapentin and for Carbatrol.  She will return to see me in one years' time.  I will be happy to see her sooner based on clinical need.  I asked her mother to sign up for My Chart.  I spent 30 minutes of face-to-face time with [name] and her mother.   Medication List       Accurate as of 07/17/16  8:31 AM. Always use your most recent med list.          CARBATROL 200 MG 12 hr capsule Generic drug:  carbamazepine TAKE ONE CAPSULE BY MOUTH IN THE MORNING AND TAKE ONE CAPSULE AT BEDTIME   cetirizine HCl 5 MG/5ML Syrp Commonly known as:  Zyrtec Take 10 mLs (10 mg total) by mouth daily.   fluticasone 50 MCG/ACT nasal spray Commonly known as:  FLONASE Place 2 sprays into both nostrils daily.   gabapentin 100 MG capsule Commonly known as:  NEURONTIN TAKE ONE CAPSULE BY MOUTH EVERY MORNING AND TAKE 2 CAPSULES AT BEDTIME   nystatin 100000 UNIT/ML suspension Commonly known as:  MYCOSTATIN Take 5 mLs (500,000 Units total) by mouth 4 (four) times daily. Swish and swallow.     The medication list was reviewed and reconciled. All changes or newly prescribed medications were explained.  A complete medication list was provided to the patient/caregiver.  Deetta Perla MD

## 2016-07-17 NOTE — Patient Instructions (Signed)
Sign up for My Chart so that you can efficiently communicate with me between visits.

## 2016-08-22 ENCOUNTER — Telehealth: Payer: Self-pay | Admitting: Family Medicine

## 2016-08-22 NOTE — Telephone Encounter (Signed)
Will forward to MD that saw patient.  Erasmo DownerAngela M Damone Fancher, MD, MPH PGY-3,  [facility] 08/22/2016 5:27 PM

## 2016-08-22 NOTE — Telephone Encounter (Signed)
Mother called because her daughter was seen and diagnosed with thrush. She was given a mouthwash to use , the problem is that her daughter has cerebral palsy and she doesn't think that her daughter is swishing this is her mouth. She thinks she is just swallowing the medication. Her thrush is not getting better at all. She is wondering if there is something else she can us. Maybe a cream or a thicker type of medication that she swallow that would coat the mouth. Please call mother to discuss options. jw

## 2016-08-23 MED ORDER — NYSTATIN 100000 UNIT/ML MT SUSP: 5.0000 mL | Freq: Four times a day (QID) | OROMUCOSAL | 0 refills | Status: DC

## 2016-08-23 NOTE — Telephone Encounter (Signed)
Returned mom's call.  She felt pt's thrush was initially getting better, but then worsened. She is worried she wasn't swishing enough and since that time there's been no change.   We discussed possibly using clotrimazole torches, however mom is worried she'd chew the tablets instead of letting it dissolve. She is up for trying nystatin swish and swallow again.   Discussed that if she does not see improvement in 1 week, she should f/u in our clinic. Can discuss clotrimazole torches vs oral fluconazole in the future if needed.  Joanna Puffrystal S. Dorsey, MD [facility] Resident  08/23/2016, 2:31 PM

## 2016-12-24 ENCOUNTER — Encounter (HOSPITAL_COMMUNITY): Payer: Self-pay | Admitting: Emergency Medicine

## 2016-12-24 ENCOUNTER — Emergency Department (HOSPITAL_COMMUNITY)
Admission: EM | Admit: 2016-12-24 | Discharge: 2016-12-24 | Disposition: A | Discharge status: Home / Self Care | Payer: Medicaid Other | Attending: Emergency Medicine | Admitting: Emergency Medicine

## 2016-12-24 DIAGNOSIS — Z79899 Other long term (current) drug therapy: Secondary | ICD-10-CM | POA: Diagnosis not present

## 2016-12-24 DIAGNOSIS — Z87898 Personal history of other specified conditions: Secondary | ICD-10-CM

## 2016-12-24 DIAGNOSIS — Z7722 Contact with and (suspected) exposure to environmental tobacco smoke (acute) (chronic): Secondary | ICD-10-CM | POA: Insufficient documentation

## 2016-12-24 DIAGNOSIS — Z760 Encounter for issue of repeat prescription: Secondary | ICD-10-CM | POA: Diagnosis present

## 2016-12-24 MED ORDER — CARBAMAZEPINE 200 MG PO TABS
200.0000 mg | ORAL_TABLET | Freq: Once | ORAL | Status: AC
  Administered 2016-12-24: 200 mg via ORAL
  Filled 2016-12-24: qty 1

## 2016-12-24 NOTE — ED Triage Notes (Addendum)
Pt needs medication for seizures meds. A/OX4.

## 2016-12-24 NOTE — ED Provider Notes (Signed)
[facility]-EMERGENCY DEPT Provider Note   CSN: [id number] Arrival date & time: 12/24/16  2113  By signing my name below, I, Linna Darner, attest that this documentation has been prepared under the direction and in the presence of [employer], PA-C. Electronically Signed: Linna Darner, Scribe. 12/24/2016. 9:44 PM.  History   Chief Complaint Chief Complaint  Patient presents with  . Medication Refill   The history is provided by a relative. No language interpreter was used.   HPI Comments: Kristin Fitzgerald is a 26 y.o. female brought in by family, with PMHx including seizures and cerebral palsy, who presents to the Emergency Department requesting a single dose of her Carbatrol 200mg . Patient takes Carbatrol daily at 7 AM and 7 PM and took her dose this morning before running out of the medication. Family member called multiple pharmacies on patient's behalf but none had a single dose of Carbatrol available. She is here for one dose of Carbatrol and has a prescription pick up tomorrow at 8 AM when pharmacy opens. Family member reports that patient suffers from both absence and tonic clonic seizures. She also uses gabapentin to manage her seizures and currently has doses of this medication remaining. Patient has not had a seizure in years and is followed by Dr. Sharene Skeans of neurology. Patient follows up with him regularly and receives her Carbatrol and Gabapentin prescriptions from him. Per family member, patient denies fevers, chills, or any other associated symptoms.  Past Medical History:  Diagnosis Date  . Cerebral palsy (HCC)   . Mild intellectual disabilities   . Seizure disorder (HCC)   . Seizures [facility])     Patient Active Problem List   Diagnosis Date Noted  . Thrush 07/09/2016  . Constipation 07/09/2016  . Dysphagia 10/25/2015  . 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 Partial epilepsy with impairment  of consciousness (HCC) 05/19/2013  . 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 Scoliosis associated with other condition 05/19/2013  . Unspecified disorder of eye movements 05/19/2013  . 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 Unspecified urinary incontinence 05/19/2013  . Unspecified congenital cataract 05/19/2013  . Cerebral palsy (HCC) 07/06/2011  . Seizure disorder (HCC) 07/06/2011  . Heart murmur 07/06/2011  . Healthcare maintenance 07/06/2011    Past Surgical History:  Procedure Laterality Date  . CARDIAC SURGERY     Repaired valve in 2007-[place]  . EYE SURGERY       bilateral eye surgery in infancy  . HIP ARTHROPLASTY     for recurrent dislocation; rebuilt acetabulum  . INGUINAL HERNIA REPAIR    . 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 OTHER SURGICAL HISTORY     [facility] [place] [facility] as an infant    OB History    No data available       Home Medications    Prior to Admission medications   Medication Sig Start Date End Date Taking? Authorizing Provider  CARBATROL 200 MG 12 hr capsule TAKE ONE CAPSULE BY MOUTH IN THE MORNING AND TAKE ONE CAPSULE AT BEDTIME 07/17/16   Deetta Perla, MD  cetirizine HCl (ZYRTEC) 5 MG/5ML SYRP Take 10 mLs (10 mg total) by mouth daily. Patient taking differently: Take 10 mg by mouth daily as needed for allergies.  11/01/15   Erasmo Downer, MD  fluticasone (FLONASE) 50 MCG/ACT nasal spray Place 2 sprays into both nostrils daily. Patient taking differently: Place 2  sprays into both nostrils daily as needed for allergies or rhinitis.  03/23/15   Palumbo, April, MD  gabapentin (NEURONTIN) 100 MG capsule Take 1 capsule in the morning and 2 capsules at bedtime 07/17/16   Deetta Perla, MD  nystatin (MYCOSTATIN) 100000 UNIT/ML suspension Take 5 mLs (500,000 Units total) by mouth 4 (four) times daily. Swish and swallow. 08/23/16   Joanna Puff, MD    Family History Family History    Problem Relation Age of Onset  . Asthma Brother   . Kidney failure Maternal Grandmother        Died at 29  . Leukemia Maternal Grandfather        Died at 74  . Breast cancer Maternal Aunt        Dx in 2010, In remission    Social History Social History  Substance Use Topics  . Smoking status: Passive Smoke Exposure - Never Smoker  . Smokeless tobacco: Never Used     Comment: Mother and brother smoke   . Alcohol use No     Allergies   Demerol   Review of Systems Review of Systems  Constitutional: Negative for chills and fever.  Gastrointestinal: Negative for nausea.  Neurological: Negative for seizures.   Physical Exam Updated Vital Signs BP (!) 112/52 (BP Location: Right Arm)   Pulse 82   Temp 97.8 F (36.6 C) (Oral)   Resp 16   SpO2 100%   Physical Exam  Constitutional: She appears well-nourished.  Nontoxic appearing patient in wheelchair.  HENT:  Head: Normocephalic and atraumatic.  Right Ear: External ear normal.  Left Ear: External ear normal.  Eyes: Conjunctivae are normal. Right eye exhibits no discharge. Left eye exhibits no discharge. No scleral icterus.  Pulmonary/Chest: Effort normal. No respiratory distress.  Neurological: She is alert.  Skin: No pallor.  Psychiatric: She has a normal mood and affect.  Nursing note and vitals reviewed.  ED Treatments / Results  Labs (all labs ordered are listed, but only abnormal results are displayed) Labs Reviewed - No data to display  EKG  EKG Interpretation None       Radiology No results found.  Procedures Procedures (including critical care time)  DIAGNOSTIC STUDIES: Oxygen Saturation is 100% on RA, normal by my interpretation.    COORDINATION OF CARE: 9:43 PM Discussed treatment plan with pt's family member at bedside and she agreed to plan.  Medications Ordered in ED Medications  carbamazepine (TEGRETOL) tablet 200 mg (200 mg Oral Given 12/24/16 2205)     Initial Impression /  Assessment and Plan / ED Course  I have reviewed the triage vital signs and the nursing notes.  Pertinent labs & imaging results that were available during my care of the patient were reviewed by me and considered in my medical decision making (see chart for details).     26 year old female presenting today for single dose of seizure medication. Patient has not had seizure in several years and is followed closely by Dr. Sharene Skeans of neurology. Found consultation of pharmacy done to ensure that right medication was given in emergency department. Patient tolerated the dose and has no apparent adverse side effects on observation. I advised the patient to follow-up with her neurologist for further medication management and to get her prescription filled tomorrow and take medication as indicated. I advised the patient to return to the emergency department with new or worsening symptoms or new concerns. Specific return precautions discussed. The patient verbalized understanding and agreement  with plan. All questions answered. No further questions at this time.    Final Clinical Impressions(s) / ED Diagnoses   Final diagnoses:  Medication management  History of seizures    New Prescriptions Discharge Medication List as of 12/24/2016  9:53 PM     I personally performed the services described in this documentation, which was scribed in my presence. The recorded information has been reviewed and is accurate.     Princella PellegriniMaczis, Kyrra Prada M, PA-C 12/25/16 [id number]    Campos, Kevin, MD 12/26/16 [phone]

## 2016-12-24 NOTE — Discharge Instructions (Signed)
You were given a dose of your seizure medication today. Please fill your prescription tomorrow and take the medication as directed by your neurologist. Please follow-up with your neurologist for further medication management. You can return to the emergency department if you have new or worsening concerning symptoms

## 2016-12-24 NOTE — ED Notes (Signed)
Pt has a prescription but unable to get it filled tonight d/t pharmacy not having the medication. She needs 1 dose of her seizure medication to get her through tonight until the pharmacy can get the prescription in the morning.

## 2016-12-30 ENCOUNTER — Other Ambulatory Visit (INDEPENDENT_AMBULATORY_CARE_PROVIDER_SITE_OTHER): Payer: Self-pay | Admitting: Pediatrics

## 2016-12-30 DIAGNOSIS — G40309 Generalized idiopathic epilepsy and epileptic syndromes, not intractable, without status epilepticus: Secondary | ICD-10-CM

## 2016-12-30 DIAGNOSIS — G40209 Localization-related (focal) (partial) symptomatic epilepsy and epileptic syndromes with complex partial seizures, not intractable, without status epilepticus: Secondary | ICD-10-CM

## 2017-02-24 ENCOUNTER — Other Ambulatory Visit (INDEPENDENT_AMBULATORY_CARE_PROVIDER_SITE_OTHER): Payer: Self-pay | Admitting: Pediatrics

## 2017-02-24 DIAGNOSIS — G40209 Localization-related (focal) (partial) symptomatic epilepsy and epileptic syndromes with complex partial seizures, not intractable, without status epilepticus: Secondary | ICD-10-CM

## 2017-02-24 DIAGNOSIS — G40309 Generalized idiopathic epilepsy and epileptic syndromes, not intractable, without status epilepticus: Secondary | ICD-10-CM

## 2017-05-07 ENCOUNTER — Ambulatory Visit: Payer: Medicaid Other | Admitting: Family Medicine

## 2017-05-07 ENCOUNTER — Encounter: Payer: Self-pay | Admitting: Family Medicine

## 2017-05-07 VITALS — BP 90/70 | HR 90 | Temp 97.8°F

## 2017-05-07 DIAGNOSIS — B370 Candidal stomatitis: Secondary | ICD-10-CM

## 2017-05-07 DIAGNOSIS — Z23 Encounter for immunization: Secondary | ICD-10-CM | POA: Diagnosis present

## 2017-05-07 DIAGNOSIS — E639 Nutritional deficiency, unspecified: Secondary | ICD-10-CM | POA: Diagnosis not present

## 2017-05-07 NOTE — Assessment & Plan Note (Addendum)
No signs or symptoms of thrush at this visit. No white plagues seen on buccal mucosa or on the tongue. Mom did confirm she no longer uses fluticasone.  Mom's main complaint was continued lip smacking and I suspect this is due to a developed behavioral habit than and medical conditon.  I did inform mom that if the lip smacking continues to have follow up with her neurologist, Dr. Sharene SkeansHickling. I reviewed her medications and tardive dyskinesia is not a side effect of any of her current medications.

## 2017-05-07 NOTE — Assessment & Plan Note (Signed)
Mom is concerned that she is not getting enough protein due to her being on a puree only diet. She has been supplementing her with boost drinks several times per day and wanted to see if she could receive financial assistance with them due to cost.  Will send message to social worker Sammuel HinesDeborah Moore.

## 2017-05-07 NOTE — Patient Instructions (Addendum)
It was great to meet you today! Thank you for letting me participate in your care!  Today, we discussed your suspected oral thrush. She does not have any physical signs or symptoms or white plaques on her mouth or tongue today. She also does not have any problems eating and denies any pain on eating or swallowing. I suspect her lip smacking is due to behavioral habit that has developed over time.  If it does not improve with behavior modification please speak with Dr. Sharene SkeansHickling her neurologist.  Be well, Jules Schickim Clarnce Homan, DO PGY-1, [facility]

## 2017-05-07 NOTE — Progress Notes (Signed)
Subjective: No chief complaint on file.    HPI: Kristin Fitzgerald is a 26 y.o. presenting to clinic today to discuss the following:  1 Suspected Thrush 2 Annual Flu shot given 3 Nutrition  [name] is a 26y/o female with PMH of epilepsy, CP, congential quadriplegia, dysphagia who comes into the clinic today concerned about another episode of thrush. She states she has been treated in the past twice with oral medication but her main concern today is continued lip smacking.  History per mom is that she did not have a habit of lip smacking until she was diagnosed with thrush several months ago. She used an oral medication but since has continued to smack her lips constantly.  She also needs her annual flu shot.  Per mom, patient has lost weight and she was concerned she may not be getting enough protein. She has been giving her several Boosts drinks per day and wanted help with getting financial assistance for them.  Health Maintenance: Flu shot     ROS noted in HPI.   Past Medical, Surgical, Social, and Family History Reviewed & Updated per EMR.   Pertinent Historical Findings include:   Social History   Tobacco Use  Smoking Status Passive Smoke Exposure - Never Smoker  Smokeless Tobacco Never Used  Tobacco Comment   Mother and brother smoke       Objective: BP 90/70 (BP Location: Left Arm, Patient Position: Sitting, Cuff Size: Normal)   Pulse 90   Temp 97.8 F (36.6 C) (Oral)   SpO2 100%  Vitals and nursing notes reviewed  Physical Exam Gen: Alert and Oriented x 3, NAD HEENT: Normocephalic, atraumatic, PERRLA, EOMI, non-swollen, non-erythematous turbinates, non-erythematous pharyngeal mucosa, no exudates, no white plaques visible on the buccal mucosa or on the tongue. CV: RRR, no murmurs, normal S1, S2 split Resp: CTAB, no wheezing, rales, or rhonchi, comfortable work of breathing Abd: non-distended, non-tender, soft, +bs in all four quadrants,  no hepatosplenomegaly MSK: spastic quadraplegia of all extremities, wheelchair bound Ext: no clubbing, cyanosis, or edema Skin: warm, dry, intact, no rashes Psych: appropriate behavior, mood   No results found for this or any previous visit (from the past 72 hour(s)).  Assessment/Plan:  Thrush No signs or symptoms of thrush at this visit. No white plagues seen on buccal mucosa or on the tongue. Mom did confirm she no longer uses fluticasone.  Mom's main complaint was continued lip smacking and I suspect this is due to a developed behavioral habit than and medical conditon.  I did inform mom that if the lip smacking continues to have follow up with her neurologist, Dr. Sharene SkeansHickling. I reviewed her medications and tardive dyskinesia is not a side effect of any of her current medications.  Need for immunization against influenza Annual Flu shot given today.  Nutrition impaired due to limited access to healthful foods Mom is concerned that she is not getting enough protein due to her being on a puree only diet. She has been supplementing her with boost drinks several times per day and wanted to see if she could receive financial assistance with them due to cost.  Will send message to social worker Sammuel HinesDeborah Moore.      PATIENT EDUCATION PROVIDED: See AVS    Diagnosis and plan along with any newly prescribed medication(s) were discussed in detail with this patient today. The patient verbalized understanding and agreed with the plan. Patient advised if symptoms worsen return to clinic or ER.  Health Maintainance:   Orders Placed This Encounter  Procedures  . Flu Vaccine QUAD 36+ mos IM    No orders of the defined types were placed in this encounter.    Jules Schickim Jaymar Loeber, DO 05/07/2017, 11:11 AM PGY-1, [facility]

## 2017-05-07 NOTE — Assessment & Plan Note (Signed)
Annual Flu shot given today.

## 2017-05-08 ENCOUNTER — Telehealth: Payer: Self-pay | Admitting: Licensed Clinical Social Worker

## 2017-05-08 NOTE — Progress Notes (Signed)
Social work consult from Dr. Karen ChafeLockamy reference resources for Boost, food supplements .   LCSW called to assess needs ref. The above consult. Left message to call LCSW.  Plan: LCSW will call patient and mother Misty StanleyLisa in 3 to 5 days if no return call is received.  Sammuel Hineseborah Moore, LCSW Licensed Clinical Social Worker [facility]   [phone] 10:04 AM

## 2017-05-11 NOTE — Progress Notes (Signed)
2nd call to patient's mother ref. Resources and programs that will assist with Boost food supplements. LCSW spoke with patient's mother Misty StanleyLisa the follow was discussed: SNAP benefits, no community Midwifefinancial assistance supplement  Programs, offered samples of Ensure but patient does not drink Ensure; coupons in the news paper and on [employer] web site. Mother was appreciative of the call.  Plan: Patient's mother will look for coupons and go to Boost web site to print off more coupons  Sammuel Hineseborah Ananth Fiallos, LCSW Licensed Clinical Social Worker [facility]   [phone] 11:38 AM

## 2017-05-19 ENCOUNTER — Emergency Department (HOSPITAL_COMMUNITY)
Admission: EM | Admit: 2017-05-19 | Discharge: 2017-05-19 | Disposition: A | Discharge status: Home / Self Care | Payer: Medicaid Other | Attending: Emergency Medicine | Admitting: Emergency Medicine

## 2017-05-19 ENCOUNTER — Emergency Department (HOSPITAL_COMMUNITY): Payer: Medicaid Other

## 2017-05-19 ENCOUNTER — Encounter (HOSPITAL_COMMUNITY): Payer: Self-pay

## 2017-05-19 DIAGNOSIS — J069 Acute upper respiratory infection, unspecified: Secondary | ICD-10-CM | POA: Insufficient documentation

## 2017-05-19 DIAGNOSIS — B9789 Other viral agents as the cause of diseases classified elsewhere: Secondary | ICD-10-CM

## 2017-05-19 DIAGNOSIS — R05 Cough: Secondary | ICD-10-CM | POA: Diagnosis present

## 2017-05-19 DIAGNOSIS — Z96649 Presence of unspecified artificial hip joint: Secondary | ICD-10-CM | POA: Insufficient documentation

## 2017-05-19 DIAGNOSIS — G809 Cerebral palsy, unspecified: Secondary | ICD-10-CM | POA: Diagnosis not present

## 2017-05-19 DIAGNOSIS — F70 Mild intellectual disabilities: Secondary | ICD-10-CM | POA: Diagnosis not present

## 2017-05-19 DIAGNOSIS — Z79899 Other long term (current) drug therapy: Secondary | ICD-10-CM | POA: Insufficient documentation

## 2017-05-19 DIAGNOSIS — Z7722 Contact with and (suspected) exposure to environmental tobacco smoke (acute) (chronic): Secondary | ICD-10-CM | POA: Insufficient documentation

## 2017-05-19 NOTE — ED Provider Notes (Signed)
[facility] Provider Note   CSN: [id number] Arrival date & time: 05/19/17  1249     History   Chief Complaint No chief complaint on file.   HPI Kristin Fitzgerald is a 26 y.o. female.  HPI   26 year old female presents today with complaints of upper respiratory infection.  Mother provides history as patient has a history of CP, mild intellectual disabilities.  She notes that over the last 2 days patient has had cough and congestion and a low-grade fever at home.  Patient notes some anterior chest wall pain with coughing, no significant shortness of breath, no pain with inspiration.  Past Medical History:  Diagnosis Date  . Cerebral palsy (HCC)   . Mild intellectual disabilities   . Seizure disorder (HCC)   . Seizures [facility](HCC)     Patient Active Problem List   Diagnosis Date Noted  . Need for immunization against influenza 05/07/2017  . Nutrition impaired due to limited access to healthful foods 05/07/2017  . Thrush 07/09/2016  . Constipation 07/09/2016  . Dysphagia 10/25/2015  . 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  . 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 Scoliosis associated with other condition 05/19/2013  . Unspecified disorder of eye movements 05/19/2013  . 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 Unspecified urinary incontinence 05/19/2013  . Unspecified congenital cataract 05/19/2013  . Cerebral palsy (HCC) 07/06/2011  . Seizure disorder (HCC) 07/06/2011  . Heart murmur 07/06/2011  . Healthcare maintenance 07/06/2011    Past Surgical History:  Procedure Laterality Date  . CARDIAC SURGERY     Repaired valve in 2007-[place]  . EYE SURGERY       bilateral eye surgery in infancy  . HIP ARTHROPLASTY     for recurrent dislocation; rebuilt acetabulum  . INGUINAL  HERNIA REPAIR    . 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 OTHER SURGICAL HISTORY     [facility] [place] [facility] as an infant    OB History    No data available       Home Medications    Prior to Admission medications   Medication Sig Start Date End Date Taking? Authorizing Provider  CARBATROL 200 MG 12 hr capsule TAKE ONE CAPSULE BY MOUTH IN THE MORNING AND TAKE ONE CAPSULE AT BEDTIME 02/27/17   Deetta PerlaHickling, William H, MD  cetirizine HCl (ZYRTEC) 5 MG/5ML SYRP Take 10 mLs (10 mg total) by mouth daily. Patient taking differently: Take 10 mg by mouth daily as needed for allergies.  11/01/15   Bacigalupo, Marzella SchleinAngela M, MD  gabapentin (NEURONTIN) 100 MG capsule TAKE 1 CAPSULE IN THE MORNING AND 2 CAPSULES AT BEDTIME 01/01/17   Deetta PerlaHickling, William H, MD  nystatin (MYCOSTATIN) 100000 UNIT/ML suspension Take 5 mLs (500,000 Units total) by mouth 4 (four) times daily. Swish and swallow. 08/23/16   Joanna Pufforsey, Crystal S, MD    Family History Family History  Problem Relation Age of Onset  . Asthma Brother   . Kidney failure Maternal Grandmother        Died at 5774  . Leukemia Maternal Grandfather        Died at 8568  . Breast cancer Maternal Aunt        Dx in 2010, In remission    Social History Social History   Tobacco Use  . Smoking status: Passive Smoke  Exposure - Never Smoker  . Smokeless tobacco: Never Used  . Tobacco comment: Mother and brother smoke   Substance Use Topics  . Alcohol use: No    Alcohol/week: 0.0 oz  . Drug use: No     Allergies   Demerol   Review of Systems Review of Systems  All other systems reviewed and are negative.    Physical Exam Updated Vital Signs BP 109/65 (BP Location: Right Arm)   Pulse 78   Temp 98.8 F (37.1 C) (Oral) Comment: Probe was not all the way in mouth pt did not let me go back enough  Resp 16   SpO2 100%   Physical Exam  Constitutional: She is oriented to person, place, and time. She appears well-developed and  well-nourished.  HENT:  Head: Normocephalic and atraumatic.  Eyes: Conjunctivae are normal. Pupils are equal, round, and reactive to light. Right eye exhibits no discharge. Left eye exhibits no discharge. No scleral icterus.  Neck: Normal range of motion. No JVD present. No tracheal deviation present.  Pulmonary/Chest: Effort normal. No stridor.  Neurological: She is alert and oriented to person, place, and time. Coordination normal.  Psychiatric: She has a normal mood and affect. Her behavior is normal. Judgment and thought content normal.  Nursing note and vitals reviewed.    ED Treatments / Results  Labs (all labs ordered are listed, but only abnormal results are displayed) Labs Reviewed - No data to display  EKG  EKG Interpretation None       Radiology Dg Chest 2 View  Result Date: 05/19/2017 CLINICAL DATA:  26 year old female with cough and congestion and low-grade fever since Thursday. EXAM: CHEST  2 VIEW COMPARISON:  12167 FINDINGS: Cardiomediastinal silhouette is stable in size and configuration. Median sternotomy wires again noted. Slightly increased opacity at the right lung base may represent atelectasis versus artifact due to positioning. The left lung is clear. No sizeable effusions or pneumothorax. Osseous structures grossly unremarkable. IMPRESSION: No definite active disease within the limits of this evaluation. Increased opacity at the right lung base may represent atelectasis versus artifact due to positioning. Electronically Signed   By: Sande BrothersSerena  Chacko M.D.   On: 05/19/2017 14:17    Procedures Procedures (including critical care time)  Medications Ordered in ED Medications - No data to display   Initial Impression / Assessment and Plan / ED Course  I have reviewed the triage vital signs and the nursing notes.  Pertinent labs & imaging results that were available during my care of the patient were reviewed by me and considered in my medical decision making  (see chart for details).      Final Clinical Impressions(s) / ED Diagnoses   Final diagnoses:  Viral URI with cough    Labs:   Imaging: Chest 2 view  Consults:  Therapeutics:  Discharge Meds:   Assessment/Plan: 26 year old female presents today with likely viral URI.  Her chest x-ray has signs of atelectasis but no focal infection.  Patient is well-appearing in no acute distress, oxygen 100%, no signs of tachycardia or elevated fever.  Her lungs are clear, I feel she is safe for outpatient follow-up with the primary care if symptoms persist return precautions given.  Mother verbalized understanding and agreement to this plan had no further questions or concerns at the time of discharge.    ED Discharge Orders    None       Rosalio LoudHedges, Vika Buske, PA-C 05/19/17 1641    Raeford RazorKohut, Stephen, MD 05/20/17 1140

## 2017-05-19 NOTE — Discharge Instructions (Signed)
Please read attached information. If you experience any new or worsening signs or symptoms please return to the emergency room for evaluation. Please follow-up with your primary care provider or specialist as discussed.  °

## 2017-05-19 NOTE — ED Triage Notes (Signed)
Patient here with cough and congestion and low graded fever since Thursday evening, pain with cough and none with inspiration

## 2017-07-29 ENCOUNTER — Other Ambulatory Visit (INDEPENDENT_AMBULATORY_CARE_PROVIDER_SITE_OTHER): Payer: Self-pay | Admitting: Pediatrics

## 2017-07-29 DIAGNOSIS — G40209 Localization-related (focal) (partial) symptomatic epilepsy and epileptic syndromes with complex partial seizures, not intractable, without status epilepticus: Secondary | ICD-10-CM

## 2017-07-29 DIAGNOSIS — G40309 Generalized idiopathic epilepsy and epileptic syndromes, not intractable, without status epilepticus: Secondary | ICD-10-CM

## 2018-01-01 ENCOUNTER — Other Ambulatory Visit (INDEPENDENT_AMBULATORY_CARE_PROVIDER_SITE_OTHER): Payer: Self-pay | Admitting: Pediatrics

## 2018-01-01 DIAGNOSIS — G40209 Localization-related (focal) (partial) symptomatic epilepsy and epileptic syndromes with complex partial seizures, not intractable, without status epilepticus: Secondary | ICD-10-CM

## 2018-01-01 DIAGNOSIS — G40309 Generalized idiopathic epilepsy and epileptic syndromes, not intractable, without status epilepticus: Secondary | ICD-10-CM

## 2018-01-01 NOTE — Telephone Encounter (Signed)
This patient has not been seen in 18 months. How would you like to proceed?

## 2018-01-29 ENCOUNTER — Other Ambulatory Visit (INDEPENDENT_AMBULATORY_CARE_PROVIDER_SITE_OTHER): Payer: Self-pay | Admitting: Pediatrics

## 2018-01-29 DIAGNOSIS — G40209 Localization-related (focal) (partial) symptomatic epilepsy and epileptic syndromes with complex partial seizures, not intractable, without status epilepticus: Secondary | ICD-10-CM

## 2018-01-29 DIAGNOSIS — G40309 Generalized idiopathic epilepsy and epileptic syndromes, not intractable, without status epilepticus: Secondary | ICD-10-CM

## 2018-02-01 ENCOUNTER — Other Ambulatory Visit (INDEPENDENT_AMBULATORY_CARE_PROVIDER_SITE_OTHER): Payer: Self-pay | Admitting: Pediatrics

## 2018-02-01 DIAGNOSIS — G40309 Generalized idiopathic epilepsy and epileptic syndromes, not intractable, without status epilepticus: Secondary | ICD-10-CM

## 2018-02-01 DIAGNOSIS — G40209 Localization-related (focal) (partial) symptomatic epilepsy and epileptic syndromes with complex partial seizures, not intractable, without status epilepticus: Secondary | ICD-10-CM

## 2018-02-03 ENCOUNTER — Other Ambulatory Visit (INDEPENDENT_AMBULATORY_CARE_PROVIDER_SITE_OTHER): Payer: Self-pay | Admitting: Pediatrics

## 2018-02-03 DIAGNOSIS — G40309 Generalized idiopathic epilepsy and epileptic syndromes, not intractable, without status epilepticus: Secondary | ICD-10-CM

## 2018-02-03 DIAGNOSIS — G40209 Localization-related (focal) (partial) symptomatic epilepsy and epileptic syndromes with complex partial seizures, not intractable, without status epilepticus: Secondary | ICD-10-CM

## 2018-02-04 ENCOUNTER — Telehealth (INDEPENDENT_AMBULATORY_CARE_PROVIDER_SITE_OTHER): Payer: Self-pay | Admitting: Pediatrics

## 2018-02-04 DIAGNOSIS — G40209 Localization-related (focal) (partial) symptomatic epilepsy and epileptic syndromes with complex partial seizures, not intractable, without status epilepticus: Secondary | ICD-10-CM

## 2018-02-04 DIAGNOSIS — G40309 Generalized idiopathic epilepsy and epileptic syndromes, not intractable, without status epilepticus: Secondary | ICD-10-CM

## 2018-02-04 MED ORDER — CARBAMAZEPINE ER 200 MG PO CP12
ORAL_CAPSULE | ORAL | 0 refills | Status: DC
Start: 2018-02-04 — End: 2018-03-03

## 2018-02-04 NOTE — Telephone Encounter (Signed)
Who's calling (name and relationship to patient) : Misty StanleyLisa (mom)  Best contact number: [phone]  Provider they see: Sharene SkeansHickling   Reason for call: Need refill of medication, she is currently out.  Please fill today.       PRESCRIPTION REFILL ONLY  Name of prescription: Carbatrol 200mg    [facility] [address]

## 2018-02-04 NOTE — Telephone Encounter (Signed)
Rx has been printed and placed on Dr. Hickling's desk 

## 2018-02-04 NOTE — Telephone Encounter (Signed)
Rx has been faxed to the pharmacy 

## 2018-03-03 ENCOUNTER — Other Ambulatory Visit (INDEPENDENT_AMBULATORY_CARE_PROVIDER_SITE_OTHER): Payer: Self-pay | Admitting: Pediatrics

## 2018-03-03 DIAGNOSIS — G40309 Generalized idiopathic epilepsy and epileptic syndromes, not intractable, without status epilepticus: Secondary | ICD-10-CM

## 2018-03-03 DIAGNOSIS — G40209 Localization-related (focal) (partial) symptomatic epilepsy and epileptic syndromes with complex partial seizures, not intractable, without status epilepticus: Secondary | ICD-10-CM

## 2018-03-04 ENCOUNTER — Other Ambulatory Visit (INDEPENDENT_AMBULATORY_CARE_PROVIDER_SITE_OTHER): Payer: Self-pay | Admitting: Pediatrics

## 2018-03-04 DIAGNOSIS — G40309 Generalized idiopathic epilepsy and epileptic syndromes, not intractable, without status epilepticus: Secondary | ICD-10-CM

## 2018-03-04 DIAGNOSIS — G40209 Localization-related (focal) (partial) symptomatic epilepsy and epileptic syndromes with complex partial seizures, not intractable, without status epilepticus: Secondary | ICD-10-CM

## 2018-03-07 ENCOUNTER — Ambulatory Visit (INDEPENDENT_AMBULATORY_CARE_PROVIDER_SITE_OTHER): Payer: Medicaid Other | Admitting: Pediatrics

## 2018-03-07 ENCOUNTER — Encounter (INDEPENDENT_AMBULATORY_CARE_PROVIDER_SITE_OTHER): Payer: Self-pay | Admitting: Pediatrics

## 2018-03-07 VITALS — BP 110/70 | HR 60 | Wt 105.0 lb

## 2018-03-07 DIAGNOSIS — G40209 Localization-related (focal) (partial) symptomatic epilepsy and epileptic syndromes with complex partial seizures, not intractable, without status epilepticus: Secondary | ICD-10-CM | POA: Diagnosis not present

## 2018-03-07 DIAGNOSIS — G40309 Generalized idiopathic epilepsy and epileptic syndromes, not intractable, without status epilepticus: Secondary | ICD-10-CM

## 2018-03-07 DIAGNOSIS — G808 Other cerebral palsy: Secondary | ICD-10-CM

## 2018-03-07 DIAGNOSIS — M4145 Neuromuscular scoliosis, thoracolumbar region: Secondary | ICD-10-CM | POA: Diagnosis not present

## 2018-03-07 DIAGNOSIS — F70 Mild intellectual disabilities: Secondary | ICD-10-CM

## 2018-03-07 MED ORDER — GABAPENTIN 100 MG PO CAPS: ORAL_CAPSULE | ORAL | 5 refills | Status: DC

## 2018-03-07 MED ORDER — CARBATROL 200 MG PO CP12: 200.0000 mg | ORAL_CAPSULE | Freq: Two times a day (BID) | ORAL | 5 refills | Status: DC

## 2018-03-07 NOTE — Patient Instructions (Addendum)
[facility] [address], [place], [place] [address]  Hours:  Open Closes 12:30PM Reopens 1:30PM   Phone: [phone]

## 2018-03-07 NOTE — Progress Notes (Signed)
Patient: Kristin Fitzgerald MRN: [id number] Sex: female DOB: 02-14-1991  Provider: Ellison Carwin, MD Location of Care: [facility] Child Neurology  Note type: Routine return visit  History of Present Illness: Referral Source: [facility]/Dr. Elwyn Reach History from: mother and [facility] chart Chief Complaint: Seizures/Spastic Quadriparesis  Kristin Fitzgerald is a 27 y.o. female who  returns on March 07, 2018, for the first time since July 17, 2016.  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  It has been years since she has had any seizures.  She takes and tolerates Carbatrol and gabapentin without side effects.  She has issues with dysphagia and choking.  She takes a total pureed diet and liquid of regular consistency.  She is able to independently feed herself.  She is also able to independently push her wheelchair.  She is here today because her wheelchair has finally become unusable.  Mother had her in an old wheelchair that is the only thing that she has to transport Kristin Fitzgerald in public.  The wheels are small and she cannot reach them, therefore she cannot propel it herself.  The seat in the back of the chair are uncomfortable and thin, but it is portable and can be put into the car.  There is no head rest, no foot rest, and no harnesses to keep her in place.  I suspect that this wheelchair may be over 57 years old.  I carried out a face-to-face evaluation today for new wheelchair.  She needs a portable wheelchair where the wheels are large enough that she can push on them and propel herself.  It has to be small enough that it can be folded and  put in the back of a Zenaida Niece or backseat of a car.  It is necessary for safe transportation both around home and in public.  She needs a bolster in the center of the seat that separates her legs due to the spasticity in her proximal legs that causes abduction.  She needs a headrest for her head and the harness that will help keep her safely in her chair during transport, particularly when she is in a car.  In addition, there is another bolster in the back to help counteract her very severe left convex thoracolumbar scoliosis.  This wheelchair is medically necessary for the safe transport and for her comfort.  It could be used in the home or in public.  If the wheels are large enough, she will be independently able to push herself.  It is medically necessary that she receive this.  I have ordered this as a durable medical good and recommended the family go to [employer] to be measured and have the chair constructed and purchased.  Ocia graduated from [employer] and is out of school now.  She is at home with her mother.  Mother takes her out into public, but she is not able to be left alone at home and therefore goes places with family members.  She is followed at [facility] but has not seen a physician in quite some time.  Indeed, it had been 19 months since she was seen by me.    She goes to bed between 8:00 and 8:30 but is often not  asleep until 11:00 or 12:00.  She sits up looking at YouTube and when the battery on her device runs out, she goes to sleep.  She is up around 5:30 in the morning to be changed and receive medications.  Her mother has to go to work at that time.  She usually falls back asleep and her mother wakes her up when she comes home at lunch break at 11:00.  Mother has CAPS assistance 15 hours a week (3 hours per day for 5 days).  An aide comes in to the home and provides basic care and supervision.  Seizures are well controlled with gabapentin and Carbatrol.  Review  of Systems: A complete review of systems was remarkable for mom reports that patient has had no seizures. She also reports that the patient's wheelchair is not working anymore. the brakes do not work and the handles are broken as well, all other systems reviewed and negative.  Past Medical History Diagnosis Date  . Cerebral palsy (HCC)   . Mild intellectual disabilities   . Seizure disorder (HCC)   . Seizures (HCC)    Hospitalizations: No., Head Injury: No., Nervous System Infections: No., Immunizations up to date: Yes.    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  Birth History 3 lbs. 1 oz. Infant born at [redacted] weeks gestational age  Gestation was complicated by premature delivery for unknown reasons. Nursery Course was complicated by seizures a day 3 of life. The patient had no hemorrhage but had periventricular leukomalacia. Growth and Development was recalled as abnormal  Behavior History none  Surgical History Procedure Laterality Date  . CARDIAC SURGERY     Repaired valve in 2007-[place]  . EYE SURGERY       bilateral eye surgery in infancy  . HIP ARTHROPLASTY     for recurrent dislocation; rebuilt acetabulum  . INGUINAL HERNIA REPAIR    . 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 OTHER SURGICAL HISTORY     [place] [facility] as an infant   Family History family history includes Asthma in her brother; Breast cancer in her maternal aunt; Kidney failure in her maternal grandmother; Leukemia in her maternal grandfather. Family history is negative for migraines, seizures, intellectual disabilities, blindness, deafness, birth defects, chromosomal disorder, or autism.  Social History Socioeconomic History  . Marital status: Single  . Years of education:  41  . Highest education level:  High school certificate  Occupational History  . Not on file    Social Needs  . Financial resource strain: Not on file  . Food insecurity:    Worry: Not on file    Inability: Not on file  . Transportation needs:    Medical: Not on file    Non-medical: Not on file  Tobacco Use  . Smoking status: Passive Smoke Exposure - Never Smoker  . Smokeless tobacco: Never Used  . Tobacco comment: Mother and brother smoke   Substance and Sexual Activity  . Alcohol use: No    Alcohol/week: 0.0 standard drinks  . Drug use: No  . 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    She lives with her mother and her brother Berna Spare.   Allergies Allergen Reactions  . Demerol Nausea And Vomiting   Physical Exam BP 110/70   Pulse 60   Wt  105 lb (47.6 kg)   General: alert, well developed, well nourished, in no acute distress, black hair, brwon eyes, left handed Head: normocephalic, no dysmorphic features Ears, Nose and Throat: Otoscopic: tympanic membranes normal; pharynx: oropharynx is pink without exudates or tonsillar hypertrophy Neck: supple, full range of motion, no cranial or cervical bruits Respiratory: auscultation clear Cardiovascular: no murmurs, pulses are normal Musculoskeletal: Flexion contractures at the right elbow, both knees, tight heel cords, severe convex left neuromuscular scoliosis, right hemiatrophy involving the arm greater than the leg Skin: no rashes or neurocutaneous lesions  Neurologic Exam  Mental Status: alert; oriented to person, place and year; knowledge is below normal for age; language is below normal; dysarthria is significant and at times her speech is unintelligible, but she is able to communicate thoughts, contribute to history, and name objects and follow commands Cranial Nerves: visual fields are full to double simultaneous stimuli; extraocular movements are full and conjugate; pupils are round reactive to light; funduscopic examination shows bilateral positive red reflex with  severe photophobia; symmetric facial strength; midline tongue with good protrusion and lateral movement and uvula; she localizes sound bilaterally Motor: spastic quadriparesis with sparing of her left arm, dystonic posturing of the fingers on the right clumsiness in both hands; unable to bring her thumb past her index finger bilaterally, 4/5 strength in the right arm, 4+/5 in the left arm, 1-2/ 5 in the right leg, 3/5 in the left leg.  She is unable to spontaneously move either foot.  She has significant limitation of range of motion of the right hip. Sensory: intact responses to cold, vibration, proprioception and poor stereognosis Coordination: clumsy rapid repetitive alternating movements and finger apposition, reaching on the right is limited by dystonia Gait and Station: wheelchair-bound, is able to bear weight on her extended legs for pivot transfer Reflexes: symmetric and normal bilaterally; no clonus; bilateral extensor plantar responses  Assessment 1. Generalized convulsive epilepsy, G40.309. 2. Partial epilepsy with impairment of consciousness, G40.209. 3. 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 4. 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 5. Mild intellectual disability, F70.  Discussion I am pleased that the patient's seizures remain in good control.  I looked at prior x-rays of her back, the last of which was performed on October 20, 2015, and showed severe levoconvex scoliosis measuring 94 degrees from the T11-T12 disc space to the L4-L5.  There was normal segmentation.  There was a rotatory component beginning at T10, continuing throughout the lumbar spine.  The curvature was stable from 2015.  It was clear to me that this was such an extreme curve that she would not be a candidate for Harrington rod procedure.  Plan I wrote an order for the wheelchair and gave it to mother and recommended that she go to [employer].  This dictation will serve as a face-to-face encounter, which will  assist [employer] in filing a claim with Medicaid.  I asked the patient to return to see me in a year.  Greater than 50% of a 40-minute visit was spent in consultation and coordination of care concerning the face-to-face, writing prescriptions for the wheelchair and for her medications, reviewing the prior x-ray records.   Medication List    Accurate as of 03/07/18 11:59 PM.      CARBATROL 200 MG 12 hr capsule Generic drug:  carbamazepine Take 1 capsule (200 mg total) by mouth 2 (two) times daily.   cetirizine HCl 5 MG/5ML Syrp Commonly known as:  Zyrtec Take 10 mLs (10 mg total) by mouth daily.  gabapentin 100 MG capsule Commonly known as:  NEURONTIN TAKE 1 CAPSULE IN THE MORNING AND 2 CAPSULES AT BEDTIME   nystatin 100000 UNIT/ML suspension Commonly known as:  MYCOSTATIN Take 5 mLs (500,000 Units total) by mouth 4 (four) times daily. Swish and swallow.    The medication list was reviewed and reconciled. All changes or newly prescribed medications were explained.  A complete medication list was provided to the patient/caregiver.  Deetta Perla MD

## 2018-03-14 ENCOUNTER — Encounter (HOSPITAL_COMMUNITY): Payer: Self-pay | Admitting: Emergency Medicine

## 2018-03-14 ENCOUNTER — Emergency Department (HOSPITAL_COMMUNITY): Payer: Medicaid Other

## 2018-03-14 ENCOUNTER — Emergency Department (HOSPITAL_COMMUNITY)
Admission: EM | Admit: 2018-03-14 | Discharge: 2018-03-15 | Disposition: A | Discharge status: Home / Self Care | Payer: Medicaid Other | Attending: Emergency Medicine | Admitting: Emergency Medicine

## 2018-03-14 ENCOUNTER — Other Ambulatory Visit: Payer: Self-pay

## 2018-03-14 DIAGNOSIS — Y998 Other external cause status: Secondary | ICD-10-CM | POA: Diagnosis not present

## 2018-03-14 DIAGNOSIS — Z7722 Contact with and (suspected) exposure to environmental tobacco smoke (acute) (chronic): Secondary | ICD-10-CM | POA: Insufficient documentation

## 2018-03-14 DIAGNOSIS — Y9389 Activity, other specified: Secondary | ICD-10-CM | POA: Diagnosis not present

## 2018-03-14 DIAGNOSIS — S0081XA Abrasion of other part of head, initial encounter: Secondary | ICD-10-CM | POA: Insufficient documentation

## 2018-03-14 DIAGNOSIS — F70 Mild intellectual disabilities: Secondary | ICD-10-CM | POA: Insufficient documentation

## 2018-03-14 DIAGNOSIS — S0993XA Unspecified injury of face, initial encounter: Secondary | ICD-10-CM | POA: Diagnosis present

## 2018-03-14 DIAGNOSIS — M25562 Pain in left knee: Secondary | ICD-10-CM | POA: Diagnosis not present

## 2018-03-14 DIAGNOSIS — G809 Cerebral palsy, unspecified: Secondary | ICD-10-CM | POA: Insufficient documentation

## 2018-03-14 DIAGNOSIS — Y9289 Other specified places as the place of occurrence of the external cause: Secondary | ICD-10-CM | POA: Diagnosis not present

## 2018-03-14 DIAGNOSIS — W19XXXA Unspecified fall, initial encounter: Secondary | ICD-10-CM

## 2018-03-14 DIAGNOSIS — Z79899 Other long term (current) drug therapy: Secondary | ICD-10-CM | POA: Insufficient documentation

## 2018-03-14 DIAGNOSIS — Z96649 Presence of unspecified artificial hip joint: Secondary | ICD-10-CM | POA: Diagnosis not present

## 2018-03-14 DIAGNOSIS — W050XXA Fall from non-moving wheelchair, initial encounter: Secondary | ICD-10-CM | POA: Diagnosis not present

## 2018-03-14 DIAGNOSIS — T148XXA Other injury of unspecified body region, initial encounter: Secondary | ICD-10-CM

## 2018-03-14 NOTE — ED Triage Notes (Signed)
Per mom at bedside, Pt fell out of wheelchair and hit face on dirt. Pt has very small abrasion to face. Pt complaining of pain to L knee and face.

## 2018-03-14 NOTE — ED Provider Notes (Signed)
[facility] EMERGENCY DEPARTMENT Provider Note   CSN: [id number] Arrival date & time: 03/14/18  2053     History   Chief Complaint Chief Complaint  Patient presents with  . Fall    HPI Kristin Fitzgerald is a 27 y.o. female with history of cerebral palsy, wheelchair-bound and minimally verbal patient presenting after fall from wheelchair at football game tonight at approximately 7 PM.  Patient's mother at bedside states that patient has been using a new wheelchair and leaning forward, falling onto her left knee and landing on the ground.  Patient was quickly picked up by her brother and placed back into wheelchair.  Patient's only complaint at this time is left knee pain.  Patient is minimally verbal however she points to left knee and states that she feels pain there. When asked about other areas of pain patient shakes head and denies.   Patient also noted to have very small abrasion approximately 4 mm in diameter to the corner of her left mouth, no active bleeding, no bruising.  Patient denying pain to this area.  Patient behaviorally is at baseline per mother.  No vomiting or loss of consciousness per mother.  Patient's mother denies blood thinner use.  HPI  Past Medical History:  Diagnosis Date  . Cerebral palsy (HCC)   . Mild intellectual disabilities   . Seizure disorder (HCC)   . Seizures [facility])     Patient Active Problem List   Diagnosis Date Noted  . Need for immunization against influenza 05/07/2017  . Nutrition impaired due to limited access to healthful foods 05/07/2017  . Thrush 07/09/2016  . Constipation 07/09/2016  . Dysphagia 10/25/2015  . 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  . 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 Scoliosis associated with other condition 05/19/2013  . Unspecified  disorder of eye movements 05/19/2013  . 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 Unspecified urinary incontinence 05/19/2013  . Unspecified congenital cataract 05/19/2013  . Cerebral palsy (HCC) 07/06/2011  . Seizure disorder (HCC) 07/06/2011  . Heart murmur 07/06/2011  . Healthcare maintenance 07/06/2011    Past Surgical History:  Procedure Laterality Date  . CARDIAC SURGERY     Repaired valve in 2007-[place]  . EYE SURGERY       bilateral eye surgery in infancy  . HIP ARTHROPLASTY     for recurrent dislocation; rebuilt acetabulum  . INGUINAL HERNIA REPAIR    . 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 OTHER SURGICAL HISTORY     [place] [facility] as an infant     OB History   None      Home Medications    Prior to Admission medications   Medication Sig Start Date End Date Taking? Authorizing Provider  CARBATROL 200 MG 12 hr capsule Take 1 capsule (200 mg total) by mouth 2 (two) times daily. 03/07/18   Deetta Perla, MD  cetirizine HCl (ZYRTEC) 5 MG/5ML SYRP Take 10 mLs (10 mg total) by mouth daily. Patient taking differently: Take 10 mg by mouth daily as needed for allergies.  11/01/15   Bacigalupo, Marzella Schlein, MD  gabapentin (NEURONTIN) 100 MG capsule TAKE 1 CAPSULE IN THE MORNING AND 2 CAPSULES AT BEDTIME 03/07/18   Deetta Perla, MD  nystatin (MYCOSTATIN) 100000 UNIT/ML suspension Take 5 mLs (500,000 Units total) by mouth 4 (  four) times daily. Swish and swallow. 08/23/16   Joanna Puff, MD    Family History Family History  Problem Relation Age of Onset  . Asthma Brother   . Kidney failure Maternal Grandmother        Died at 7  . Leukemia Maternal Grandfather        Died at 63  . Breast cancer Maternal Aunt        Dx in 2010, In remission    Social History Social History   Tobacco Use  . Smoking status: Passive Smoke Exposure - Never Smoker  . Smokeless tobacco: Never Used  . Tobacco comment: Mother and  brother smoke   Substance Use Topics  . Alcohol use: No    Alcohol/week: 0.0 standard drinks  . Drug use: No     Allergies   Demerol   Review of Systems Review of Systems  Unable to perform ROS: Other  Patient with intellectual disability   Physical Exam Updated Vital Signs BP 123/65 (BP Location: Left Arm)   Pulse 73   Temp 98.5 F (36.9 C) (Oral)   Resp 16   SpO2 100%   Physical Exam  Constitutional: She appears well-nourished. No distress.  HENT:  Head: Normocephalic. Head is with abrasion. Head is without raccoon's eyes and without Battle's sign.    Right Ear: External ear normal. No hemotympanum.  Left Ear: External ear normal. No hemotympanum.  Nose: Nose normal.  Mouth/Throat: Uvula is midline, oropharynx is clear and moist and mucous membranes are normal. No oral lesions. No trismus in the jaw. No lacerations.  Eyes: Pupils are equal, round, and reactive to light.  Neck: Trachea normal, normal range of motion and full passive range of motion without pain. Neck supple. No neck rigidity. No tracheal deviation and normal range of motion present.  Cardiovascular: Intact distal pulses and normal pulses.  Pulses:      Dorsalis pedis pulses are 2+ on the right side, and 2+ on the left side.       Posterior tibial pulses are 2+ on the right side, and 2+ on the left side.  Pulmonary/Chest: Effort normal and breath sounds normal. No respiratory distress. She exhibits no crepitus and no deformity.  No signs of bruising or injury to the chest or back.  Abdominal: Soft. There is no tenderness. There is no rigidity, no rebound and no guarding.  No signs of bruising or injury to the abdomen.  Musculoskeletal: Normal range of motion. She exhibits no deformity.       Right knee: Normal.       Left knee: She exhibits normal range of motion, no swelling, no effusion, no ecchymosis, no deformity and no erythema. Tenderness found.       Right ankle: Normal.       Left ankle:  Normal.       Right lower leg: Normal.       Left lower leg: Normal.       Right foot: There is no deformity.       Left foot: There is no deformity.  Palpation of patient's body does not reveal obvious deformity, patient in no acute distress during examination, happy appearing.  All joints palpated with full passive range of motion and no obvious signs of distress.  Neurological: She is alert. GCS eye subscore is 4. GCS motor subscore is 6.  Patient moving all extremities spontaneously and without obvious signs of distress. Patient is alert, at baseline per mother.  Patient's mother denies abnormal behavior.  Skin: Skin is warm and dry. Capillary refill takes less than 2 seconds.  Psychiatric:  Normal per mother   ED Treatments / Results  Labs (all labs ordered are listed, but only abnormal results are displayed) Labs Reviewed - No data to display  EKG None  Radiology Dg Knee Complete 4 Views Left  Result Date: 03/14/2018 CLINICAL DATA:  Fall with knee pain EXAM: LEFT KNEE - COMPLETE 4+ VIEW COMPARISON:  None. FINDINGS: Bones appear osteopenic which limits the exam. No acute displaced fracture or malalignment. IMPRESSION: Limited by osteopenia.  No definite acute osseous abnormality Electronically Signed   By: Jasmine PangKim  Fujinaga M.D.   On: 03/14/2018 23:49    Procedures Procedures (including critical care time)  Medications Ordered in ED Medications - No data to display   Initial Impression / Assessment and Plan / ED Course  I have reviewed the triage vital signs and the nursing notes.  Pertinent labs & imaging results that were available during my care of the patient were reviewed by me and considered in my medical decision making (see chart for details).   Wheelchair-bound patient with history of cerebral palsy, minimally verbal presenting after fall that occurred at 7 PM.  Patient localizes pain to left knee.  Imaging negative for acute findings, patient with full range of  motion of left knee without crepitus or obvious signs of distress.  All other joints palpated and moved through full range of motion without obvious distress or limitation of range of motion.  Patient is happy appearing and cooperative during examination and in no acute distress.  Patient's left knee is normal-appearing, not warm, swollen or fluctuant.  No signs of infection or septic joint at this time. Patient neurovascularly intact to bilateral lower extremities.  Patient with small facial abrasion. Patient is at baseline per mother without abnormal behaviors.  There was no loss of consciousness after the fall, seizure activity or vomiting. Patient slid from chair first landing on left knee before laying forward onto ground, patient mother denies blood thinner use.  Patient with small abrasion to left corner of mouth, no dental abnormalities or mucosal lacerations or abnormalities found.  Abrasion is approximately 4 mm across, no active bleeding and no surrounding bruising.  No signs of head injury on examination, no hemotympanum, raccoon eyes or battle sign.  Patient moving all extremities as well as head and neck without obvious sign of distress.  Patient is afebrile, not tachycardic, not hypotensive, well-appearing and in no acute distress, happy and engaging.  At baseline per mother.  At this time there does not appear to be any evidence of an acute emergency medical condition and the patient appears stable for discharge with appropriate outpatient follow up. Diagnosis was discussed with patient and mother who verbalizes understanding of care plan and is agreeable to discharge. I have discussed return precautions with patient and mother who verbalize understanding of return precautions. Patient strongly encouraged to follow-up with their PCP. All questions answered.     Note: Portions of this report may have been transcribed using voice recognition software. Every effort was made to ensure  accuracy; however, inadvertent computerized transcription errors may still be present.  Final Clinical Impressions(s) / ED Diagnoses   Final diagnoses:  Fall, initial encounter  Acute pain of left knee  Abrasion    ED Discharge Orders    None       Elizabeth PalauMorelli, Genetta Fiero A, PA-C 03/15/18 0050    Chaney MallingYao, David  Hsienta, MD 03/15/18 1507

## 2018-03-14 NOTE — ED Notes (Signed)
Patient transported to X-ray 

## 2018-03-15 NOTE — Discharge Instructions (Addendum)
Please return to the Emergency Department for any new or worsening symptoms or if your symptoms do not improve. Please be sure to follow up with your Primary Care Physician as soon as possible regarding your visit today. If you do not have a Primary Doctor please use the resources below to establish one. Please use rest, ice and elevation to help with the pain. Please keep the minor abrasion to the face clean and dry.  Monitor for signs of infection including drainage, erythema or color change. The x-ray of the left knee today did not show signs of fracture.  However it did show some thinning of the bone, osteopenia, please be sure to follow-up with her primary care provider for further evaluation.  Contact a health care provider if: Your pain increases, and medicine does not help. Your joint pain does not improve within 3 days. You have increased bruising or swelling. You have a fever. You lose 10 lb (4.5 kg) or more without trying. Get help right away if: You are not able to move the joint. Your fingers or toes become numb or they turn cold and blue. Get help right away if: You have: A very bad (severe) headache that is not helped by medicine. Trouble walking or weakness in your arms and legs. Clear or bloody fluid coming from your nose or ears. Changes in your seeing (vision). Jerky movements that you cannot control (seizure). You throw up (vomit). Your symptoms get worse. You lose balance. Your speech is slurred. You pass out. You are sleepier and have trouble staying awake. The black centers of your eyes (pupils) change in size. RESOURCE GUIDE  Chronic Pain Problems: Contact [facility]  [phone] Patients need to be referred by their primary care doctor.  Insufficient Money for Medicine: Contact United Way:  call "211" or [facility] 314-351-3083.  No Primary Care Doctor: Call [facility]  [phone] - can help you locate a primary care doctor  that  accepts your insurance, provides certain services, etc. Physician Referral Service- [phone]  Agencies that provide inexpensive medical care: [facility]  [phone] [facility]  [phone] [facility]  [phone] [facility]  [phone] [facility]  [phone] [facility]  [phone]  Medicaid-accepting [facility] Providers: [facility]- [address], [address]  [phone], Mon-Fri 9am-7pm, Sat 9am-1pm [facility]- [address] [place], Suite [place]  [phone] [facility]- [address], [address] [place]  [phone] [facility]- [address]  [phone] Renaye Rakers- [address] [place], [address], [phone]  Only accepts [place] patients after they have their name  applied to their card  Self Pay (no insurance) in [facility]: Sickle Cell Patients: Dr Willey Blade, [facility] Internal Medicine  [address] [place], [phone] [facility]- [address] [place]  [phone]       [facility] [place]- [address]       -     [facility]- see information above (Speak to [employer] if you do not have insurance)       -  [facility]- [address] [place], [phone]       -  [facility]- [address]  [phone]       -  [facility]-  [address] [place] [address], [phone]       -  Dr Julio Sickssei-Bonsu-  [address], [address], [place], [phone]       -  [facility]- [address], [phone]       -  [facility] - [address] [place] [address], [phone], also [address], [phone]       -    [facility]- [address] [place], [phone], 1st & 3rd Saturday   every month, 10am-1pm  1) Find a Doctor and Pay Out of Pocket Although you won't have to find out who is covered by your insurance plan, it is a good idea to ask around  and get recommendations. You will then need to call the office and see if the doctor you have chosen will accept you as a new patient and what types of options they offer for patients who are self-pay. Some doctors offer discounts or will set up payment plans for their patients who do not have insurance, but you will need to ask so you aren't surprised when you get to your appointment.  2) Contact Your Local Health Department Not all health departments have doctors that can see patients for sick visits, but many do, so it is worth a call to see if yours does. If you don't know where your local health department is, you can check in your phone book. The CDC also has a tool to help you locate your state's health department, and many state websites also have listings of all of their local health departments.  3) Find a Walk-in Clinic If your illness is not likely to be very severe or complicated, you may want to try a walk in clinic. These are popping up all over the country in pharmacies, drugstores, and shopping centers. They're usually staffed by nurse practitioners or physician assistants that have been trained to treat common illnesses and complaints. They're usually fairly quick and inexpensive. However, if you have serious medical issues or chronic medical problems, these are probably not your best option  STD Testing [facility] of [facility] [place], STD Clinic, [address], [place], phone [phone] or [phone].  Monday - Friday, call for an appointment. [facility] of [facility] [place], STD Clinic, [place], [place], phone [phone] or [phone].  Monday - Friday, call for an appointment.  Abuse/Neglect: [facility] Child Abuse Hotline 639-213-2095(336) 438-601-9385 [facility] Child Abuse Hotline 616-129-9389(864) 465-9448 (After Hours)  Emergency Shelter:  [facility] [phone](336) 929-622-9460  Maternity Homes: [facility] [phone]([phone]) [phone] [facility] [phone]) [phone]  MRSA Hotline #:   337-409-4437772-801-4008  [facility] Resources  [facility] [place]  [employer] Way [facility]. [address]                 [address]         [address] [place]  [place]                                               Cristobal GoldmannW[place]                              [place] Phone:  [phone]  Phone:  [phone]                   Phone:  [phone]  [facility], [phone] [facility] [place]- [phone]       -     [facility] in [place], [address],                                  [phone], [facility] Child Abuse Hotline 561-690-7034 or (308)421-4793 (After Hours)   Behavioral Health Services  Substance Abuse Resources: [facility] and Drug Services  4018013731 [facility] [phone] [facility] 515-355-9231 Floydene Flock [phone] Residential & Outpatient Substance Abuse Program  [phone]  Psychological Services: [facility]  [phone] [facility]  ([phone] [facility], [phone] [place]. [address] [place], ACCESS LINE: (925)790-3790 or (334)107-8338, [employer]za  Dental Assistance  If unable to pay or uninsured, contact:  [facility] or [facility]. to become qualified for the adult dental clinic.  Patients with Medicaid: [facility] 620-024-0196 [address], [phone] [address], [phone]  If unable to pay, or uninsured, contact [facility] (806)456-7093) or [facility] Department [phone] in [place], [phone] in [facility]) to become qualified for the adult dental clinic   Other Low-Cost Community Dental Services: [facility]- [address] [place], [place], [place],  [address], [phone], Ext. 123, 2nd and 4th Thursday of the month at 6:30am.  10 clients each day by appointment, can sometimes see walk-in patients if someone does not show for an appointment. [facility]- [address] Ether Griffins [place], [place], [address], [phone] [facility] [address], [place], [place], [address], [phone] [facility] Health Department- [phone] [facility] Health Department- [phone] [facility] [phone]

## 2018-04-30 ENCOUNTER — Ambulatory Visit (INDEPENDENT_AMBULATORY_CARE_PROVIDER_SITE_OTHER): Payer: Medicaid Other

## 2018-04-30 DIAGNOSIS — Z23 Encounter for immunization: Secondary | ICD-10-CM

## 2018-05-09 ENCOUNTER — Telehealth (INDEPENDENT_AMBULATORY_CARE_PROVIDER_SITE_OTHER): Payer: Self-pay | Admitting: Pediatrics

## 2018-05-09 NOTE — Telephone Encounter (Signed)
L/M informing [employer] that we would need the forms faxed to the office in order to complete this. Please also enter a referral for PT. Gave them the fax number

## 2018-05-09 NOTE — Telephone Encounter (Signed)
°  Who's calling (name and relationship to patient) : Irving Burtonmily with [employer]  Best contact number: [phone]  Provider they see: Sharene SkeansHickling  Reason for call: Requesting we place a referral for physical therapy for patient to receive a wheelchair. They will need the referral, demographics, and OV notes. This is per the mother's request.      PRESCRIPTION REFILL ONLY  Name of prescription:  Pharmacy:

## 2018-05-21 NOTE — Telephone Encounter (Signed)
Irving Burtonmily from [employer] left voicemail to see if we have received the referral from them yet.

## 2018-05-22 NOTE — Telephone Encounter (Signed)
I returned Kristin Fitzgerald's call to inform her that we had not seen the forms. She stated that she has faxed them two other times. I informed her that we did not receive them. She sent them again. Form has been placed on Dr. Darl HouseholderHickling's desk

## 2018-08-05 ENCOUNTER — Ambulatory Visit: Payer: Medicaid Other | Attending: Pediatrics | Admitting: Physical Therapy

## 2018-08-05 ENCOUNTER — Other Ambulatory Visit: Payer: Self-pay

## 2018-08-05 DIAGNOSIS — G800 Spastic quadriplegic cerebral palsy: Secondary | ICD-10-CM | POA: Diagnosis present

## 2018-08-05 DIAGNOSIS — R29818 Other symptoms and signs involving the nervous system: Secondary | ICD-10-CM | POA: Diagnosis present

## 2018-08-05 DIAGNOSIS — R293 Abnormal posture: Secondary | ICD-10-CM | POA: Insufficient documentation

## 2018-08-05 DIAGNOSIS — M6281 Muscle weakness (generalized): Secondary | ICD-10-CM

## 2018-08-05 NOTE — Therapy (Addendum)
[facility] [address] [place], [place], [address] Phone: [phone]   Fax:  [phone]  Physical Therapy Evaluation  Patient Details  Name: Kristin Fitzgerald MRN: [id number] Date of Birth: [birth year] Referring Provider (PT): Jodi Geralds, MD   Encounter Date: 08/05/2018  PT End of Session - 08/05/18 1336    Visit Number  1    Number of Visits  1    Date for PT Re-Evaluation  08/05/18    Authorization Type  Medicaid    PT Start Time  1102    PT Stop Time  1215    PT Time Calculation (min)  73 min    Activity Tolerance  Patient tolerated treatment well    Behavior During Therapy  [facility] for tasks assessed/performed       Past Medical History:  Diagnosis Date  . Cerebral palsy ([place])   . Mild intellectual disabilities   . Seizure disorder ([place])   . Seizures ([place])     Past Surgical History:  Procedure Laterality Date  . CARDIAC SURGERY     Repaired valve in 2007-[place]  . EYE SURGERY       bilateral eye surgery in infancy  . HIP ARTHROPLASTY     for recurrent dislocation; rebuilt acetabulum  . INGUINAL HERNIA REPAIR    . 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 OTHER SURGICAL HISTORY     [place] [facility] as an infant    There were no vitals filed for this visit.   Subjective Assessment - 08/05/18 1333    Subjective  Pt arrives with mother in transport wheelchair.  Mother reports her manual wheelchair at home is so broken it is unsafe to use in the community.    Patient is accompained by:  Family member    Pertinent History  congenital cerebral palsy with quadriplegia, generalized convulsive epilepsy, scoliosis of thoracolumbar region, mild intellectual disability, heart murmur, and dysphagia.  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, inguinal  hernia repair and kidney surgery    Patient Stated Goals  to obtain a more appropriate manual wheelchair    Currently in Pain?  No/denies         [facility] PT Assessment - 08/05/18 1334      Assessment   Medical Diagnosis  Congenital cerebral palsy with quadriplegia    Referring Provider (PT)  Jodi Geralds, MD    Onset Date/Surgical Date  07/08/18    Hand Dominance  Left    Prior Therapy  yes when she was in school      Balance Screen   Has the patient fallen in the past 6 months  No      Prior Function   Level of Independence  Needs assistance with transfers;Needs assistance with ADLs;Other (comment)   wheelchair for in home mobility     Observation/Other Assessments   Focus on Therapeutic Outcomes (FOTO)   Not assessed         Mobility/Seating Evaluation    PATIENT INFORMATION: Name: Kristin Fitzgerald DOB: [birth year]  Sex: Female Date seen: 08/05/18 Time: 11:00 am  Address:  [place], [place] [address] Physician: Wyline Copas, MD This evaluation/justification form will serve as the LMN for the following suppliers: __________________________ Supplier: [employer] Contact Person: Deberah Pelton Phone:  [phone]   Seating Therapist: Misty Stanley, PT Phone:   (  [phone]   Phone: [phone]    Spouse/Parent/Caregiver name: Mother - Malijah Lietz  Phone number: [phone] Insurance/Payer: Medicaid     Reason for Referral: To obtain a new manual wheelchair  Patient/Caregiver Goals: To remain independent with home/community mobility with manual wheelchair  Patient was seen for face-to-face evaluation for new manual wheelchair.  Also present was Deberah Pelton, ATP and patient's mother Kristin Fitzgerald to discuss recommendations and wheelchair options.  Further paperwork was completed and sent to vendor.  Patient appears to qualify for manual mobility device at this time per objective findings.   MEDICAL HISTORY: Diagnosis: Primary Diagnosis: Spastic  Quadriplegic Cerebral Palsy  Onset: 1990 Diagnosis: generalized convulsive epilepsy   '[]' Progressive Disease Relevant past and future surgeries: Past surgeries include: Adductor tendon lengthening surgeries, cardiac valve replacement in 2007, eye surgery, hip arthroplasty to rebuild acetabulum due to recurrent dislocation, inguinal hernia repair and kidney surgery   Height: 50 inches Weight: 95 lb Explain recent changes or trends in weight: None   History including Falls: No falls; scoliosis of thoracolumbar region, mild intellectual disability, heart murmur, and dysphagia    HOME ENVIRONMENT: '[x]' House  '[]' Condo/town home  '[]' Apartment  '[]' Assisted Living    '[]' Lives Alone '[x]'  Lives with Others                                                                                          Hours with caregiver: When aide is not present pt is with mother or brother.  In home Aide is present 3 hours a day - helps with meals, transfers into/out of chair, bathing, dressing.    '[x]' Home is accessible to patient           Stairs      '[x]' Yes '[]'  No     Ramp '[]' Yes '[]' No Comments:  2 steps to enter the house but house is one level once inside.  Typically patient's family bumps her up/down 2 stairs in wheelchair but since wheelchair is broken, mother performs dependent carry from car > wheelchair positioned at the door.    COMMUNITY ADL: TRANSPORTATION: '[]' Car    '[x]' Van    '[]' Public Transportation    '[]' Adapted w/c Lift    '[]' Ambulance    '[]' Other:       '[]' Sits in wheelchair during transport  Employment/School: Graduated from high school in 2013; not currently involved in school or day program Specific requirements pertaining to mobility ?????  Other: ?????    FUNCTIONAL/SENSORY PROCESSING SKILLS:  Handedness:   '[]' Right     '[x]' Left    '[]' NA  Comments:  uses bilat UE to propel manual wheelchair  Functional Processing Skills for Wheeled Mobility '[x]' Processing Skills are adequate for safe wheelchair operation  Areas of  concern than may interfere with safe operation of wheelchair Description of problem   '[]'  Attention to environment      '[]' Judgment      '[]'  Hearing  '[]'  Vision or visual processing      '[]' Motor Planning  '[]'  Fluctuations in Behavior  ?????    VERBAL COMMUNICATION: '[x]' WFL receptive '[x]'  WFL expressive '[x]' Understandable  '[]' Difficult to understand  '[]' non-communicative '[]'  Uses an augmented  communication device  CURRENT SEATING / MOBILITY: Current Mobility Base:  '[]' None '[]' Dependent '[x]' Manual '[]' Scooter '[]' Power  Type of Control: ?????  Manufacturer:  Zippie GS Size:  12 x 12Age: 2014  Current Condition of Mobility Base:  Poor, brakes are broken - continues to use manual wheelchair in the home but has use transport w/c in community due to condition of manual wheelchair   Current Wheelchair components:  Planar back, planar cushion, abductor pad, lateral supports, head rest, tubular flip back arm rests, lift off leg rests with flip up foot plates with heel strap, drive wheels with push rims, brake extenders, push canes, chest harness  Describe posture in present seating system:  Pt transported to evaluation today in transport wheelchair.  Mother states current chair is not safe to bring out in the community.  In transport wheelchair pt arm rests are too high and sits with axilla resting on arm rests, leans upper trunk to R, neck laterally flexed to L, when sitting upright pt has >70% weight on L greater trochanter, bilat femurs adducted, back support too high and hits at upper thoracic spine, no low back support with lumbar spine in anterior tilt        SENSATION and SKIN ISSUES: Sensation '[x]' Intact  '[]' Impaired '[]' Absent  Level of sensation: ????? Pressure Relief: Able to perform effective pressure relief :    '[]' Yes  '[x]'  No Method: ???? If not, Why?: Pt unable to shift weight or stand; can communicate when in discomfort and family assists with transferring her out of chair for rest/pressure relief  Skin  Issues/Skin Integrity Current Skin Issues  '[]' Yes '[x]' No '[]' Intact '[]'  Red area'[]'  Open Area  '[]' Scar Tissue '[x]' At risk from prolonged sitting Where  ?????  History of Skin Issues  '[]' Yes '[x]' No Where  ????? When  ?????  Hx of skin flap surgeries  '[]' Yes '[x]' No Where  ????? When  ?????  Limited sitting tolerance '[x]' Yes '[]' No Hours spent sitting in wheelchair daily: 4-5 hours at a time  Complaint of Pain:  Please describe: mild LE pain -  pt requests pillow between legs to keep them spaced apart   Swelling/Edema: No   ADL STATUS (in reference to wheelchair use):  Indep Assist Unable Indep with Equip Not assessed Comments  Dressing ????? X ????? ????? ????? ?????  Eating ????? ????? ????? X ????? seated in w/c at kitchen table; set up assist - eats a pureed diet  Toileting ????? ????? X ????? ????? ?????  Bathing ????? X ????? ????? ????? takes wheelchair into bathroom and family/aide transfers her to tub seat  Grooming/Hygiene ????? X ????? ????? ????? seated in wheelchair at sink  Meal Prep ????? ????? X ????? ????? ?????  IADLS ????? ????? X ????? ????? ?????  Bowel Management: '[]' Continent  '[x]' Incontinent  '[]' Accidents Comments:  total A for diaper changes  Bladder Management: '[]' Continent  '[x]' Incontinent  '[]' Accidents Comments:  total A for diaper changes     WHEELCHAIR SKILLS: Manual w/c Propulsion: '[x]' UE or LE strength and endurance sufficient to participate in ADLs using manual wheelchair Arm : '[]' left '[]' right   '[x]' Both      Distance: household distances 50-100' Foot:  '[]' left '[]' right   '[]' Both  Operate Scooter: '[]'  Strength, hand grip, balance and transfer appropriate for use '[]' Living environment is accessible for use of scooter  Operate Power w/c:  '[]'  Std. Joystick   '[]'  Alternative Controls Indep '[]'  Assist '[]'  Dependent/unable '[]'  N/A '[x]'   '[]' Safe          '[]'   Functional      Distance: ?????  Bed confined without wheelchair '[x]'  Yes '[]'  No   STRENGTH/RANGE OF MOTION:  Active and Passive  Range of Motion Strength  Shoulder R: 110 flex L: 145 flex R: 3/5, L: 3+/5   Elbow R: 130 deg extension; L: WFL R: 3+/5, L: 4-/5  Wrist/Hand WFL flexion and extension wrist and digits R: 3+/5, L: 4-/5  Hip R: 80 deg hip flexion; lacking 10 deg to neutral extension    L: >90 flexion, lacking 10 deg to neutral extension  0/5  Knee R: 110 flexion; lacking 30 deg to full extension.  L: 135 flexion, lacking 30 deg to full extension 2/5  Ankle R/L: can reach neutral 90 deg  1/5     MOBILITY/BALANCE:  '[]'  Patient is totally dependent for mobility  ?????    Balance Transfers Ambulation  Sitting Balance: Standing Balance: '[]'  Independent '[]'  Independent/Modified Independent  '[]'  WFL     '[]'  WFL '[]'  Supervision '[]'  Supervision  '[x]'  Uses UE for balance  '[]'  Supervision '[]'  Min Assist '[]'  Ambulates with Assist  ?????    '[]'  Min Assist '[]'  Min assist '[]'  Mod Assist '[]'  Ambulates with Device:      '[]'  RW  '[]'  StW  '[]'  Cane  '[]'  ?????  '[]'  Mod Assist '[]'  Mod assist '[]'  Max assist   '[]'  Max Assist '[]'  Max assist '[x]'  Dependent '[]'  Indep. Short Distance Only  '[]'  Unable '[x]'  Unable '[]'  Lift / Sling Required Distance (in feet)  ?????   '[]'  Sliding board '[x]'  Unable to Ambulate (see explanation below)  Cardio Status:  '[]' Intact  '[x]'  Impaired   '[]'  NA     heart murmur; valve replacement in 2007  Respiratory Status:  '[x]' Intact   '[]' Impaired   '[]' NA     ?????  Orthotics/Prosthetics: No longer wears AFO  Comments (Address manual vs power w/c vs scooter): The patient suffers from spastic quadriplegic cerebral palsy and mild cognitive impairments which limits her ability to perform daily activities such as transfers, ambulating, bathing, dressing, grooming and toileting in the home.  The patient is unable to safely utilize a cane, crutch or walker due to significant postural deformities, paralysis of bilat LE and joint contractures.  Patient does have sufficient UE strength, ROM and cognition to safely propel a manual wheelchair in her home and  short distances in the community but requires custom molded seating due to fixed, severe spinal scoliosis and pelvic obliquity.  Patient lacks sufficient sitting balance and trunk control to safely utilize a scooter.  Due to mild cognitive impairments patient would be unable to safely utilize a power wheelchair.  Patient will have the assistance of her mother, aide and siblings to assist with home entry/exit by bumping her wheelchair up and down 2 short steps.          Anterior / Posterior Obliquity Rotation-Pelvis ?????  PELVIS    '[]'  '[]'  '[x]'   Neutral Posterior Anterior  '[]'  '[x]'  '[]'   WFL Rt elev Lt elev  '[]'  '[x]'  '[]'   WFL Right Left                      Anterior    Anterior     '[x]'  Fixed '[]'  Other '[]'  Partly Flexible '[]'  Flexible   '[x]'  Fixed '[]'  Other '[]'  Partly Flexible  '[]'  Flexible  '[x]'  Fixed '[]'  Other '[]'  Partly Flexible  '[]'  Flexible   TRUNK  '[]'  '[]'  '[x]'   WFL ? Thoracic ?  Lumbar  Kyphosis Lordosis  '[]'  '[]'  '[x]'   WFL Convex Convex  Right Left '[]' c-curve '[x]' s-curve '[x]' multiple  '[]'  Neutral '[]'  Left-anterior '[x]'  Right-anterior     '[x]'  Fixed '[]'  Flexible '[]'  Partly Flexible '[]'  Other  '[x]'  Fixed '[]'  Flexible '[]'  Partly Flexible '[]'  Other  '[x]'  Fixed             '[]'  Flexible '[]'  Partly Flexible '[]'  Other    Position Windswept  ?????  HIPS          '[]'            '[]'               '[x]'    Neutral       Abduct        ADduct         '[x]'           '[]'            '[]'   Neutral Right           Left      '[]'  Fixed '[]'  Subluxed '[x]'  Partly Flexible '[]'  Dislocated '[]'  Flexible  '[]'  Fixed '[]'  Other '[]'  Partly Flexible  '[]'  Flexible                 Foot Positioning Knee Positioning  ?????    '[x]'  WFL  '[x]' Lt '[x]' Rt '[x]'  WFL  '[x]' Lt '[x]' Rt    KNEES ROM concerns: ROM concerns:    & Dorsi-Flexed '[]' Lt '[]' Rt ?????    FEET Plantar Flexed '[]' Lt '[]' Rt      Inversion                 '[]' Lt '[]' Rt      Eversion                 '[]' Lt '[]' Rt     HEAD '[]'  Functional '[]'  Good Head Control  ?????  & '[]'  Flexed         '[]'  Extended '[x]'  Adequate Head  Control    NECK '[]'  Rotated  Lt  '[x]'  Lat Flexed Lt '[]'  Rotated  Rt '[]'  Lat Flexed Rt '[]'  Limited Head Control     '[]'  Cervical Hyperextension '[]'  Absent  Head Control     SHOULDERS ELBOWS WRIST& HAND ?????      Left     Right    Left     Right    Left     Right   U/E '[]' Functional           '[]' Functional WFL lacking 30 deg to full extension  '[]' Fisting             '[]' Fisting      '[x]' elev   '[]' dep      '[]' elev   '[]' dep       '[]' pro -'[x]' retract     '[]' pro  '[x]' retract '[]' subluxed             '[]' subluxed           Goals for Wheelchair Mobility  '[x]'  Independence with mobility in the home with motor related ADLs (MRADLs)  '[x]'  Independence with MRADLs in the community '[]'  Provide dependent mobility  '[]'  Provide recline     '[]' Provide tilt   Goals for Seating system '[x]'  Optimize pressure distribution '[x]'  Provide support needed to facilitate function or safety '[]'  Provide corrective forces to assist with maintaining or improving posture '[x]'  Accommodate client's posture:   current seated postures and positions are not flexible or will not tolerate corrective forces '[]'   Client to be independent with relieving pressure in the wheelchair '[x]' Enhance physiological function such as breathing, swallowing, digestion  Simulation ideas/Equipment trials:????? State why other equipment was unsuccessful:The patient suffers from spastic quadriplegic cerebral palsy and mild cognitive impairments which limits her ability to perform daily activities such as transfers, ambulating, bathing, dressing, grooming and toileting in the home.  The patient is unable to safely utilize a cane, crutch or walker due to significant postural deformities, paralysis of bilat LE and joint contractures.  Patient does have sufficient UE strength, ROM and cognition to safely propel a manual wheelchair in her home and short distances in the community but requires custom molded seating due to fixed, severe spinal scoliosis and pelvic obliquity.  Patient lacks  sufficient sitting balance and trunk control to safely utilize a scooter.  Due to mild cognitive impairments patient would be unable to safely utilize a power wheelchair.    MOBILITY BASE RECOMMENDATIONS and JUSTIFICATION: MOBILITY COMPONENT JUSTIFICATION  Manufacturer: ZippieModel: GS   Size: Width 14Seat Depth 14 '[x]' provide transport from point A to B      '[x]' promote Indep mobility  '[x]' is not a safe, functional ambulator '[x]' walker or cane inadequate '[x]' non-standard width/depth necessary to accommodate anatomical measurement '[]'  ?????  '[x]' Manual Mobility Base '[x]' non-functional ambulator    '[]' Scooter/POV  '[]' can safely operate  '[]' can safely transfer   '[]' has adequate trunk stability  '[]' cannot functionally propel manual w/c  '[]' Power Mobility Base  '[]' non-ambulatory  '[]' cannot functionally propel manual wheelchair  '[]'  cannot functionally and safely operate scooter/POV '[]' can safely operate and willing to  '[]' Stroller Base '[]' infant/child  '[]' unable to propel manual wheelchair '[]' allows for growth '[]' non-functional ambulator '[]' non-functional UE '[]' Indep mobility is not a goal at this time  '[]' Tilt  '[]' Forward '[]' Backward '[]' Powered tilt  '[]' Manual tilt  '[]' change position against gravitational force on head and shoulders  '[]' change position for pressure relief/cannot weight shift '[]' transfers  '[]' management of tone '[]' rest periods '[]' control edema '[]' facilitate postural control  '[]'  ?????  '[]' Recline  '[]' Power recline on power base '[]' Manual recline on manual base  '[]' accommodate femur to back angle  '[]' bring to full recline for ADL care  '[]' change position for pressure relief/cannot weight shift '[]' rest periods '[]' repositioning for transfers or clothing/diaper /catheter changes '[]' head positioning  '[]' Lighter weight required '[]' self- propulsion  '[]' lifting '[]'  ?????  '[]' Heavy Duty required '[]' user weight greater than 250# '[]' extreme tone/ over active movement '[]' broken frame on previous chair '[]'  ?????  '[x]'  Back   '[]'  Angle Adjustable '[x]'  Custom molded Severe scoliosis that cannot be accommodated by off the shelf back '[x]' postural control '[x]' control of tone/spasticity '[x]' accommodation of range of motion '[x]' UE functional control '[x]' accommodation for seating system '[]'  ????? '[x]' provide lateral trunk support '[x]' accommodate deformity '[x]' provide posterior trunk support '[x]' provide lumbar/sacral support '[x]' support trunk in midline '[x]' Pressure relief over spinal processes  '[x]'  Seat Cushion Custom Molded - severe obliquity that cannot be accommodated by off the shelf cushion  '[]' impaired sensation  '[]' decubitus ulcers present '[]' history of pressure ulceration '[]' prevent pelvic extension '[]' low maintenance  '[x]' stabilize pelvis  '[x]' accommodate obliquity '[x]' accommodate multiple deformity '[x]' neutralize lower extremity position '[x]' increase pressure distribution '[]'  ?????  '[]'  Pelvic/thigh support  '[]'  Lateral thigh guide '[]'  Distal medial pad  '[]'  Distal lateral pad '[]'  pelvis in neutral '[]' accommodate pelvis '[]'  position upper legs '[]'  alignment '[]'  accommodate ROM '[]'  decr adduction '[]' accommodate tone '[]' removable for transfers '[]' decr abduction  '[]'  Lateral trunk Supports '[]'  Lt     '[]'  Rt '[]' decrease lateral trunk leaning '[]' control tone '[]' contour for increased contact '[]' safety  '[]' accommodate asymmetry '[]'  ?????  [  x] Mounting hardware  '[]' lateral trunk supports  '[x]' back   '[x]' seat '[x]' headrest      '[x]'  thigh support   '[x]'  tray '[]' fixed   '[]' swing away '[x]' attach seat platform/cushion to w/c frame '[x]' attach back cushion to w/c frame '[]' mount postural supports '[x]' mount headrest  '[x]' swing medial thigh support away '[]' swing lateral supports away for transfers  '[x]'  Secure tray to wheelchair for UE support    Armrests  '[]' fixed '[x]' adjustable height '[]' removable   '[]' swing away  '[x]' flip back   '[]' reclining '[x]' full length pads '[]' desk    '[]' pads tubular  '[x]' provide support with elbow at 90   '[x]' provide support for w/c  tray '[x]' change of height/angles for variable activities '[x]' remove for transfers '[x]' allow to come closer to table top '[x]' remove for access to tables '[]'  ?????  Hangers/ Leg rests  '[]' 60 '[x]' 70 '[]' 90 '[]' elevating '[]' heavy duty  '[]' articulating '[]' fixed '[x]' lift off '[x]' swing away     '[]' power '[x]' provide LE support  '[x]' accommodate to hamstring tightness '[]' elevate legs during recline   '[]' provide change in position for Legs '[]' Maintain placement of feet on footplate '[]' durability '[x]' enable transfers '[]' decrease edema '[]' Accommodate lower leg length '[]'  ?????  Foot support Footplate    '[x]' Lt  '[x]'  Rt  '[]'  Center mount '[x]' flip up     '[x]' depth/angle adjustable '[]' Amputee adapter    '[]'  Lt     '[]'  Rt '[x]' provide foot support '[x]' accommodate to ankle ROM '[x]' transfers '[]' Provide support for residual extremity '[]'  allow foot to go under wheelchair base '[x]'  decrease tone  '[]'  ?????  '[x]'  Ankle strap/heel loops '[x]' support foot on foot support '[x]' decrease extraneous movement '[x]' provide input to heel  '[x]' protect foot  Tires: '[]' pneumatic  '[x]' flat free inserts  '[]' solid  '[x]' decrease maintenance  '[x]' prevent frequent flats '[]' increase shock absorbency '[]' decrease pain from road shock '[]' decrease spasms from road shock '[]'  ?????  '[x]'  Headrest  '[x]' provide posterior head support '[x]' provide posterior neck support '[x]' provide lateral head support '[]' provide anterior head support '[]' support during tilt and recline '[x]' improve feeding   '[]' improve respiration '[]' placement of switches '[x]' safety  '[x]' accommodate ROM  '[x]' accommodate tone '[x]' improve visual orientation  '[x]'  Anterior chest strap '[]'  Vest '[]'  Shoulder retractors  '[x]' decrease forward movement of shoulder '[]' accommodation of TLSO '[x]' decrease forward movement of trunk '[]' decrease shoulder elevation '[]' added abdominal support '[x]' alignment '[x]' assistance with shoulder control  '[]'  ?????  Pelvic Positioner '[x]' Belt '[]' SubASIS bar '[]' Dual Pull '[x]' stabilize tone '[x]' decrease  falling out of chair/ **will not Decr potential for sliding due to pelvic tilting '[]' prevent excessive rotation '[x]' pad for protection over boney prominence '[]' prominence comfort '[]' special pull angle to control rotation '[]'  ?????  Upper Extremity Support '[]' L   '[]'  R '[]' Arm trough    '[]' hand support '[]'  tray       '[x]' full tray '[]' swivel mount '[]' decrease edema      '[]' decrease subluxation   '[]' control tone   '[]' placement for AAC/Computer/EADL '[]' decrease gravitational pull on shoulders '[x]' provide midline positioning '[x]' provide support to increase UE function '[]' provide hand support in natural position '[x]' provide work surface   POWER WHEELCHAIR CONTROLS  '[]' Proportional  '[]' Non-Proportional Type ????? '[]' Left  '[]' Right '[]' provides access for controlling wheelchair   '[]' lacks motor control to operate proportional drive control '[]' unable to understand proportional controls  Actuator Control Module  '[]' Single  '[]' Multiple   '[]' Allow the client to operate the power seat function(s) through the joystick control   '[]' Safety Reset Switches '[]' Used to change modes and stop the wheelchair when driving in latch mode    '[]' Upgraded Electronics   '[]' programming for accurate control '[]' progressive Disease/changing condition '[]' non-proportional drive control needed '[]' Needed in order  to operate power seat functions through joystick control   '[]' Display box '[]' Allows user to see in which mode and drive the wheelchair is set  '[]' necessary for alternate controls    '[]' Digital interface electronics '[]' Allows w/c to operate when using alternative drive controls  '[]' ASL Head Array '[]' Allows client to operate wheelchair  through switches placed in tri-panel headrest  '[]' Sip and puff with tubing kit '[]' needed to operate sip and puff drive controls  '[]' Upgraded tracking electronics '[]' increase safety when driving '[]' correct tracking when on uneven surfaces  '[]' Mount for switches or joystick '[]' Attaches switches to w/c  '[]' Swing away for access or  transfers '[]' midline for optimal placement '[]' provides for consistent access  '[]' Attendant controlled joystick plus mount '[]' safety '[]' long distance driving '[]' operation of seat functions '[]' compliance with transportation regulations '[]'  ?????    Rear wheel placement/Axle adjustability '[]' None '[]' semi adjustable '[x]' fully adjustable  '[x]' improved UE access to wheels '[x]' improved stability '[x]' changing angle in space for improvement of postural stability '[]' 1-arm drive access '[]' amputee pad placement '[]'  ?????  Wheel rims/ hand rims  '[x]' metal  '[]' plastic coated '[]' oblique projections '[]' vertical projections '[x]' Provide ability to propel manual wheelchair  '[]'  Increase self-propulsion with hand weakness/decreased grasp  Push handles '[x]' extended  '[]' angle adjustable  '[]' standard '[x]' caregiver access '[x]' caregiver assist '[]' allows "hooking" to enable increased ability to perform ADLs or maintain balance  One armed device  '[]' Lt   '[]' Rt '[]' enable propulsion of manual wheelchair with one arm   '[]'  ?????   Brake/wheel lock extension '[x]'  Lt   '[x]'  Rt '[x]' increase indep in applying wheel locks   '[]' Side guards '[]' prevent clothing getting caught in wheel or becoming soiled '[]'  prevent skin tears/abrasions  Battery: ????? '[]' to power wheelchair ?????  Other: Rear Anti-Tippers Prevent tipping backwards ?????  The above equipment has a life- long use expectancy. Growth and changes in medical and/or functional conditions would be the exceptions. This is to certify that the therapist has no financial relationship with durable medical provider or manufacturer. The therapist will not receive remuneration of any kind for the equipment recommended in this evaluation.   Patient has mobility limitation that significantly impairs safe, timely participation in one or more mobility related ADL's.  (bathing, toileting, feeding, dressing, grooming, moving from room to room)                                                             '[x]'  Yes '[]'   No Will mobility device sufficiently improve ability to participate and/or be aided in participation of MRADL's?         '[x]'  Yes '[]'  No Can limitation be compensated for with use of a cane or walker?                                                                                '[]'  Yes '[x]'  No Does patient or caregiver demonstrate ability/potential ability & willingness to safely use the mobility device?   '[x]'  Yes '[]'  No Does patient's home environment support use of recommended mobility device?                                                    [  x] Yes '[]'  No Does patient have sufficient upper extremity function necessary to functionally propel a manual wheelchair?    '[x]'  Yes '[]'  No Does patient have sufficient strength and trunk stability to safely operate a POV (scooter)?                                  '[]'  Yes '[x]'  No Does patient need additional features/benefits provided by a power wheelchair for MRADL's in the home?       '[]'  Yes '[x]'  No Does the patient demonstrate the ability to safely use a power wheelchair?                                                              '[]'  Yes '[x]'  No  Therapist Name Printed: Rico Junker, PT, DPT Date: 08/05/18  Therapist's Signature:   Date:   Supplier's Name Printed: [employer] Date: 08/05/18  Supplier's Signature:   Date:  Patient/Caregiver Signature:   Date:     This is to certify that I have read this evaluation and do agree with the content within:      Physician's Name Printed: Wyline Copas, MD  14 Signature:  Date:     This is to certify that I, the above signed therapist have the following affiliations: '[]'  This DME provider '[]'  Manufacturer of recommended equipment '[]'  Patient's long term care facility '[x]'  None of the above            Objective measurements completed on examination: See above findings.              PT Education - 08/05/18 1336    Education Details  process for obtaining a new  wheelchair    Person(s) Educated  Patient;Parent(s)    Methods  Explanation    Comprehension  Verbalized understanding                  Plan - 08/05/18 1337    Clinical Impression Statement  Pt is a 27 year old female referred to Neuro [facility] for evaluation for new manual wheelchair.  Pt's PMH is significant for the following: congenital cerebral palsy with quadriplegia, generalized convulsive epilepsy, scoliosis of thoracolumbar region, mild intellectual disability, heart murmur, and dysphagia.  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 The following deficits were noted during pt's exam: UE and LE weakness and impaired active and passive ROM, impaired posture with significant scoliosis and pelvic obliquity that is fixed, impaired postural control and sitting balance.  Pt would benefit from a new manual wheelchair with custom molded seat back and cushion to accommodate spinal and pelvic deformities, to improve support for head control, breathing and digestion, and to promote independent mobility in the home and community.     History and Personal Factors relevant to plan of care:  Currently unable to use her manual wheelchair outside the home due to condition - having to use transport wheelchair.  Congenital cerebral palsy with quadriplegia, generalized convulsive epilepsy, scoliosis of thoracolumbar region, mild intellectual disability, heart murmur, and dysphagia.  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  Congenital  cerebral palsy with quadriplegia, generalized convulsive epilepsy, scoliosis of thoracolumbar region, mild intellectual disability, heart murmur, and dysphagia.  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, inguinal hernia repair and kidney surgery    Clinical Decision Making  High    PT Frequency  One time visit    PT Duration  Other (comment)   one visit for wheelchair evaluation only   PT Treatment/Interventions  Other (comment)   wheelchair evaluation   Consulted and Agree with Plan of Care  Patient;Family member/caregiver    Family Member Consulted  Mother       Patient will benefit from skilled therapeutic intervention in order to improve the following deficits and impairments:  Decreased balance, Decreased range of motion, Decreased strength, Impaired tone, Impaired UE functional use, Postural dysfunction  Visit Diagnosis: Spastic quadriplegic cerebral palsy (HCC)  Other symptoms and signs involving the nervous system  Abnormal posture  Muscle weakness (generalized)     Problem List Patient Active Problem List   Diagnosis Date Noted  . Need for immunization against influenza 05/07/2017  . Nutrition impaired due to limited access to healthful foods 05/07/2017  . Thrush 07/09/2016  . Constipation 07/09/2016  . Dysphagia 10/25/2015  . 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 Generalized convulsive epilepsy ([place]) 05/19/2013  . Partial epilepsy with impairment of consciousness ([place]) 05/19/2013  . Congenital quadriplegia ([place]) 05/19/2013  . Scoliosis associated with other condition 05/19/2013  . Unspecified disorder of eye movements 05/19/2013  . 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 Unspecified urinary incontinence 05/19/2013  . Unspecified congenital cataract 05/19/2013  . Cerebral palsy ([place]) 07/06/2011  . Seizure disorder ([place]) 07/06/2011   . Heart murmur 07/06/2011  . Healthcare maintenance 07/06/2011    Rico Junker, PT, DPT 08/05/18    1:46 PM    [facility] [address] [place], [place], [address] Phone: [phone]   Fax:  [phone]  Name: Kristin Fitzgerald MRN: [id number] Date of Birth: 05/26/1991

## 2018-08-12 ENCOUNTER — Ambulatory Visit: Payer: Medicaid Other | Admitting: Physical Therapy

## 2018-09-12 ENCOUNTER — Other Ambulatory Visit (INDEPENDENT_AMBULATORY_CARE_PROVIDER_SITE_OTHER): Payer: Self-pay | Admitting: Pediatrics

## 2018-09-12 DIAGNOSIS — G40309 Generalized idiopathic epilepsy and epileptic syndromes, not intractable, without status epilepticus: Secondary | ICD-10-CM

## 2018-09-12 DIAGNOSIS — G40209 Localization-related (focal) (partial) symptomatic epilepsy and epileptic syndromes with complex partial seizures, not intractable, without status epilepticus: Secondary | ICD-10-CM

## 2018-09-26 ENCOUNTER — Telehealth (INDEPENDENT_AMBULATORY_CARE_PROVIDER_SITE_OTHER): Payer: Self-pay | Admitting: Pediatrics

## 2018-09-26 NOTE — Telephone Encounter (Signed)
There is a face-to-face visit from March 07, 2018.  We asked for forms to be sent at that time and as best I know they were never sent.  I think that we have passed the time when the face-to-face would be acceptable.  We may need to see her again.  This could be done over a WebEx.  Please check with [employer] and find out if the September visit could be used.

## 2018-09-26 NOTE — Telephone Encounter (Signed)
°  Who's calling (name and relationship to patient) : Pam ([employer]) Best contact number: [phone] Provider they see: Dr. Sharene Skeans  Reason for call: Pam calling to check on the status of wheelchair request (LMN, CMN, and face to face) faxed to [place] on 3/6. She would like a call back regarding the status of the orders.

## 2018-09-26 NOTE — Telephone Encounter (Signed)
Spoke with Pam from [employer] and she is going to get back with me. With the patient having Medicaid, it has to be within 6 months. They will be back in contact with me

## 2018-10-16 ENCOUNTER — Telehealth (INDEPENDENT_AMBULATORY_CARE_PROVIDER_SITE_OTHER): Payer: Self-pay | Admitting: Pediatrics

## 2018-10-16 NOTE — Telephone Encounter (Signed)
I am willing to fill this out, but I think that is a bad idea.  Please place it in my box and I will take care of it.

## 2018-10-16 NOTE — Telephone Encounter (Signed)
°  Who's calling (name and relationship to patient) : Brooke with [employer]  Best contact number: [phone]  Provider they see: Sharene Skeans  Reason for call: Please see telephone encounter from 09/26/2018. Nehemiah Settle called today stating they are ok with submitting the forms for patient to receive the manual wheelchair. This is only if Dr. Sharene Skeans is ok with them doing this without having the face to face. The request from [employer] may get denied, but they are willing to try. If Dr. Sharene Skeans is ok with this, please complete the forms [employer] sent to [place] in March.     PRESCRIPTION REFILL ONLY  Name of prescription:  Pharmacy:

## 2018-10-16 NOTE — Telephone Encounter (Signed)
The forms have been given to Dr. Sharene Skeans

## 2018-11-13 ENCOUNTER — Other Ambulatory Visit (INDEPENDENT_AMBULATORY_CARE_PROVIDER_SITE_OTHER): Payer: Self-pay | Admitting: Pediatrics

## 2018-11-13 DIAGNOSIS — G40309 Generalized idiopathic epilepsy and epileptic syndromes, not intractable, without status epilepticus: Secondary | ICD-10-CM

## 2018-11-13 DIAGNOSIS — G40209 Localization-related (focal) (partial) symptomatic epilepsy and epileptic syndromes with complex partial seizures, not intractable, without status epilepticus: Secondary | ICD-10-CM

## 2018-12-18 ENCOUNTER — Other Ambulatory Visit (INDEPENDENT_AMBULATORY_CARE_PROVIDER_SITE_OTHER): Payer: Self-pay | Admitting: Pediatrics

## 2018-12-18 DIAGNOSIS — G40209 Localization-related (focal) (partial) symptomatic epilepsy and epileptic syndromes with complex partial seizures, not intractable, without status epilepticus: Secondary | ICD-10-CM

## 2018-12-18 DIAGNOSIS — G40309 Generalized idiopathic epilepsy and epileptic syndromes, not intractable, without status epilepticus: Secondary | ICD-10-CM

## 2018-12-19 ENCOUNTER — Other Ambulatory Visit (INDEPENDENT_AMBULATORY_CARE_PROVIDER_SITE_OTHER): Payer: Self-pay | Admitting: Pediatrics

## 2018-12-19 DIAGNOSIS — G40209 Localization-related (focal) (partial) symptomatic epilepsy and epileptic syndromes with complex partial seizures, not intractable, without status epilepticus: Secondary | ICD-10-CM

## 2018-12-19 DIAGNOSIS — G40309 Generalized idiopathic epilepsy and epileptic syndromes, not intractable, without status epilepticus: Secondary | ICD-10-CM

## 2019-01-16 ENCOUNTER — Other Ambulatory Visit (INDEPENDENT_AMBULATORY_CARE_PROVIDER_SITE_OTHER): Payer: Self-pay | Admitting: Pediatrics

## 2019-01-16 DIAGNOSIS — G40309 Generalized idiopathic epilepsy and epileptic syndromes, not intractable, without status epilepticus: Secondary | ICD-10-CM

## 2019-01-16 DIAGNOSIS — G40209 Localization-related (focal) (partial) symptomatic epilepsy and epileptic syndromes with complex partial seizures, not intractable, without status epilepticus: Secondary | ICD-10-CM

## 2019-02-18 ENCOUNTER — Other Ambulatory Visit (INDEPENDENT_AMBULATORY_CARE_PROVIDER_SITE_OTHER): Payer: Self-pay | Admitting: Pediatrics

## 2019-02-18 DIAGNOSIS — G40309 Generalized idiopathic epilepsy and epileptic syndromes, not intractable, without status epilepticus: Secondary | ICD-10-CM

## 2019-02-18 DIAGNOSIS — G40209 Localization-related (focal) (partial) symptomatic epilepsy and epileptic syndromes with complex partial seizures, not intractable, without status epilepticus: Secondary | ICD-10-CM

## 2019-03-05 ENCOUNTER — Other Ambulatory Visit: Payer: Self-pay

## 2019-03-05 ENCOUNTER — Ambulatory Visit (INDEPENDENT_AMBULATORY_CARE_PROVIDER_SITE_OTHER): Payer: Medicaid Other

## 2019-03-05 DIAGNOSIS — Z23 Encounter for immunization: Secondary | ICD-10-CM | POA: Diagnosis not present

## 2019-03-05 NOTE — Progress Notes (Signed)
Patient presents in nurse clinic for Flu vaccine. Vaccine given, LD. Patient tolerated well. 

## 2019-03-21 ENCOUNTER — Other Ambulatory Visit (INDEPENDENT_AMBULATORY_CARE_PROVIDER_SITE_OTHER): Payer: Self-pay | Admitting: Pediatrics

## 2019-03-21 DIAGNOSIS — G40309 Generalized idiopathic epilepsy and epileptic syndromes, not intractable, without status epilepticus: Secondary | ICD-10-CM

## 2019-03-21 DIAGNOSIS — G40209 Localization-related (focal) (partial) symptomatic epilepsy and epileptic syndromes with complex partial seizures, not intractable, without status epilepticus: Secondary | ICD-10-CM

## 2019-04-26 ENCOUNTER — Other Ambulatory Visit (INDEPENDENT_AMBULATORY_CARE_PROVIDER_SITE_OTHER): Payer: Self-pay | Admitting: Pediatrics

## 2019-04-26 DIAGNOSIS — G40309 Generalized idiopathic epilepsy and epileptic syndromes, not intractable, without status epilepticus: Secondary | ICD-10-CM

## 2019-04-26 DIAGNOSIS — G40209 Localization-related (focal) (partial) symptomatic epilepsy and epileptic syndromes with complex partial seizures, not intractable, without status epilepticus: Secondary | ICD-10-CM

## 2019-05-28 ENCOUNTER — Other Ambulatory Visit (INDEPENDENT_AMBULATORY_CARE_PROVIDER_SITE_OTHER): Payer: Self-pay | Admitting: Pediatrics

## 2019-05-28 DIAGNOSIS — G40209 Localization-related (focal) (partial) symptomatic epilepsy and epileptic syndromes with complex partial seizures, not intractable, without status epilepticus: Secondary | ICD-10-CM

## 2019-05-28 DIAGNOSIS — G40309 Generalized idiopathic epilepsy and epileptic syndromes, not intractable, without status epilepticus: Secondary | ICD-10-CM

## 2019-06-28 ENCOUNTER — Other Ambulatory Visit (INDEPENDENT_AMBULATORY_CARE_PROVIDER_SITE_OTHER): Payer: Self-pay | Admitting: Pediatrics

## 2019-06-28 DIAGNOSIS — G40209 Localization-related (focal) (partial) symptomatic epilepsy and epileptic syndromes with complex partial seizures, not intractable, without status epilepticus: Secondary | ICD-10-CM

## 2019-06-28 DIAGNOSIS — G40309 Generalized idiopathic epilepsy and epileptic syndromes, not intractable, without status epilepticus: Secondary | ICD-10-CM

## 2019-08-02 ENCOUNTER — Other Ambulatory Visit (INDEPENDENT_AMBULATORY_CARE_PROVIDER_SITE_OTHER): Payer: Self-pay | Admitting: Pediatrics

## 2019-08-02 DIAGNOSIS — G40309 Generalized idiopathic epilepsy and epileptic syndromes, not intractable, without status epilepticus: Secondary | ICD-10-CM

## 2019-08-02 DIAGNOSIS — G40209 Localization-related (focal) (partial) symptomatic epilepsy and epileptic syndromes with complex partial seizures, not intractable, without status epilepticus: Secondary | ICD-10-CM

## 2019-08-04 MED ORDER — CARBAMAZEPINE ER 200 MG PO CP12: 200.0000 mg | ORAL_CAPSULE | Freq: Two times a day (BID) | ORAL | 0 refills | Status: DC

## 2019-08-04 NOTE — Telephone Encounter (Signed)
Please fill at your discretion. Patient has not been seen since 02/2018

## 2019-08-04 NOTE — Addendum Note (Signed)
Addended by: Deetta Perla on: 08/04/2019 10:59 AM   Modules accepted: Orders

## 2019-08-04 NOTE — Telephone Encounter (Signed)
Please have Kristin Fitzgerald give this family a call.  We will cut off her medication if they do not keep an appointment.

## 2019-08-05 ENCOUNTER — Other Ambulatory Visit: Payer: Self-pay

## 2019-08-05 ENCOUNTER — Ambulatory Visit (INDEPENDENT_AMBULATORY_CARE_PROVIDER_SITE_OTHER): Payer: Medicaid Other | Admitting: Pediatrics

## 2019-08-05 ENCOUNTER — Encounter (INDEPENDENT_AMBULATORY_CARE_PROVIDER_SITE_OTHER): Payer: Self-pay | Admitting: Pediatrics

## 2019-08-05 VITALS — BP 100/70 | HR 72 | Wt 95.0 lb

## 2019-08-05 DIAGNOSIS — M4145 Neuromuscular scoliosis, thoracolumbar region: Secondary | ICD-10-CM

## 2019-08-05 DIAGNOSIS — G40309 Generalized idiopathic epilepsy and epileptic syndromes, not intractable, without status epilepticus: Secondary | ICD-10-CM

## 2019-08-05 DIAGNOSIS — F70 Mild intellectual disabilities: Secondary | ICD-10-CM

## 2019-08-05 DIAGNOSIS — G40209 Localization-related (focal) (partial) symptomatic epilepsy and epileptic syndromes with complex partial seizures, not intractable, without status epilepticus: Secondary | ICD-10-CM

## 2019-08-05 DIAGNOSIS — F4329 Adjustment disorder with other symptoms: Secondary | ICD-10-CM

## 2019-08-05 DIAGNOSIS — G808 Other cerebral palsy: Secondary | ICD-10-CM

## 2019-08-05 MED ORDER — GABAPENTIN 100 MG PO CAPS: ORAL_CAPSULE | ORAL | 5 refills | Status: DC

## 2019-08-05 MED ORDER — CARBATROL 200 MG PO CP12: ORAL_CAPSULE | ORAL | 5 refills | Status: DC

## 2019-08-05 NOTE — Patient Instructions (Signed)
Thank you for coming.  I am glad the seizures under control.  I will be happy to petition for you to regain the hours that you have lost.  Our fax number is [phone].  Please send the form to me.  I need to see your daughter at least once a year.  I will be happy to see her sooner as needed.  Prescriptions have been issued for both antiepileptic medications.  One of them had to be printed so that it does not become generic.

## 2019-08-05 NOTE — Progress Notes (Signed)
Patient: Kristin Fitzgerald MRN: [id number] Sex: female DOB: [birth year]  Provider: Ellison Carwin, MD Location of Care: [facility]  Note type: Routine return visit  History of Present Illness: Referral Source: [facility]/Kristin Fitzgerald History from: mother, patient and [facility] chart Chief Complaint: Seizures/Spastic Quadriparesis  Kristin Fitzgerald is a 29 y.o. female who returns 08/05/2019 for the first time since 03/07/2018.  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  Her seizures have been well controlled for years.  She takes and tolerates Carbatrol and gabapentin without side effects.  She is dependent on others to bathe and dress her, change her diaper, help feed her and prepare meals. Her CAPS hours have been dropped from 80 to 74 hours/month.  This is affected mother in the sense that she has limited resources to watch Kristin Fitzgerald when she is at work and has forced her to come home from work early on numerous occasions.  This is threatening her job.  She asked me to complete a form that would request return of her hours to 80.  This came in after she left I filled out and it will be returned.  She is demonstrating oppositional and aggressive behavior when she is angry and frustrated.  This seems to be most prominent when she is prevented from doing something that she wants to do, but it can happen for very trivial things such as "I do not want it to get dark" and is very disruptive.  She will holler cry throw things.  Mother and I discussed this outside of the exam room away from her because of it sensitivity.  In my opinion this is manipulative  behavior.  I do not think that there is a way that we can specifically treat it.  In general her health is good.  She is sleeping well.  She goes to bed between 10 and 11 PM although sometimes she will stay up all night.  She gets up in the morning between 11:30 AM and 12 noon.  The routine is similar on the weekends.  She sleeps in her own room.  She is living with her brother and sister-in-law in their home while mother is seeking another dwelling after having sold her home.  She has lost weight since her last visit but because of her weakness, I am not certain that we can accurately measure her weight.  Fortunately that despite the fact that her mother works outside the home no one has contracted Covid during the pandemic.  Review of Systems: A complete review of systems was remarkable for patient is here to be seen for seizures and spastic quadriplegia. Mom reports that the patient has not had any seizures since her last visit. She states that she has concerns but only discussed them with Dr. Sharene Fitzgerald. She is also concerned about her nurse hours being cut from 80 to 74. Mom is requesting aletter to help with this. No other concerns at this time., all other systems reviewed and negative.  Past Medical History Diagnosis Date  . Cerebral palsy (HCC)   . Mild intellectual disabilities   . Seizure disorder (HCC)   . Seizures (HCC)    Hospitalizations: No., Head Injury: No., Nervous System Infections: No., Immunizations up to date: Yes.    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  Birth History 3 lbs. 1 oz. Infant born at [redacted] weeks gestational age  Gestation was complicated by premature delivery for unknown reasons. Nursery Course was complicated by seizures a day 3 of life. The patient had no hemorrhage but had periventricular leukomalacia. Growth and Development was recalled as  abnormal  Behavior History Tantrums with explosive behavior  Surgical History Procedure Laterality Date  . CARDIAC SURGERY     Repaired valve in 2007-[place]  . EYE SURGERY       bilateral eye surgery in infancy  . HIP ARTHROPLASTY     for recurrent dislocation; rebuilt acetabulum  . INGUINAL HERNIA REPAIR    . 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 OTHER SURGICAL HISTORY     [place] [facility] as an infant   Family History family history includes Asthma in her brother; Breast cancer in her maternal aunt; Kidney failure in her maternal grandmother; Leukemia in her maternal grandfather. Family history is negative for migraines, seizures, intellectual disabilities, blindness, deafness, birth defects, chromosomal disorder, or autism.  Social History Socioeconomic History  . Marital status: Single  . Years of education:  56  . Highest education level:  High school certificate  Occupational History  . Not employed due to disability  Tobacco Use  . Smoking status: Passive Smoke Exposure - Never Smoker  . Smokeless tobacco: Never Used  . Tobacco comment: Mother and brother smoke   Substance and Sexual Activity  . Alcohol use: No    Alcohol/week: 0.0 standard drinks  . Drug use: No  . 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    She lives with her mother and her brother Kristin Fitzgerald.   Allergies Allergen Reactions  . Demerol Nausea And Vomiting   Physical Exam BP 100/70   Pulse 72   Wt 95 lb (43.1 kg)   General: alert, well developed, well nourished, in no acute distress, black hair, brown eyes, left handed Head: normocephalic, no dysmorphic features Ears, Nose and Throat: Otoscopic: tympanic membranes normal; pharynx: oropharynx is pink without exudates or tonsillar hypertrophy Neck: supple, full range of motion, no cranial or cervical bruits Respiratory: auscultation clear Cardiovascular:  no murmurs, pulses are normal Musculoskeletal: flexion contractures at the right elbow, both knees, tight heel cords, severe convex left neuromuscular scoliosis, right hemiatrophy involving the arm greater than the leg Skin: no rashes or neurocutaneous lesions  Neurologic Exam  Mental Status: alert; oriented to person; knowledge is below normal for age; language is Fitzgerald normal, but she is able to name objects and follow commands she has significant dysarthria which is usually intelligible. Cranial Nerves: visual fields are full to double simultaneous stimuli; extraocular movements are full and conjugate; pupils are round reactive to light; funduscopic examination shows positive red reflex, it was difficult to see her fundi because of photophobia; symmetric facial strength; midline tongue and uvula; she localizes sound bilaterally Motor: spastic quadriparesis with sparing of her left arm, dystonic posturing of the fingers of the right with clumsiness in both hands; she is unable to bring her thumb past her index finger bilaterally; 4/5 strength in the right arm, 4+/5 on the left, 1-2 in the right leg, 3/5 on the left; she is not able to spontaneously move either foot; she has significant limitation  of range of motion of her right hip Sensory: intact responses to cold, vibration, proprioception and stereognosis Coordination: good finger-to-nose, rapid repetitive alternating movements and finger apposition Gait and Station: normal gait and station: patient is able to walk on heels, toes and tandem without difficulty; balance is adequate; Romberg exam is negative; Gower response is negative Reflexes: symmetric and diminished bilaterally; no clonus; bilateral flexor plantar responses  Assessment 1.  Focal epilepsy with impairment of consciousness, G40.109. 2.  Generalized convulsive epilepsy, G40.309. 3.  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 4.  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 5.   Mild intellectual disability, F70. 6.  Adjustment disorder with emotional disturbance, F43.29.  Discussion I am pleased that her seizures remain under control.  I am not surprised by her outbursts and her manipulative behavior.  I think that is very difficult because she wants to be viewed like other young adults her age, but her multiple disabilities fact that.  She is totally dependent on others for her care.  It is medically necessary for her to have 80 hours/month of CAPS support so that her mother can work.  Plan I filled out a form supporting that.  I will be happy to place that in a formal letter if necessary.  I refilled her prescriptions for gabapentin and Carbatrol.  Greater than 50% of a 25-minute visit was spent in counseling and coordination of care concerning her seizures, her behavior, and suggesting ways to deal with her outbursts.  In my opinion, when she has them, she needs to go to her room and "get over it".  She cannot win these power struggles.  I requested that her mother bring her back in a year I will be happy to see her sooner based on clinical need.   Medication List   Accurate as of August 05, 2019 11:59 PM. If you have any questions, ask your nurse or doctor.      TAKE these medications   Carbatrol 200 MG 12 hr capsule Generic drug: carbamazepine Take 1 capsule twice daily What changed: You were already taking a medication with the same name, and this prescription was added. Make sure you understand how and when to take each. Changed by: Ellison Carwin, MD   cetirizine HCl 5 MG/5ML Syrp Commonly known as: Zyrtec Take 10 mLs (10 mg total) by mouth daily.   gabapentin 100 MG capsule Commonly known as: NEURONTIN Take 1 capsule in the morning and 2 capsules at bedtime    The medication list was reviewed and reconciled. All changes or newly prescribed medications were explained.  A complete medication list was provided to the patient/caregiver.  Deetta Perla MD

## 2019-08-12 ENCOUNTER — Encounter (INDEPENDENT_AMBULATORY_CARE_PROVIDER_SITE_OTHER): Payer: Self-pay | Admitting: Pediatrics

## 2019-09-19 ENCOUNTER — Telehealth (INDEPENDENT_AMBULATORY_CARE_PROVIDER_SITE_OTHER): Payer: Self-pay | Admitting: Pediatrics

## 2019-09-19 DIAGNOSIS — M4145 Neuromuscular scoliosis, thoracolumbar region: Secondary | ICD-10-CM

## 2019-09-19 DIAGNOSIS — F70 Mild intellectual disabilities: Secondary | ICD-10-CM

## 2019-09-19 DIAGNOSIS — G40309 Generalized idiopathic epilepsy and epileptic syndromes, not intractable, without status epilepticus: Secondary | ICD-10-CM

## 2019-09-19 DIAGNOSIS — G808 Other cerebral palsy: Secondary | ICD-10-CM

## 2019-09-19 NOTE — Telephone Encounter (Signed)
I called Mom and left a message that Dr Sharene Skeans is out of the office until April 5th but that I will work on getting a new letter and an order for the shower chair. TG

## 2019-09-19 NOTE — Telephone Encounter (Signed)
  Who's calling (name and relationship to patient) :mom / [name]   Best contact number:[phone]  Provider they see:Dr. Sharene Skeans   Reason for call:mom stated that the patient needs a new form filled out so she can continue to have a aid. The form that was filled out last did not have her last date that she was seen and it got denied. Mom also stated that the patient needs a new shower chair because hers is falling apart.      PRESCRIPTION REFILL ONLY  Name of prescription:  Pharmacy:

## 2019-09-25 NOTE — Telephone Encounter (Signed)
I called and talked to Mom. I told her that I would revise the letter as requested and have it for Dr Sharene Skeans to sign when he returns. I will also refer her for physical therapy evaluation for equipment needs and send to [employer] She agreed with this plan. TG

## 2019-09-26 NOTE — Telephone Encounter (Signed)
Thank you :)

## 2019-09-29 ENCOUNTER — Other Ambulatory Visit (INDEPENDENT_AMBULATORY_CARE_PROVIDER_SITE_OTHER): Payer: Self-pay | Admitting: Pediatrics

## 2019-09-29 DIAGNOSIS — G40209 Localization-related (focal) (partial) symptomatic epilepsy and epileptic syndromes with complex partial seizures, not intractable, without status epilepticus: Secondary | ICD-10-CM

## 2019-09-29 DIAGNOSIS — G40309 Generalized idiopathic epilepsy and epileptic syndromes, not intractable, without status epilepticus: Secondary | ICD-10-CM

## 2019-11-05 ENCOUNTER — Ambulatory Visit: Payer: Medicaid Other | Admitting: Physical Therapy

## 2019-12-04 ENCOUNTER — Ambulatory Visit: Payer: Medicaid Other | Attending: Family | Admitting: Physical Therapy

## 2019-12-04 ENCOUNTER — Other Ambulatory Visit: Payer: Self-pay

## 2019-12-04 DIAGNOSIS — R293 Abnormal posture: Secondary | ICD-10-CM | POA: Insufficient documentation

## 2019-12-04 DIAGNOSIS — G800 Spastic quadriplegic cerebral palsy: Secondary | ICD-10-CM | POA: Diagnosis not present

## 2019-12-04 DIAGNOSIS — M6281 Muscle weakness (generalized): Secondary | ICD-10-CM

## 2019-12-04 DIAGNOSIS — R29818 Other symptoms and signs involving the nervous system: Secondary | ICD-10-CM | POA: Insufficient documentation

## 2019-12-04 NOTE — Therapy (Signed)
[facility] [address] [place], [place], [address] Phone: [phone]   Fax:  [phone]  Physical Therapy Evaluation  Patient Details  Name: Kristin Fitzgerald MRN: [id number] Date of Birth: Sep 04, 1990 Referring Provider (PT): Elveria Rising, NP   Encounter Date: 12/04/2019   PT End of Session - 12/04/19 1014    Visit Number 1    Number of Visits 1    Date for PT Re-Evaluation 12/04/19    Authorization Type Medicaid    PT Start Time 0935    PT Stop Time 1002    PT Time Calculation (min) 27 min    Activity Tolerance Patient tolerated treatment well    Behavior During Therapy [facility] for tasks assessed/performed           Past Medical History:  Diagnosis Date  . Cerebral palsy (HCC)   . Mild intellectual disabilities   . Seizure disorder (HCC)   . Seizures (HCC)     Past Surgical History:  Procedure Laterality Date  . CARDIAC SURGERY     Repaired valve in 2007-[place]  . EYE SURGERY       bilateral eye surgery in infancy  . HIP ARTHROPLASTY     for recurrent dislocation; rebuilt acetabulum  . INGUINAL HERNIA REPAIR    . 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 OTHER SURGICAL HISTORY     [place] [facility] as an infant    There were no vitals filed for this visit.    Subjective Assessment - 12/04/19 0937    Subjective Pt arrives with mother in transport wheelchair.  Manual wheelchair is working well but it is heavy and hard to lift so arrived in transport wheelchair. Being evaluated for a new shower chair.  Previous shower chair was >7 years old and in poor condition.  Not currently using a shower chair - mother has to hold patient up for support and perform all bathing tasks currently.  When pt was using a shower chair and was submerged in the water she was able to participate and help with some bathing tasks.  Pt is dependent for transfers.  Mother's OA is  beginning to affect her ability to transfer pt down in and out of tub safely.    Patient is accompained by: Family member    Pertinent History congenital cerebral palsy with quadriplegia, generalized convulsive epilepsy, scoliosis of thoracolumbar region, mild intellectual disability, heart murmur, and dysphagia.  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, inguinal hernia repair and kidney surgery    Patient Stated Goals to obtain a new shower chair    Currently in Pain? No/denies              [facility] PT Assessment - 12/04/19 0940      Assessment   Medical Diagnosis Congenital cerebral palsy with quadriplegia    Referring Provider (PT) Elveria Rising, NP    Onset Date/Surgical Date 09/25/19    Prior Therapy yes when she was in school      Precautions   Precautions --      Home Environment   Living Environment Private residence    Living Arrangements Parent    Additional Comments Has a standard tub shower, pt must be submerged in water for bathing.  Does not currently have a shower chair      Prior Function  Level of Independence Needs assistance with transfers;Needs assistance with ADLs      Observation/Other Assessments   Focus on Therapeutic Outcomes (FOTO)  Not assessed      Posture/Postural Control   Posture/Postural Control Postural limitations    Posture Comments Seated in transport chair pt demonstrates R lateral lean, C curve of trunk with convex to L, L pelvis elevated, head tilted and rotated to R, posterior pelvic tilt.  Pt is the only one to use that shower.                        Objective measurements completed on examination: See above findings.               PT Education - 12/04/19 1014    Education Details other options for shower chairs, process for obtaining a new shower chair    Person(s) Educated Patient;Parent(s)     Methods Explanation    Comprehension Verbalized understanding                       Plan - 12/04/19 0951    Clinical Impression Statement Pt is a 29 year old female referred to Neuro OPPT for evaluation for new Shower Chair. Pt's PMH is significant for the following: congenital cerebral palsy with quadriplegia, generalized convulsive epilepsy, scoliosis of thoracolumbar region, mild intellectual disability, heart murmur, and dysphagia. 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 The following deficits were noted during pt's exam: UE and LE weakness and impaired active and passive ROM, impaired posture with significant scoliosis and pelvic obliquity that is fixed, impaired postural control and sitting balance.   Patient and mother use a standard tub shower and patient must be partially submerged in water for adequate bathing.  Pt would benefit from an [employer] with electronic lift to assist mother with lifting and lowering patient into and out of tub due to patient's weight and mother's arthritis.  Without bath chair pt is unable to sit upright in the bath tub and mother is having to hold patient up while also performing bathing tasks.  When pt is supported in bath chair pt is able to participate and assist in bathing tasks.  A chest and pelvic belt are required to maintain upright sitting posture and trunk position in chair when performing bathing tasks.  Abduction block is required to prevent sliding forwards in seat due to posterior pelvic tilt and sacral sitting.  Suction cups are required to prevent the chair from sliding and moving on the smooth surface of the tub when bathing or transferring.  The Firefly Splashy and [place] Blue Wave were also considered but these were ruled out due to patient needing to be submerged in the water  and mother's inability to safely lift and lower patient repeatedly into and out of standard tub.  Pt requires use of lift to decrease risk of injury for patient and mother.    Personal Factors and Comorbidities Comorbidity 3+;Finances;Social Background    Comorbidities Congenital cerebral palsy with quadriplegia, generalized convulsive epilepsy, scoliosis of thoracolumbar region, mild intellectual disability, heart murmur, and dysphagia. 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, inguinal hernia repair and kidney surgery    Examination-Activity Limitations Bathing;Transfers    Stability/Clinical Decision Making Evolving/Moderate complexity  Clinical Decision Making Moderate    PT Frequency One time visit    PT Duration --   one time visit for shower chair evaluation   Consulted and Agree with Plan of Care Patient;Family member/caregiver    Family Member Consulted Mother           Patient will benefit from skilled therapeutic intervention in order to improve the following deficits and impairments:  Decreased balance, Decreased range of motion, Decreased strength, Impaired tone, Postural dysfunction  Visit Diagnosis: Spastic quadriplegic cerebral palsy (HCC)  Other symptoms and signs involving the nervous system  Abnormal posture  Muscle weakness (generalized)     Problem List Patient Active Problem List   Diagnosis Date Noted  . Adjustment disorder with emotional disturbance 08/05/2019  . Need for immunization against influenza 05/07/2017  . Nutrition impaired due to limited access to healthful foods 05/07/2017  . Thrush 07/09/2016  . Constipation 07/09/2016  . Dysphagia 10/25/2015  . 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 Generalized convulsive epilepsy ([place]) 05/19/2013  . Partial epilepsy with impairment of consciousness ([place]) 05/19/2013  .  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 Scoliosis associated with other condition 05/19/2013  . Unspecified disorder of eye movements 05/19/2013  . 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 Unspecified urinary incontinence 05/19/2013  . Unspecified congenital cataract 05/19/2013  . Cerebral palsy ([place]) 07/06/2011  . Seizure disorder ([place]) 07/06/2011  . Heart murmur 07/06/2011  . Healthcare maintenance 07/06/2011    Rico Junker 12/04/2019, 10:17 AM  [facility] [address] [place], [place], [address] Phone: [phone]   Fax:  [phone]  Name: Kristin Fitzgerald MRN: [id number] Date of Birth: 09-02-1990

## 2020-03-02 ENCOUNTER — Other Ambulatory Visit (INDEPENDENT_AMBULATORY_CARE_PROVIDER_SITE_OTHER): Payer: Self-pay | Admitting: Pediatrics

## 2020-03-02 DIAGNOSIS — G40209 Localization-related (focal) (partial) symptomatic epilepsy and epileptic syndromes with complex partial seizures, not intractable, without status epilepticus: Secondary | ICD-10-CM

## 2020-03-02 DIAGNOSIS — G40309 Generalized idiopathic epilepsy and epileptic syndromes, not intractable, without status epilepticus: Secondary | ICD-10-CM

## 2020-04-07 ENCOUNTER — Other Ambulatory Visit (INDEPENDENT_AMBULATORY_CARE_PROVIDER_SITE_OTHER): Payer: Self-pay | Admitting: Pediatrics

## 2020-04-07 ENCOUNTER — Telehealth (INDEPENDENT_AMBULATORY_CARE_PROVIDER_SITE_OTHER): Payer: Self-pay | Admitting: Pediatrics

## 2020-04-07 DIAGNOSIS — G40309 Generalized idiopathic epilepsy and epileptic syndromes, not intractable, without status epilepticus: Secondary | ICD-10-CM

## 2020-04-07 DIAGNOSIS — G40209 Localization-related (focal) (partial) symptomatic epilepsy and epileptic syndromes with complex partial seizures, not intractable, without status epilepticus: Secondary | ICD-10-CM

## 2020-04-07 MED ORDER — CARBAMAZEPINE ER 200 MG PO CP12: 200.0000 mg | ORAL_CAPSULE | Freq: Two times a day (BID) | ORAL | 5 refills | Status: DC

## 2020-04-07 NOTE — Telephone Encounter (Signed)
  Who's calling (name and relationship to patient) : Misty Stanley (mom)  Best contact number: [phone]  Provider they see: Dr. Sharene Skeans  Reason for call: Mom states that pharmacy has contacted [place] for refill - patient is out of medication.    PRESCRIPTION REFILL ONLY  Name of prescription: carbamazepine (CARBATROL) 200 MG 12 hr capsule  Pharmacy:  [id number] - [place], [place] - [address]

## 2020-04-07 NOTE — Telephone Encounter (Signed)
Prescription was sent electronically. 

## 2020-05-12 ENCOUNTER — Other Ambulatory Visit: Payer: Self-pay

## 2020-05-12 ENCOUNTER — Ambulatory Visit (INDEPENDENT_AMBULATORY_CARE_PROVIDER_SITE_OTHER): Payer: Medicaid Other

## 2020-05-12 DIAGNOSIS — Z23 Encounter for immunization: Secondary | ICD-10-CM

## 2020-05-12 NOTE — Progress Notes (Signed)
Flu Vaccine administered LD without complication.  

## 2020-05-13 ENCOUNTER — Encounter (HOSPITAL_COMMUNITY): Payer: Self-pay | Admitting: Emergency Medicine

## 2020-05-13 ENCOUNTER — Emergency Department (HOSPITAL_COMMUNITY)
Admission: EM | Admit: 2020-05-13 | Discharge: 2020-05-14 | Disposition: A | Discharge status: Home / Self Care | Payer: Medicaid Other | Attending: Emergency Medicine | Admitting: Emergency Medicine

## 2020-05-13 ENCOUNTER — Other Ambulatory Visit: Payer: Self-pay

## 2020-05-13 ENCOUNTER — Emergency Department (HOSPITAL_COMMUNITY): Payer: Medicaid Other

## 2020-05-13 DIAGNOSIS — Z7722 Contact with and (suspected) exposure to environmental tobacco smoke (acute) (chronic): Secondary | ICD-10-CM | POA: Diagnosis not present

## 2020-05-13 DIAGNOSIS — R509 Fever, unspecified: Secondary | ICD-10-CM | POA: Diagnosis present

## 2020-05-13 DIAGNOSIS — U071 COVID-19: Secondary | ICD-10-CM

## 2020-05-13 LAB — CBC
HCT: 36.5 % (ref 36.0–46.0)
Hemoglobin: 11.2 g/dL — ABNORMAL LOW (ref 12.0–15.0)
MCH: 26.4 pg (ref 26.0–34.0)
MCHC: 30.7 g/dL (ref 30.0–36.0)
MCV: 85.9 fL (ref 80.0–100.0)
Platelets: 183 10*3/uL (ref 150–400)
RBC: 4.25 MIL/uL (ref 3.87–5.11)
RDW: 13.7 % (ref 11.5–15.5)
WBC: 2.3 10*3/uL — ABNORMAL LOW (ref 4.0–10.5)
nRBC: 0 % (ref 0.0–0.2)

## 2020-05-13 LAB — BASIC METABOLIC PANEL
Anion gap: 12 (ref 5–15)
BUN: 7 mg/dL (ref 6–20)
CO2: 24 mmol/L (ref 22–32)
Calcium: 9.1 mg/dL (ref 8.9–10.3)
Chloride: 100 mmol/L (ref 98–111)
Creatinine, Ser: 0.61 mg/dL (ref 0.44–1.00)
GFR, Estimated: >60 mL/min (ref >60)
Glucose, Bld: 90 mg/dL (ref 70–99)
Potassium: 3.8 mmol/L (ref 3.5–5.1)
Sodium: 136 mmol/L (ref 135–145)

## 2020-05-13 MED ORDER — ACETAMINOPHEN 160 MG/5ML PO SOLN
500.0000 mg | Freq: Once | ORAL | Status: AC
  Administered 2020-05-13: 500 mg via ORAL
  Filled 2020-05-13: qty 20.3

## 2020-05-13 NOTE — ED Triage Notes (Signed)
Pt had flu shot yesterday and has had cough since early this AM. Once her mother got home from work, pt was coughing worse. Pt unable to swallow pills, must have liquid tylenol. Endorses CP when coughing.

## 2020-05-13 NOTE — ED Provider Notes (Signed)
TIME SEEN: 11:22 PM  CHIEF COMPLAINT: Fever, cough  HPI: Patient is a 29 year old female with history of cerebral palsy, seizure disorder who presents to the emergency department with her caregiver for concerns of fever and nonproductive cough that started today. She did receive a flu shot yesterday. Has not had any COVID-19 vaccinations. No vomiting or diarrhea. Caregiver has noticed a vaginal discharge that makes her concern for possible urinary tract infection.  Patient wheelchair-bound.  ROS: Level 5 caveat due to intellectual disability  PAST MEDICAL HISTORY/PAST SURGICAL HISTORY:  Past Medical History:  Diagnosis Date  . Cerebral palsy (HCC)   . Mild intellectual disabilities   . Seizure disorder (HCC)   . Seizures (HCC)     MEDICATIONS:  Prior to Admission medications   Medication Sig Start Date End Date Taking? Authorizing Provider  carbamazepine (CARBATROL) 200 MG 12 hr capsule Take 1 capsule (200 mg total) by mouth 2 (two) times daily. 04/07/20   Deetta Perla, MD  cetirizine HCl (ZYRTEC) 5 MG/5ML SYRP Take 10 mLs (10 mg total) by mouth daily. 11/01/15   Bacigalupo, Marzella Schlein, MD  gabapentin (NEURONTIN) 100 MG capsule TAKE 1 CAPSULE IN THE MORNING AND 2 CAPSULES AT BEDTIME 03/02/20   Deetta Perla, MD    ALLERGIES:  Allergies  Allergen Reactions  . Demerol Nausea And Vomiting    SOCIAL HISTORY:  Social History   Tobacco Use  . Smoking status: Passive Smoke Exposure - Never Smoker  . Smokeless tobacco: Never Used  . Tobacco comment: Mother and brother smoke   Substance Use Topics  . Alcohol use: No    Alcohol/week: 0.0 standard drinks    FAMILY HISTORY: Family History  Problem Relation Age of Onset  . Asthma Brother   . Kidney failure Maternal Grandmother        Died at 54  . Leukemia Maternal Grandfather        Died at 21  . Breast cancer Maternal Aunt        Dx in 2010, In remission    EXAM: BP 133/90   Pulse 95   Temp (!) 101.1 F (38.4 C)  (Oral)   Resp 20   Wt 43.1 kg   SpO2 96%  CONSTITUTIONAL: Alert but does not answer questions or follow commands. Chronically ill-appearing. Appears small for age. HEAD: Normocephalic EYES: Conjunctivae clear, pupils appear equal, EOM appear intact ENT: normal nose; moist mucous membranes NECK: Supple, normal ROM CARD: RRR; S1 and S2 appreciated; no murmurs, no clicks, no rubs, no gallops RESP: Normal chest excursion without splinting or tachypnea; breath sounds clear and equal bilaterally; no wheezes, no rhonchi, no rales, no hypoxia or respiratory distress, speaking full sentences ABD/GI: Normal bowel sounds; non-distended; soft, non-tender, no rebound, no guarding, no peritoneal signs, no hepatosplenomegaly BACK:  The back appears normal EXT: no deformity noted, no edema; no cyanosis SKIN: Normal color for age and race; warm; no rash on exposed skin NEURO: Contracted extremities   MEDICAL DECISION MAKING: Patient here with fever, cough. Temperature 1-1.1 here and tachycardic. Now that fever has improved, so has patient's heart rate. She does have a mild leukopenia. Suspect viral illness and could possibly be Covid given she is unvaccinated. Will check viral panel as well as urinalysis, urine culture, blood cultures. At this time she does not appear septic and I do not feel she needs IV fluids and broad-spectrum antibiotics we will continue to closely monitor.  ED PROGRESS: Patient is Covid positive.  Urine  does not appear infected.  Lactate is normal.  Discussed options of monoclonal antibody treatment given patient has history of cerebral palsy.  Mother would prefer this be done in the emergency department rather than outpatient.  Discussed risk and benefits.  Mother agrees to treatment.  Infusion ordered in the ED.  Anticipate discharge home after observation.   No respiratory distress.  No significant increased work of breathing.  No hypoxia noted.  No Covid pneumonia on chest  x-ray.    At this time, I do not feel there is any life-threatening condition present. I have reviewed, interpreted and discussed all results (EKG, imaging, lab, urine as appropriate) and exam findings with patient/family. I have reviewed nursing notes and appropriate previous records.  I feel the patient is safe to be discharged home without further emergent workup and can continue workup as an outpatient as needed. Discussed usual and customary return precautions. Patient/family verbalize understanding and are comfortable with this plan.  Outpatient follow-up has been provided as needed. All questions have been answered.     Kristin Fitzgerald was evaluated in Emergency Department on 05/13/2020 for the symptoms described in the history of present illness. She was evaluated in the context of the global COVID-19 pandemic, which necessitated consideration that the patient might be at risk for infection with the SARS-CoV-2 virus that causes COVID-19. Institutional protocols and algorithms that pertain to the evaluation of patients at risk for COVID-19 are in a state of rapid change based on information released by regulatory bodies including the CDC and federal and state organizations. These policies and algorithms were followed during the patient's care in the ED.      Kristin Fitzgerald, Layla Maw, DO 05/14/20 [phone]

## 2020-05-14 LAB — URINALYSIS, MICROSCOPIC (REFLEX)
Bacteria, UA: NONE SEEN
RBC / HPF: NONE SEEN RBC/hpf (ref 0–5)
WBC, UA: NONE SEEN WBC/hpf (ref 0–5)

## 2020-05-14 LAB — URINALYSIS, ROUTINE W REFLEX MICROSCOPIC
Bilirubin Urine: NEGATIVE
Glucose, UA: NEGATIVE mg/dL
Hgb urine dipstick: NEGATIVE
Ketones, ur: NEGATIVE mg/dL
Leukocytes,Ua: NEGATIVE
Nitrite: NEGATIVE
Protein, ur: 30 mg/dL — AB
Specific Gravity, Urine: 1.017 (ref 1.005–1.030)
pH: 6 (ref 5.0–8.0)

## 2020-05-14 LAB — URINE CULTURE: Culture: NO GROWTH

## 2020-05-14 LAB — RESPIRATORY PANEL BY RT PCR (FLU A&B, COVID)
Influenza A by PCR: NEGATIVE
Influenza B by PCR: NEGATIVE
SARS Coronavirus 2 by RT PCR: POSITIVE — AB

## 2020-05-14 LAB — LACTIC ACID, PLASMA: Lactic Acid, Venous: 1.1 mmol/L (ref 0.5–1.9)

## 2020-05-14 MED ORDER — SODIUM CHLORIDE 0.9 % IV SOLN
1200.0000 mg | Freq: Once | INTRAVENOUS | Status: AC
  Administered 2020-05-14: 1200 mg via INTRAVENOUS
  Filled 2020-05-14: qty 10

## 2020-05-14 MED ORDER — EPINEPHRINE 0.3 MG/0.3ML IJ SOAJ: 0.3000 mg | Freq: Once | INTRAMUSCULAR | Status: DC | PRN

## 2020-05-14 MED ORDER — SODIUM CHLORIDE 0.9 % IV SOLN: INTRAVENOUS | Status: DC | PRN

## 2020-05-14 MED ORDER — METHYLPREDNISOLONE SODIUM SUCC 125 MG IJ SOLR: 125.0000 mg | Freq: Once | INTRAMUSCULAR | Status: DC | PRN

## 2020-05-14 MED ORDER — SODIUM CHLORIDE 0.9 % IV SOLN: 1200.0000 mg | Freq: Once | INTRAVENOUS | Status: DC

## 2020-05-14 MED ORDER — FAMOTIDINE IN NACL 20-0.9 MG/50ML-% IV SOLN: 20.0000 mg | Freq: Once | INTRAVENOUS | Status: DC | PRN

## 2020-05-14 MED ORDER — IBUPROFEN 100 MG/5ML PO SUSP
600.0000 mg | Freq: Once | ORAL | Status: AC
  Administered 2020-05-14: 600 mg via ORAL
  Filled 2020-05-14: qty 30

## 2020-05-14 MED ORDER — ALBUTEROL SULFATE HFA 108 (90 BASE) MCG/ACT IN AERS: 2.0000 | INHALATION_SPRAY | Freq: Once | RESPIRATORY_TRACT | Status: DC | PRN

## 2020-05-14 MED ORDER — DIPHENHYDRAMINE HCL 50 MG/ML IJ SOLN: 50.0000 mg | Freq: Once | INTRAMUSCULAR | Status: DC | PRN

## 2020-05-14 NOTE — Discharge Instructions (Addendum)
Please quarantine for 10 days after the onset of symptoms.  You will need to be fever free without using Tylenol or Ibuprofen for 3 full days AND your symptoms will need to be significantly improving or resolved (other than the loss of taste and smell which can last up to 3 months) before you can come out of quarantine.  You should isolate from others that you live with as well.  Any close contacts that have seen you in the past 14 days before your symptoms have started will need to be notified and they will need to quarantine for 14 full days even if they have a negative COVID test.  COVID-19 is a viral illness.  You do not need antibiotics.  You have received monoclonal antibodies here in the emergency department.  If you develop chest pain, difficulty breathing, have blue lips or fingertips, begin vomiting and can not stop and can not hold down fluids, feel like you may pass out or you do pass out, have confusion, please return to the emergency department.

## 2020-05-18 ENCOUNTER — Ambulatory Visit: Payer: Medicaid Other

## 2020-05-19 LAB — CULTURE, BLOOD (ROUTINE X 2)
Culture: NO GROWTH
Culture: NO GROWTH
Special Requests: ADEQUATE

## 2020-07-19 ENCOUNTER — Telehealth: Payer: Self-pay

## 2020-07-19 NOTE — Telephone Encounter (Signed)
Mother calls nurse line requesting recommendations from PCP on OTC meds for congestion. Mother reports she has a lot of health issues and would like specifics before she makes a purchase. Mother reports she had Covid in November. The only symptom is congestion. Mother denies fever, SOB, or cough. Apt scheduled with PCP in February for first covid vaccine. Please advise on OTC recommendations.

## 2020-07-19 NOTE — Telephone Encounter (Signed)
What You Can Do to Feel Better When You Have a Viral Illness Common Symptoms: runny eyes, muscle aches, throat irritation, ear pain, cough, sneezing, runny nose or congested nose, sinus pressure, headache, fever, fatigue   For your cough, try these:  Teaspoon of honey either alone or mixed with warm water  Tessalon pearls  Lozenges or hard candies  Laying with head elevated  Vicks/menthol rub topically on chest   For your congestion, try these:   Steroid nasal spray such as fluticasone or budesonide- helps prevent swelling in the nasal passage  Guaifenesin (mucinex) - helps thin the mucus  Steam- in a closed bathroom with hot shower running or a bedside humidifier  Drinking plenty of fluids to stay hydrated can help thin mucus   For your runny nose:   Atrovent nasal spray- helps decrease the drainage (can lead to dryness if overdone)  Nasal rinses such as a netty pot or a bulb syringe using filtered water mixed with small amount of baking soda and/or sea salt   For your fever:   Acetaminophen (Tylenol) up to 4g per day for most people  Ibuprofen 600mg up to three times per day for most people   For your sore throat:  Drinking either warm or cold liquids (whichever feels best to you)  Gargling warm salt water     Help prevent spreading of infection to others.  Wash your hands frequently  Avoid crowded places  Wear a mask when in public  Get your regularly scheduled vaccinations as they are recommended by the CDC.   Fun facts: -Antibiotics treat bacteria and have no effect on viruses so are not helpful in the vast majority of upper respiratory illnesses which are caused by common cold or flu viruses.  -Generic over the counter (OTC) medications have the same active ingredients and effectiveness of the more expensive name-brand version. -Vaccines are available to prevent infection with several of the most infectious/deadly viruses.  

## 2020-08-04 ENCOUNTER — Ambulatory Visit (INDEPENDENT_AMBULATORY_CARE_PROVIDER_SITE_OTHER): Payer: Medicaid Other | Admitting: Student in an Organized Health Care Education/Training Program

## 2020-08-04 ENCOUNTER — Encounter: Payer: Self-pay | Admitting: Student in an Organized Health Care Education/Training Program

## 2020-08-04 ENCOUNTER — Other Ambulatory Visit: Payer: Self-pay

## 2020-08-04 DIAGNOSIS — Z289 Immunization not carried out for unspecified reason: Secondary | ICD-10-CM

## 2020-08-04 NOTE — Progress Notes (Signed)
   SUBJECTIVE:   CHIEF COMPLAINT / HPI: COVID vaccine counseling  Mother brings Kristin Fitzgerald in today for Covid vaccine counseling. She wants Kristin Fitzgerald to get the vaccine to prevent infection but has concerns about the dose of the vaccine and the side effects. Since Kristin Fitzgerald is 95 pounds she wants to know if she should still get the adult dose or the pediatric dose because she thinks that the pediatric dose will have less side effects. Mother is also concerned that Kristin Fitzgerald will get sick with the vaccine but knows that it would be better than actually getting sick with Covid.  Check he has had Covid in the past.  OBJECTIVE:   BP 110/60   Pulse 70   LMP 07/15/2020   SpO2 99%   General: NAD, pleasant Extremities: no edema. WWP. Skin: warm and dry, no rashes noted Neuro: alert and responds to questions appropriately with simple answers  ASSESSMENT/PLAN:   COVID-19 vaccine dose not administered Mother agreeable to starting the Covid vaccine series but would like to delay the initial vaccine until Friday so that if Kristin Fitzgerald has any side effects and feels ill, mother will be able to stay home over the weekend with her and not miss any work. -Recommended she make a nurse visit for Friday afternoon to get the vaccine -If she experiences ill symptoms after the vaccine or seems uncomfortable, she can use Tylenol     Leeroy Bock, DO [facility]

## 2020-08-04 NOTE — Assessment & Plan Note (Signed)
Mother agreeable to starting the Covid vaccine series but would like to delay the initial vaccine until Friday so that if Kristin Fitzgerald has any side effects and feels ill, mother will be able to stay home over the weekend with her and not miss any work. -Recommended she make a nurse visit for Friday afternoon to get the vaccine -If she experiences ill symptoms after the vaccine or seems uncomfortable, she can use Tylenol

## 2020-08-04 NOTE — Patient Instructions (Signed)
It was a pleasure to see you today!  To summarize our discussion for this visit:  I'm happy to see that Kristin Fitzgerald is getting her COVID vaccine. It is understandable that you want to plan on possible side effects of not feeling well so we are scheduling a nurse visit for the first dose on Friday afternoon.   You can give tylenol after the vaccine if she is experiencing symptoms and seems uncomfortable.   Some additional health maintenance measures we should update are: Health Maintenance Due  Topic Date Due  . Hepatitis C Screening  Never done  . HIV Screening  Never done  . TETANUS/TDAP  Never done  . PAP SMEAR-Modifier  Never done  .     Call the clinic at [phone] if your symptoms worsen or you have any concerns.   Thank you for allowing me to take part in your care,  Dr. Jamelle Rushing

## 2020-08-06 ENCOUNTER — Ambulatory Visit: Payer: Medicaid Other

## 2020-08-09 ENCOUNTER — Other Ambulatory Visit (INDEPENDENT_AMBULATORY_CARE_PROVIDER_SITE_OTHER): Payer: Self-pay | Admitting: Pediatrics

## 2020-08-09 DIAGNOSIS — G40309 Generalized idiopathic epilepsy and epileptic syndromes, not intractable, without status epilepticus: Secondary | ICD-10-CM

## 2020-08-09 DIAGNOSIS — G40209 Localization-related (focal) (partial) symptomatic epilepsy and epileptic syndromes with complex partial seizures, not intractable, without status epilepticus: Secondary | ICD-10-CM

## 2020-08-09 MED ORDER — CARBAMAZEPINE ER 200 MG PO CP12: 200.0000 mg | ORAL_CAPSULE | Freq: Two times a day (BID) | ORAL | 0 refills | Status: DC

## 2020-08-09 NOTE — Telephone Encounter (Signed)
RX has been sent to the pharmacy.

## 2020-08-09 NOTE — Telephone Encounter (Signed)
Who's calling (name and relationship to patient) : Misty Stanley (mom)  Best contact number: [phone]  Provider they see: Dr. Sharene Skeans   Reason for call:  Mom called in stating that Kristin Fitzgerald was needing a refill on both her Carbatrol and Gabepentin. She does have a few doses left on her Carbatrol but she will be taking her last dose of Gabepentin this evening so she will not have that dose in the morning. Please advise   Call ID:      PRESCRIPTION REFILL ONLY  Name of prescription:  carbamazepine (CARBATROL) 200 MG 12 hr capsule [[id number]]  gabapentin (NEURONTIN) 100 MG capsule [[id number]]    Pharmacy[facility]    [id number] [place], [place] - [phone] [facility] [address]  [address] [place], [place] [address]

## 2020-08-11 ENCOUNTER — Other Ambulatory Visit: Payer: Self-pay

## 2020-08-11 ENCOUNTER — Encounter (INDEPENDENT_AMBULATORY_CARE_PROVIDER_SITE_OTHER): Payer: Self-pay | Admitting: Pediatrics

## 2020-08-11 ENCOUNTER — Ambulatory Visit (INDEPENDENT_AMBULATORY_CARE_PROVIDER_SITE_OTHER): Payer: Medicaid Other | Admitting: Pediatrics

## 2020-08-11 VITALS — BP 120/78 | HR 68 | Wt 71.8 lb

## 2020-08-11 DIAGNOSIS — G40309 Generalized idiopathic epilepsy and epileptic syndromes, not intractable, without status epilepticus: Secondary | ICD-10-CM

## 2020-08-11 DIAGNOSIS — M4145 Neuromuscular scoliosis, thoracolumbar region: Secondary | ICD-10-CM | POA: Diagnosis not present

## 2020-08-11 DIAGNOSIS — G40209 Localization-related (focal) (partial) symptomatic epilepsy and epileptic syndromes with complex partial seizures, not intractable, without status epilepticus: Secondary | ICD-10-CM

## 2020-08-11 DIAGNOSIS — G808 Other cerebral palsy: Secondary | ICD-10-CM | POA: Diagnosis not present

## 2020-08-11 DIAGNOSIS — F70 Mild intellectual disabilities: Secondary | ICD-10-CM | POA: Diagnosis not present

## 2020-08-11 DIAGNOSIS — R634 Abnormal weight loss: Secondary | ICD-10-CM | POA: Insufficient documentation

## 2020-08-11 MED ORDER — GABAPENTIN 100 MG PO CAPS: ORAL_CAPSULE | ORAL | 5 refills | Status: DC

## 2020-08-11 MED ORDER — CARBAMAZEPINE ER 200 MG PO CP12: 200.0000 mg | ORAL_CAPSULE | Freq: Two times a day (BID) | ORAL | 5 refills | Status: DC

## 2020-08-11 NOTE — Progress Notes (Signed)
Patient: Kristin Fitzgerald MRN: [id number] Sex: female DOB: 08/29/1990  Provider: Ellison Carwin, MD Location of Care: [facility]  Note type: Routine return visit  History of Present Illness: Referral Source: [facility]/Dr. Elwyn Reach History from: mother, patient and [facility] chart Chief Complaint: Seizures/Spastic Quadriparesis  Kristin Fitzgerald is a 30 y.o. female who was evaluated in [id number] [id number] for the first time since August 05, 2019.  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  Seizures have been well controlled for years on the combination of Carbatrol and gabapentin which he tolerates without side effects.  She is totally dependent on others for dressing and bathing changing her diaper, is unable to feed herself or prepare meals.  She has 76 CAPS hours per week.  Her mother works in childcare.  Fortunately she is not had to leave work to take care of [place].  She spent some time with her brother and his children.  He is about to get busier because his wife is expecting twins.  [name]'s behavior has improved since she was seen last time.  I am not certain why but mother is taking in approach to shut down oppositional behavior.  Both she and her mother contracted Covid on May 13, 2020 Kristin Fitzgerald had cough and tested positive.  She was admitted to the hospital and given monoclonal antibodies.  She and mother were quarantined for 10 days and recovered.  Mother lost her sense of smell and is regained it.  She is followed by [facility].  They are going to vaccinate her.  Because of her  small size she is going to get the pediatric vaccine.  I am not certain how she has lost 24 pounds since her last visit I suspect that in someway she was missed measured.  She has a phone that she uses to search the Internet.  She watches TV there is not much else that she does.  Review of Systems: A complete review of systems was remarkable for patient is here to be seen for seizures and spastic quadriparesis. Mom states that the patient has not had any seizures since her last visit. She has no concerns at this time., all other systems reviewed and negative.  Past Medical History Diagnosis Date  . Cerebral palsy (HCC)   . Mild intellectual disabilities   . Seizure disorder (HCC)   . Seizures (HCC)    Hospitalizations: No., Head Injury: No., Nervous System Infections: No., Immunizations up to date: Yes.    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  Birth History 3 lbs. 1 oz. Infant born at [redacted] weeks gestational age  Gestation was complicated by premature delivery for unknown reasons. Nursery Course was complicated by seizures a day 3 of life. The patient had no hemorrhage but had periventricular leukomalacia. Growth and Development was recalled as abnormal  Behavior History none  Surgical History Procedure Laterality Date  . CARDIAC SURGERY     Repaired valve in 2007-[place]  . EYE SURGERY       bilateral eye surgery in infancy  . HIP ARTHROPLASTY     for  recurrent dislocation; rebuilt acetabulum  . INGUINAL HERNIA REPAIR    . 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 OTHER SURGICAL HISTORY     [place] Surgery as an infant   Family History family history includes Asthma in her brother; Breast cancer in her maternal aunt; Kidney failure in her maternal grandmother; Leukemia in her maternal grandfather. Family history is  negative for migraines, seizures, intellectual disabilities, blindness, deafness, birth defects, chromosomal disorder, or autism.  Social History Socioeconomic History  . Marital status: Single  . Years of education: 33  . Highest education level:  High school certificate  Tobacco Use  . Smoking status: Passive Smoke Exposure - Never Smoker  . Smokeless tobacco: Never Used  . Tobacco comment: Mother and brother smoke   Substance and Sexual Activity  . Alcohol use: No    Alcohol/week: 0.0 standard drinks  . Drug use: No  . 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    She lives with her mother and her brother Berna Spare.   Allergies Allergen Reactions  . Demerol Nausea And Vomiting   Physical Exam BP 120/78   Pulse 68   Wt 71 lb 12.8 oz (32.6 kg)   LMP 07/15/2020   General: alert, well developed, well nourished, in no acute distress, black hair, brown eyes, left handed Head: normocephalic, no dysmorphic features Ears, Nose and Throat: Otoscopic: tympanic membranes normal; pharynx: oropharynx is pink without exudates or tonsillar hypertrophy Neck: supple, full range of motion, no cranial or cervical bruits Respiratory: auscultation clear Cardiovascular: no murmurs, pulses are normal Musculoskeletal: flexion contractures at the right elbow, both knees, tight heel cords, severe convex left neuromuscular scoliosis, right hemiatrophy involving the arm greater than the leg Skin: no rashes or neurocutaneous lesions  Neurologic Exam  Mental Status: alert; oriented to person, place and year; knowledge is below normal for age; language is low normal; she can name objects and follow commands; she has dysarthria but is intelligible Cranial Nerves: visual fields are full to double simultaneous stimuli; extraocular movements are full and conjugate; pupils are round reactive to light; funduscopic examination shows positive red reflex bilaterally  because of photophobia; symmetric facial strength; midline tongue and uvula; air conduction is greater than bone conduction bilaterally Motor: spastic quadriparesis with sparing of her left arm, dystonic posturing of the fingers of the right with clumsiness in both hands; she is unable to bring her thumb past her index finger bilaterally; 4/5 strength in the right arm, 4+/5 on the left, 1-2 in the right leg, 3/5 on the left; she is not able to spontaneously move either foot; she has significant limitation of range of motion of her right hip Sensory: intact responses to cold, vibration, proprioception and stereognosis Coordination: clumsy but she is able to bring her fingers between her nose and my finger touching with the side her finger; after repetitive movements are very slow and clumsy she is somewhat more facile with the left hand than the right. Gait and Station: wheelchair-bound.  She is unable to walk independently  Reflexes: symmetric and diminished bilaterally; no clonus; bilateral flexor plantar responses  Assessment 1.  Focal epilepsy with impairment of consciousness, G40.109. 2.  Generalized convulsive epilepsy, G40.309. 3.  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 4.  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 5.  Mild intellectual disability, F70. 6.  Unintended weight loss, R63.4.  Discussion Kristin Fitzgerald is stable medically and  neurologically except for her weight loss.  Plan Prescriptions were refilled for carbamazepine and gabapentin.  She will return in 6 months.  I hope at that time to be able to introduce her to her provider who will continue to care for her after I retire in March 25, 2021..  I asked mom to get up with me if there are any problems in the interim.  Greater than 50% of a 30-minute visit was spent in counseling and coordination of care concerning her seizures, her motor disorder, discussing Covid vaccination which is [facility] at [facility].  I am concerned about her weight loss.  He may need to have some nutritional counseling.  I have concerns that this may not have been measured correctly although she was measured in her wheelchair and then the wheelchair was measured separately so it should be accurate.   Medication List   Accurate as of August 11, 2020  8:31 AM. If you have any questions, ask your nurse or doctor.    carbamazepine 200 MG 12 hr capsule Commonly known as: CARBATROL Take 1 capsule (200 mg total) by mouth 2 (two) times daily.   cetirizine HCl 5 MG/5ML Syrp Commonly known as: Zyrtec Take 10 mLs (10 mg total) by mouth daily.   gabapentin 100 MG capsule Commonly known as: NEURONTIN TAKE 1 CAPSULE IN THE MORNING AND 2 CAPSULES AT BEDTIME    The medication list was reviewed and reconciled. All changes or newly prescribed medications were explained.  A complete medication list was provided to the patient/caregiver.  Deetta Perla MD

## 2020-08-11 NOTE — Patient Instructions (Signed)
It wass a pleasure to see you. I would like you to return in 6 months for evaluation.  Hopefully at that time I will be able to introduce you to your new provider.  I think given the number of problems that Kristin Fitzgerald has, that is best for her to stay in our practice.  I refilled both of the prescriptions because they only been given single refills.  Please let me know if I can help you between now and we plan to see her in 6 months.

## 2020-08-13 ENCOUNTER — Ambulatory Visit: Payer: Medicaid Other

## 2020-08-13 ENCOUNTER — Other Ambulatory Visit: Payer: Self-pay

## 2020-08-13 ENCOUNTER — Telehealth: Payer: Self-pay

## 2020-08-13 NOTE — Telephone Encounter (Signed)
Patient presents in nurse clinic for 1st covid vaccine. Mother reports she spoke with PCP about receiving a childrens dose verses adult dose due to her weight. Patient was seen by Neurology on 2/16 and weighed 71lbs, mother brings in paperwork with weight. It appears we did not weigh her at PCP visit on 02/9, however was noted she weighs ~95lbs. Mother is requesting childrens dose today after Neurology visit and recent weight. I precepted with [place] and was told to move forward with adult dosing after researching recommendations. Mother became very upset about this, as she stated she was told "something different by all different doctors." Mother has concerns with side effects of adult dose. Mother stated I do not feel comfortable giving her the adult dose and upset she took off work when she was told Lonie could receive a childrens dose. Mother and patient left without received a covid vaccine today.

## 2020-10-31 ENCOUNTER — Encounter (INDEPENDENT_AMBULATORY_CARE_PROVIDER_SITE_OTHER): Payer: Self-pay

## 2020-12-29 NOTE — Telephone Encounter (Signed)
ERROR

## 2021-01-27 ENCOUNTER — Emergency Department (HOSPITAL_COMMUNITY): Payer: Medicaid Other

## 2021-01-27 ENCOUNTER — Encounter (HOSPITAL_COMMUNITY): Payer: Self-pay | Admitting: Emergency Medicine

## 2021-01-27 ENCOUNTER — Emergency Department (HOSPITAL_COMMUNITY)
Admission: EM | Admit: 2021-01-27 | Discharge: 2021-01-27 | Disposition: A | Discharge status: Home / Self Care | Payer: Medicaid Other | Attending: Emergency Medicine | Admitting: Emergency Medicine

## 2021-01-27 DIAGNOSIS — Z7722 Contact with and (suspected) exposure to environmental tobacco smoke (acute) (chronic): Secondary | ICD-10-CM | POA: Insufficient documentation

## 2021-01-27 DIAGNOSIS — Z96649 Presence of unspecified artificial hip joint: Secondary | ICD-10-CM | POA: Diagnosis not present

## 2021-01-27 DIAGNOSIS — R0981 Nasal congestion: Secondary | ICD-10-CM | POA: Diagnosis present

## 2021-01-27 DIAGNOSIS — Z28310 Unvaccinated for covid-19: Secondary | ICD-10-CM | POA: Insufficient documentation

## 2021-01-27 DIAGNOSIS — Z8616 Personal history of COVID-19: Secondary | ICD-10-CM | POA: Diagnosis not present

## 2021-01-27 DIAGNOSIS — J069 Acute upper respiratory infection, unspecified: Secondary | ICD-10-CM | POA: Diagnosis not present

## 2021-01-27 DIAGNOSIS — Z20822 Contact with and (suspected) exposure to covid-19: Secondary | ICD-10-CM | POA: Diagnosis not present

## 2021-01-27 LAB — RESP PANEL BY RT-PCR (FLU A&B, COVID) ARPGX2
Influenza A by PCR: NEGATIVE
Influenza B by PCR: NEGATIVE
SARS Coronavirus 2 by RT PCR: NEGATIVE

## 2021-01-27 LAB — CBC WITH DIFFERENTIAL/PLATELET
Abs Immature Granulocytes: 0.01 10*3/uL (ref 0.00–0.07)
Basophils Absolute: 0 10*3/uL (ref 0.0–0.1)
Basophils Relative: 1 %
Eosinophils Absolute: 0 10*3/uL (ref 0.0–0.5)
Eosinophils Relative: 0 %
HCT: 35.1 % — ABNORMAL LOW (ref 36.0–46.0)
Hemoglobin: 10.5 g/dL — ABNORMAL LOW (ref 12.0–15.0)
Immature Granulocytes: 0 %
Lymphocytes Relative: 25 %
Lymphs Abs: 0.9 10*3/uL (ref 0.7–4.0)
MCH: 25.2 pg — ABNORMAL LOW (ref 26.0–34.0)
MCHC: 29.9 g/dL — ABNORMAL LOW (ref 30.0–36.0)
MCV: 84.2 fL (ref 80.0–100.0)
Monocytes Absolute: 0.5 10*3/uL (ref 0.1–1.0)
Monocytes Relative: 14 %
Neutro Abs: 2.1 10*3/uL (ref 1.7–7.7)
Neutrophils Relative %: 60 %
Platelets: 174 10*3/uL (ref 150–400)
RBC: 4.17 MIL/uL (ref 3.87–5.11)
RDW: 15.2 % (ref 11.5–15.5)
WBC: 3.4 10*3/uL — ABNORMAL LOW (ref 4.0–10.5)
nRBC: 0 % (ref 0.0–0.2)

## 2021-01-27 LAB — BASIC METABOLIC PANEL
Anion gap: 9 (ref 5–15)
BUN: 8 mg/dL (ref 6–20)
CO2: 24 mmol/L (ref 22–32)
Calcium: 8.7 mg/dL — ABNORMAL LOW (ref 8.9–10.3)
Chloride: 104 mmol/L (ref 98–111)
Creatinine, Ser: 0.64 mg/dL (ref 0.44–1.00)
GFR, Estimated: >60 mL/min (ref >60)
Glucose, Bld: 80 mg/dL (ref 70–99)
Potassium: 3.8 mmol/L (ref 3.5–5.1)
Sodium: 137 mmol/L (ref 135–145)

## 2021-01-27 NOTE — ED Triage Notes (Signed)
Patient here for complaint of nasal congestion and cough that started a few days ago. Patient afebrile in triage. History of cerebral palsy.

## 2021-01-27 NOTE — Discharge Instructions (Addendum)
Continue with the supportive over the counter meds. Monitor her breathing, activity level and food intake closely. If getting worse - return to the ER. The covid test will result in 2-4 hours - you can check the results on the Mycharts app.

## 2021-01-27 NOTE — ED Provider Notes (Signed)
BackEmergency Medicine Provider Triage Evaluation Note  Kristin Fitzgerald , a 30 y.o. female  was evaluated in triage.  Pt complains of presents with nasal congestion, productive cough, started on Tuesday, against COVID-19, denies recent sick contacts, not endorsing chest pain, shortness of breath, abdominal pain, nausea, vomit, diarrhea..  Review of Systems  Positive: 'nasal ingestion cough Negative: Chest pain, shortness of breath  Physical Exam  BP (!) 146/68   Pulse 80   Temp 98.9 F (37.2 C) (Oral)   Resp 16   SpO2 100%  Gen:   Awake, no distress   Resp:  Normal effort  MSK:   Moves extremities without difficulty  Other:    Medical Decision Making  Medically screening exam initiated at 10:24 AM.  Appropriate orders placed.  Kristin Fitzgerald was informed that the remainder of the evaluation will be completed by another provider, this initial triage assessment does not replace that evaluation, and the importance of remaining in the ED until their evaluation is complete.  Presents with URI-like symptoms patient will need further work-up.   Kristin Sage, PA-C 01/27/21 1025    Horton, Kristin Seal, DO 01/28/21 [phone]

## 2021-02-05 NOTE — ED Provider Notes (Signed)
[facility] EMERGENCY DEPARTMENT Provider Note   CSN: [id number] Arrival date & time: 01/27/21  2355     History Chief Complaint  Patient presents with   Cough    Kristin Fitzgerald is a 30 y.o. female.  HPI     30 year old with CP, seizures brought into the ER with chief complaint of nasal congestion and cough.  Symptoms have been present for 2 or 3 days at least.  No fevers with it.  Patient had COVID-19 late last year.  She does not have any underlying lung disease.  P.o. intake has gone down slightly.  Review of system is negative for any vomiting, diarrhea.  No history of recurrent UTI.  Family at bedside provides collateral history.  Level 5 caveat for intellectual delay.  Past Medical History:  Diagnosis Date   Cerebral palsy (HCC)    Mild intellectual disabilities    Seizure disorder (HCC)    Seizures (HCC)     Patient Active Problem List   Diagnosis Date Noted   Unintended weight loss 08/11/2020   COVID-19 vaccine dose not administered 08/04/2020   Adjustment disorder with emotional disturbance 08/05/2019   Need for immunization against influenza 05/07/2017   Nutrition impaired due to limited access to healthful foods 05/07/2017   Thrush 07/09/2016   Constipation 07/09/2016   Dysphagia 10/25/2015   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   Scoliosis associated with other condition 05/19/2013   Unspecified disorder of eye movements 05/19/2013   Mild intellectual disability 05/19/2013   Unspecified urinary incontinence 05/19/2013   Unspecified congenital cataract 05/19/2013   Cerebral palsy (HCC) 07/06/2011   Seizure disorder (HCC) 07/06/2011   Heart murmur 07/06/2011   Healthcare maintenance 07/06/2011    Past Surgical History:  Procedure Laterality  Date   CARDIAC SURGERY     Repaired valve in 2007-[place]   EYE SURGERY       bilateral eye surgery in infancy   HIP ARTHROPLASTY     for recurrent dislocation; rebuilt acetabulum   INGUINAL HERNIA REPAIR     KIDNEY SURGERY      kidney/bladder surgery in infancy for "blockage"   OTHER SURGICAL HISTORY     [place] Surgery as an infant     OB History   No obstetric history on file.     Family History  Problem Relation Age of Onset   Asthma Brother    Kidney failure Maternal Grandmother        Died at 50   Leukemia Maternal Grandfather        Died at 47   Breast cancer Maternal Aunt        Dx in 2010, In remission    Social History   Tobacco Use   Smoking status: Passive Smoke Exposure - Never Smoker   Smokeless tobacco: Never   Tobacco comments:    Mother and brother smoke   Substance Use Topics   Alcohol use: No    Alcohol/week: 0.0 standard drinks   Drug use: No    Home Medications Prior to Admission medications   Medication Sig Start Date End Date Taking? Authorizing Provider  carbamazepine (CARBATROL) 200 MG 12 hr capsule Take 1 capsule (200 mg total) by mouth 2 (two) times daily. 08/11/20   Deetta Perla, MD  cetirizine HCl (ZYRTEC) 5 MG/5ML SYRP Take 10  mLs (10 mg total) by mouth daily. 11/01/15   Bacigalupo, Marzella Schlein, MD  gabapentin (NEURONTIN) 100 MG capsule TAKE 1 CAPSULE IN THE MORNING AND 2 CAPSULES AT BEDTIME 08/11/20   Deetta Perla, MD    Allergies    Demerol  Review of Systems   Review of Systems  Unable to perform ROS: Other  Constitutional:  Positive for activity change.  HENT:  Positive for congestion.   Respiratory:  Positive for cough.   Gastrointestinal:  Negative for vomiting.  Allergic/Immunologic: Negative for immunocompromised state.  Neurological:  Negative for seizures.  All other systems reviewed and are negative.  Physical Exam Updated Vital Signs BP 136/64   Pulse 66   Temp 98.4 F (36.9 C)   Resp 18    SpO2 100%   Physical Exam Vitals and nursing note reviewed.  Constitutional:      Appearance: She is well-developed.  HENT:     Head: Atraumatic.  Cardiovascular:     Rate and Rhythm: Normal rate.  Pulmonary:     Effort: Pulmonary effort is normal.  Musculoskeletal:     Cervical back: Normal range of motion and neck supple.  Skin:    General: Skin is warm and dry.  Neurological:     Mental Status: She is alert and oriented to person, place, and time.    ED Results / Procedures / Treatments   Labs (all labs ordered are listed, but only abnormal results are displayed) Labs Reviewed  BASIC METABOLIC PANEL - Abnormal; Notable for the following components:      Result Value   Calcium 8.7 (*)    All other components within normal limits  CBC WITH DIFFERENTIAL/PLATELET - Abnormal; Notable for the following components:   WBC 3.4 (*)    Hemoglobin 10.5 (*)    HCT 35.1 (*)    MCH 25.2 (*)    MCHC 29.9 (*)    All other components within normal limits  RESP PANEL BY RT-PCR (FLU A&B, COVID) ARPGX2    EKG None  Radiology No results found.  Procedures Procedures   Medications Ordered in ED Medications - No data to display  ED Course  I have reviewed the triage vital signs and the nursing notes.  Pertinent labs & imaging results that were available during my care of the patient were reviewed by me and considered in my medical decision making (see chart for details).    MDM Rules/Calculators/A&P                           30 year old comes in with chief complaint of cough and congestion. Nontoxic-appearing, no respiratory distress, 100% O2 sats. Appropriate labs have returned and look fine.  Outpatient COVID-19 test ordered.  Strict ER return precautions have been discussed, and patient is agreeing with the plan and is comfortable with the workup done and the recommendations from the ER.    Final Clinical Impression(s) / ED Diagnoses Final diagnoses:  Viral URI  with cough    Rx / DC Orders ED Discharge Orders     None        Derwood Kaplan, MD 02/05/21 [phone]

## 2021-02-09 ENCOUNTER — Telehealth (INDEPENDENT_AMBULATORY_CARE_PROVIDER_SITE_OTHER): Payer: Medicaid Other | Admitting: Pediatrics

## 2021-02-22 ENCOUNTER — Telehealth: Payer: Self-pay

## 2021-02-22 NOTE — Telephone Encounter (Signed)
Patient's mother calls nurse line requesting updated form for personal care services. Patient is currently receiving services through [facility]. Mother is requesting [facility] plus to provide increased hours for approx 8 hours/day.   I have placed form in PCP box. Called and spoke with [facility]. They state that form will need to be completed with change of medical need and they will schedule in home evaluation to determine how many hours patient will receive. Scheduled patient for follow up with next available appointment with PCP on 10/3.  Veronda Prude, RN

## 2021-02-24 ENCOUNTER — Ambulatory Visit (INDEPENDENT_AMBULATORY_CARE_PROVIDER_SITE_OTHER): Payer: Medicaid Other | Admitting: Pediatrics

## 2021-03-02 NOTE — Telephone Encounter (Signed)
Discussed form with mother of patient via phone. Form filled out and signed. Placed in triage bin.   Kristin Show Autry-Lott, DO 03/02/2021, 3:27 PM PGY-3, [facility]

## 2021-03-11 ENCOUNTER — Other Ambulatory Visit (INDEPENDENT_AMBULATORY_CARE_PROVIDER_SITE_OTHER): Payer: Self-pay | Admitting: Pediatrics

## 2021-03-11 DIAGNOSIS — G40209 Localization-related (focal) (partial) symptomatic epilepsy and epileptic syndromes with complex partial seizures, not intractable, without status epilepticus: Secondary | ICD-10-CM

## 2021-03-11 DIAGNOSIS — G40309 Generalized idiopathic epilepsy and epileptic syndromes, not intractable, without status epilepticus: Secondary | ICD-10-CM

## 2021-03-14 NOTE — Telephone Encounter (Signed)
Delay in documentation.   Forms faxed to [employer] last week.   Veronda Prude, RN

## 2021-03-27 NOTE — Progress Notes (Deleted)
    SUBJECTIVE:   CHIEF COMPLAINT / HPI:   Flu Tdap  PERTINENT  PMH / PSH: ***  OBJECTIVE:   There were no vitals taken for this visit.  ***  ASSESSMENT/PLAN:   No problem-specific Assessment & Plan notes found for this encounter.     Kristin Jumbo, DO [facility]

## 2021-03-28 ENCOUNTER — Ambulatory Visit: Payer: Medicaid Other | Admitting: Family Medicine

## 2021-05-17 ENCOUNTER — Other Ambulatory Visit (INDEPENDENT_AMBULATORY_CARE_PROVIDER_SITE_OTHER): Payer: Self-pay | Admitting: Pediatrics

## 2021-05-17 DIAGNOSIS — G40309 Generalized idiopathic epilepsy and epileptic syndromes, not intractable, without status epilepticus: Secondary | ICD-10-CM

## 2021-05-17 DIAGNOSIS — G40209 Localization-related (focal) (partial) symptomatic epilepsy and epileptic syndromes with complex partial seizures, not intractable, without status epilepticus: Secondary | ICD-10-CM

## 2021-05-17 MED ORDER — CARBAMAZEPINE ER 200 MG PO CP12: 200.0000 mg | ORAL_CAPSULE | Freq: Two times a day (BID) | ORAL | 3 refills | Status: DC

## 2021-05-17 NOTE — Telephone Encounter (Signed)
  Who's calling (name and relationship to patient) :mom/ Kristin Fitzgerald contact [phone]   Provider they see: Kristin Fitzgerald patient   Reason for call:mom called stating that she has spoke with the pharmacy and they have sent over refill requested but when she went to the pharmacy she was told to contact the office. Mom stated that she only has enough medication to last until the morning      PRESCRIPTION REFILL ONLY  Name of prescription:Carbatrol   Pharmacy:[facility] [address]

## 2021-07-15 ENCOUNTER — Emergency Department (HOSPITAL_COMMUNITY)
Admission: EM | Admit: 2021-07-15 | Discharge: 2021-07-16 | Disposition: A | Discharge status: Home / Self Care | Payer: Medicaid Other | Attending: Emergency Medicine | Admitting: Emergency Medicine

## 2021-07-15 ENCOUNTER — Emergency Department (HOSPITAL_COMMUNITY): Payer: Medicaid Other

## 2021-07-15 ENCOUNTER — Other Ambulatory Visit: Payer: Self-pay

## 2021-07-15 ENCOUNTER — Encounter (HOSPITAL_COMMUNITY): Payer: Self-pay | Admitting: Emergency Medicine

## 2021-07-15 DIAGNOSIS — E876 Hypokalemia: Secondary | ICD-10-CM | POA: Diagnosis not present

## 2021-07-15 DIAGNOSIS — U071 COVID-19: Secondary | ICD-10-CM | POA: Diagnosis not present

## 2021-07-15 DIAGNOSIS — D72819 Decreased white blood cell count, unspecified: Secondary | ICD-10-CM | POA: Diagnosis not present

## 2021-07-15 DIAGNOSIS — R739 Hyperglycemia, unspecified: Secondary | ICD-10-CM | POA: Insufficient documentation

## 2021-07-15 DIAGNOSIS — R059 Cough, unspecified: Secondary | ICD-10-CM | POA: Diagnosis present

## 2021-07-15 DIAGNOSIS — E871 Hypo-osmolality and hyponatremia: Secondary | ICD-10-CM | POA: Diagnosis not present

## 2021-07-15 LAB — CBC WITH DIFFERENTIAL/PLATELET
Abs Immature Granulocytes: 0.01 10*3/uL (ref 0.00–0.07)
Basophils Absolute: 0 10*3/uL (ref 0.0–0.1)
Basophils Relative: 0 %
Eosinophils Absolute: 0 10*3/uL (ref 0.0–0.5)
Eosinophils Relative: 0 %
HCT: 35.6 % — ABNORMAL LOW (ref 36.0–46.0)
Hemoglobin: 11.8 g/dL — ABNORMAL LOW (ref 12.0–15.0)
Immature Granulocytes: 0 %
Lymphocytes Relative: 8 %
Lymphs Abs: 0.3 10*3/uL — ABNORMAL LOW (ref 0.7–4.0)
MCH: 27.2 pg (ref 26.0–34.0)
MCHC: 33.1 g/dL (ref 30.0–36.0)
MCV: 82 fL (ref 80.0–100.0)
Monocytes Absolute: 0.5 10*3/uL (ref 0.1–1.0)
Monocytes Relative: 15 %
Neutro Abs: 2.4 10*3/uL (ref 1.7–7.7)
Neutrophils Relative %: 77 %
Platelets: 189 10*3/uL (ref 150–400)
RBC: 4.34 MIL/uL (ref 3.87–5.11)
RDW: 14.5 % (ref 11.5–15.5)
WBC: 3.1 10*3/uL — ABNORMAL LOW (ref 4.0–10.5)
nRBC: 0 % (ref 0.0–0.2)

## 2021-07-15 LAB — BASIC METABOLIC PANEL
Anion gap: 9 (ref 5–15)
BUN: 7 mg/dL (ref 6–20)
CO2: 23 mmol/L (ref 22–32)
Calcium: 9.1 mg/dL (ref 8.9–10.3)
Chloride: 101 mmol/L (ref 98–111)
Creatinine, Ser: 0.52 mg/dL (ref 0.44–1.00)
GFR, Estimated: >60 mL/min (ref >60)
Glucose, Bld: 133 mg/dL — ABNORMAL HIGH (ref 70–99)
Potassium: 3.4 mmol/L — ABNORMAL LOW (ref 3.5–5.1)
Sodium: 133 mmol/L — ABNORMAL LOW (ref 135–145)

## 2021-07-15 LAB — RESP PANEL BY RT-PCR (FLU A&B, COVID) ARPGX2
Influenza A by PCR: NEGATIVE
Influenza B by PCR: NEGATIVE
SARS Coronavirus 2 by RT PCR: POSITIVE — AB

## 2021-07-15 NOTE — ED Provider Triage Note (Addendum)
Emergency Medicine Provider Triage Evaluation Note  Kristin Fitzgerald , a 31 y.o. female  was evaluated in triage.  Patient has a history of cerebral palsy, seizure disorder, mild intellectual disabilities.  Pt complains of cough.  Relative at bedside states that she began experiencing a cough earlier today.  States it is dry.  Reports associated low-grade fever of 99.8 F.  She states that she gave her a dose of ibuprofen and brought her to the emergency department.  Patient confirms the above history.  Denies any other complaints.  Denies any dysuria or pain.  Physical Exam  BP 120/64 (BP Location: Left Arm)    Pulse 98    Temp 98.5 F (36.9 C) (Oral)    Resp 16    SpO2 96%  Gen:   Awake, no distress   Resp:  Normal effort  MSK:   Moves extremities without difficulty  Other:    Medical Decision Making  Medically screening exam initiated at 10:36 PM.  Appropriate orders placed.  Kristin Fitzgerald was informed that the remainder of the evaluation will be completed by another provider, this initial triage assessment does not replace that evaluation, and the importance of remaining in the ED until their evaluation is complete.  Given patient's significant medical history will obtain respiratory panel, basic labs, as well as a chest x-ray.  While patient was in the waiting room she noted chest pain.  We will additionally order troponins as well as an ECG.   Placido Sou, PA-C 07/15/21 2237    Placido Sou, PA-C 07/16/21 0018

## 2021-07-15 NOTE — ED Triage Notes (Signed)
Family reports nasal congestion , runny nose/sneezing and dry cough with low grade fever today .

## 2021-07-16 LAB — I-STAT BETA HCG BLOOD, ED (MC, WL, AP ONLY): I-stat hCG, quantitative: <5 m[IU]/mL (ref <5)

## 2021-07-16 LAB — TROPONIN I (HIGH SENSITIVITY): Troponin I (High Sensitivity): 6 ng/L (ref <18)

## 2021-07-16 NOTE — Discharge Instructions (Addendum)
As we discussed you can use Motrin, Tylenol liquid for fever, body aches.  You can use Dimetapp, Mucinex for congestion, cold and flu symptoms.  Encourage her to drink plenty of fluids, get plenty of rest.  If you have concern that she is worsening, she has fever that is not improving with at home medications, or her shortness of breath, or chest pain becomes persistent, severe please return to the emergency department for further evaluation.

## 2021-07-16 NOTE — ED Provider Notes (Signed)
[facility] EMERGENCY DEPARTMENT Provider Note   CSN: [id number] Arrival date & time: 07/15/21  2147     History  Chief Complaint  Patient presents with   Cough    Covid+    Kristin Fitzgerald is a 31 y.o. female Patient with hx of cerebral palsy who presents with one day of cough, fever, chest discomfort, headache. At time of my eval patient without chest discomfort, fever, headache, just reporting she feels sleepy. No nausea, vomiting, diarrhea. Mother reports she had infusion last time she had covid with good benefit.  History of seizure disorder, has been taking her prophylactic seizure medication as prescribed.  Patient denies chest pain worse with exertion.  No history of cardiac chest pain.  No history of tobacco use.     Cough     Home Medications Prior to Admission medications   Medication Sig Start Date End Date Taking? Authorizing Provider  carbamazepine (CARBATROL) 200 MG 12 hr capsule Take 1 capsule (200 mg total) by mouth 2 (two) times daily. 05/17/21   Lezlie Lye, MD  cetirizine HCl (ZYRTEC) 5 MG/5ML SYRP Take 10 mLs (10 mg total) by mouth daily. 11/01/15   Bacigalupo, Marzella Schlein, MD  gabapentin (NEURONTIN) 100 MG capsule TAKE 1 CAPSULE IN THE MORNING AND 2 CAPSULES AT BEDTIME 03/11/21   Deetta Perla, MD      Allergies    Demerol    Review of Systems   Review of Systems  HENT:  Positive for congestion.   Respiratory:  Positive for cough and chest tightness.   All other systems reviewed and are negative.  Physical Exam Updated Vital Signs BP (!) 112/59    Pulse 81    Temp 98.5 F (36.9 C) (Oral)    Resp 20    SpO2 98%  Physical Exam Vitals and nursing note reviewed.  Constitutional:      General: She is not in acute distress.    Appearance: Normal appearance.     Comments: Diminutive appearance, and facial characteristics consistent with cerebral palsy  HENT:     Head: Atraumatic.     Mouth/Throat:     Comments:  Posterior oropharynx clear, mucous membranes wet.  Uvula midline. Eyes:     General:        Right eye: No discharge.        Left eye: No discharge.  Cardiovascular:     Rate and Rhythm: Normal rate and regular rhythm.     Heart sounds: No murmur heard.   No friction rub. No gallop.  Pulmonary:     Effort: Pulmonary effort is normal.     Breath sounds: Normal breath sounds.     Comments: Normal breath sounds throughout, no wheezing, no rhonchi, no stridor. Abdominal:     General: Bowel sounds are normal.     Palpations: Abdomen is soft.  Musculoskeletal:     Cervical back: Neck supple. No rigidity.  Lymphadenopathy:     Cervical: No cervical adenopathy.  Skin:    General: Skin is warm and dry.     Capillary Refill: Capillary refill takes less than 2 seconds.  Neurological:     Mental Status: She is alert and oriented to person, place, and time.  Psychiatric:        Mood and Affect: Mood normal.        Behavior: Behavior normal.    ED Results / Procedures / Treatments   Labs (all labs ordered are listed, but only  abnormal results are displayed) Labs Reviewed  RESP PANEL BY RT-PCR (FLU A&B, COVID) ARPGX2 - Abnormal; Notable for the following components:      Result Value   SARS Coronavirus 2 by RT PCR POSITIVE (*)    All other components within normal limits  CBC WITH DIFFERENTIAL/PLATELET - Abnormal; Notable for the following components:   WBC 3.1 (*)    Hemoglobin 11.8 (*)    HCT 35.6 (*)    Lymphs Abs 0.3 (*)    All other components within normal limits  BASIC METABOLIC PANEL - Abnormal; Notable for the following components:   Sodium 133 (*)    Potassium 3.4 (*)    Glucose, Bld 133 (*)    All other components within normal limits  I-STAT BETA HCG BLOOD, ED ([facility], [facility], [facility] ONLY)  TROPONIN I (HIGH SENSITIVITY)    EKG None  Radiology DG Chest 2 View  Result Date: 07/15/2021 CLINICAL DATA:  Cough EXAM: CHEST - 2 VIEW COMPARISON:  01/27/2021 FINDINGS: Post sternotomy  changes. No consolidation or pleural effusion. Normal cardiomediastinal silhouette. No pneumothorax. IMPRESSION: No active cardiopulmonary disease. Electronically Signed   By: Jasmine Pang M.D.   On: 07/15/2021 23:35    Procedures Procedures    Medications Ordered in ED Medications - No data to display  ED Course/ Medical Decision Making/ A&P                            Medical Decision Making  Well-appearing on physical exam, no accessory lung sounds, normal heart rate and rhythm.  No tenderness palpation abdomen.  No stridor, rhonchi, wheezing.  Oropharynx clear.  Afebrile, no acute distress.  Patient with 1 day of congestion, runny nose, dry cough, low-grade fever, chest tightness.  My differential diagnosis includes upper respiratory infection viral origin, acute bronchitis, pneumonia, less clinical concern for ACS, PE, tamponade, Boerhaave's, GERD.  With stable vital signs at time my evaluation minimal clinical concern for fulminant bacterial infection at this time.  Patient will history obtained from mother.  Patient with significant comorbidity of cerebral palsy, intellectual delay, seizure disorder, congenital quadriplegia.  Work which is significant for overall stable CBC with slight anemia, slight leukocytopenia consistent with viral infection.  BMP is unremarkable other than mild hyperglycemia 133, mild hyponatremia, hypokalemia.  Respiratory virus panel was positive for COVID.  Troponin negative x1 in context of chest tightness for greater than 6 hours.  Patient not pregnant at this time.  Believe the patient's symptoms are consistent with COVID infection.  Discussed treatment options with mother. Mother would like patient to receive infusion as she had benefited last time, unclear whether we still offer infusions, will consult pharmacy.  Patient does not take pills by mouth.  Patient cannot take Paxlovid due to carbamazepine medication being a contraindication.  She cannot  Molnupiravir because she does not take pills that cannot be crushed.  Discussed use of Mab one-time infusion however poor effectiveness against current COVID strains, and patient only minimally meets criteria at this time secondary to her comorbidities, although she appears to have a mild disease process at this time.  Discussed symptomatic control and care with mother, and discharged in stable condition at this time.  Encouraged follow-up with PCP.  Encouraged to return to the emergency department if symptoms worsen, especially with significant shortness of breath, respiratory distress, chest pain.  Patient and mother understand and agree to plan, discharged in stable condition at this time. Final  Clinical Impression(s) / ED Diagnoses Final diagnoses:  COVID-19    Rx / DC Orders ED Discharge Orders     None         [place] Balirosperi, Rhyleigh Grassel H, PA-C 07/16/21 0815    Benjiman CorePickering, Nathan, MD 07/16/21 1942

## 2021-07-17 ENCOUNTER — Encounter (HOSPITAL_COMMUNITY): Payer: Self-pay

## 2021-07-17 ENCOUNTER — Emergency Department (HOSPITAL_COMMUNITY)
Admission: EM | Admit: 2021-07-17 | Discharge: 2021-07-17 | Disposition: A | Discharge status: Home / Self Care | Payer: Medicaid Other | Attending: Emergency Medicine | Admitting: Emergency Medicine

## 2021-07-17 ENCOUNTER — Emergency Department (HOSPITAL_COMMUNITY): Payer: Medicaid Other

## 2021-07-17 DIAGNOSIS — U071 COVID-19: Secondary | ICD-10-CM | POA: Insufficient documentation

## 2021-07-17 DIAGNOSIS — R059 Cough, unspecified: Secondary | ICD-10-CM | POA: Diagnosis present

## 2021-07-17 NOTE — ED Provider Notes (Signed)
[facility] EMERGENCY DEPARTMENT Provider Note   CSN: [id number] Arrival date & time: 07/17/21  1505     History  Chief Complaint  Patient presents with   Cough   Chest Pain    Kristin Fitzgerald is a 31 y.o. female.  Patient is a 31 year old female with a history of cerebral palsy and intellectual disabilities who presents with cough and chest pain.  She was recently diagnosed a couple of days ago with COVID 19.  Mom said she has been using some over-the-counter medicines for symptomatically.  She was not a candidate for the oral antiviral medications.  Today she had a bad coughing spell and was complaining her chest was hurting.  Mom brought her here for further evaluation.  Although after that happened she is only complaining of pain in her chest when she coughs.  She currently does not have any chest pain.  She mom has not noticed any trouble breathing.  She has had no vomiting.  She had some decreased appetite but was eating better today.      Home Medications Prior to Admission medications   Medication Sig Start Date End Date Taking? Authorizing Provider  carbamazepine (CARBATROL) 200 MG 12 hr capsule Take 1 capsule (200 mg total) by mouth 2 (two) times daily. 05/17/21   Franco Nones, MD  cetirizine HCl (ZYRTEC) 5 MG/5ML SYRP Take 10 mLs (10 mg total) by mouth daily. 11/01/15   Bacigalupo, Dionne Bucy, MD  gabapentin (NEURONTIN) 100 MG capsule TAKE 1 CAPSULE IN THE MORNING AND 2 CAPSULES AT BEDTIME 03/11/21   Jodi Geralds, MD      Allergies    Demerol    Review of Systems   Review of Systems  Constitutional:  Positive for appetite change and fatigue. Negative for chills, diaphoresis and fever.  HENT:  Positive for congestion and rhinorrhea. Negative for sneezing.   Eyes: Negative.   Respiratory:  Positive for cough. Negative for chest tightness and shortness of breath.   Cardiovascular:  Negative for chest pain and leg swelling.   Gastrointestinal:  Negative for abdominal pain, blood in stool, diarrhea, nausea and vomiting.  Genitourinary:  Negative for difficulty urinating, flank pain, frequency and hematuria.  Musculoskeletal:  Negative for arthralgias and back pain.  Skin:  Negative for rash.  Neurological:  Negative for dizziness, speech difficulty, weakness, numbness and headaches.   Physical Exam Updated Vital Signs BP 139/78    Pulse 94    Temp 98.5 F (36.9 C) (Oral)    Resp 18    Wt 32.6 kg    SpO2 99%  Physical Exam Constitutional:      Appearance: She is well-developed.     Comments: Small stature consistent with her cerebral palsy  HENT:     Head: Normocephalic and atraumatic.  Eyes:     Pupils: Pupils are equal, round, and reactive to light.  Cardiovascular:     Rate and Rhythm: Normal rate and regular rhythm.     Heart sounds: Normal heart sounds.  Pulmonary:     Effort: Pulmonary effort is normal. No respiratory distress.     Breath sounds: Normal breath sounds. No wheezing or rales.  Chest:     Chest wall: No tenderness.  Abdominal:     General: Bowel sounds are normal.     Palpations: Abdomen is soft.     Tenderness: There is no abdominal tenderness. There is no guarding or rebound.  Musculoskeletal:  General: Normal range of motion.     Cervical back: Normal range of motion and neck supple.  Lymphadenopathy:     Cervical: No cervical adenopathy.  Skin:    General: Skin is warm and dry.     Findings: No rash.  Neurological:     Mental Status: She is alert and oriented to person, place, and time.    ED Results / Procedures / Treatments   Labs (all labs ordered are listed, but only abnormal results are displayed) Labs Reviewed - No data to display  EKG EKG Interpretation  Date/Time:  Sunday July 17 2021 15:35:39 EST Ventricular Rate:  89 PR Interval:  124 QRS Duration: 86 QT Interval:  350 QTC Calculation: 425 R Axis:   42 Text Interpretation: Normal sinus  rhythm Normal ECG When compared with ECG of 16-Jul-2021 00:19, PREVIOUS ECG IS PRESENT Confirmed by Malvin Johns [phone]) on 07/17/2021 7:08:55 PM  Radiology DG Chest 2 View  Result Date: 07/17/2021 CLINICAL DATA:  Cough.  Chest pain.  Recent COVID diagnosis EXAM: CHEST - 2 VIEW COMPARISON:  07/15/2021 FINDINGS: Low lung volumes are present, causing crowding of the pulmonary vasculature. As before the patient's chest is tilted to the right with some crowding of the right-sided ribs, probably related to substantial upper lumbar scoliosis. Prior sternotomy. No airspace opacity is identified. Mild vascular crowding on the right. No blunting of the costophrenic angles. IMPRESSION: 1.  No active cardiopulmonary disease is radiographically apparent. 2. Scoliosis and chronic volume loss in the right hemithorax. Electronically Signed   By: Van Clines M.D.   On: 07/17/2021 16:20   DG Chest 2 View  Result Date: 07/15/2021 CLINICAL DATA:  Cough EXAM: CHEST - 2 VIEW COMPARISON:  01/27/2021 FINDINGS: Post sternotomy changes. No consolidation or pleural effusion. Normal cardiomediastinal silhouette. No pneumothorax. IMPRESSION: No active cardiopulmonary disease. Electronically Signed   By: Donavan Foil M.D.   On: 07/15/2021 23:35    Procedures Procedures    Medications Ordered in ED Medications - No data to display  ED Course/ Medical Decision Making/ A&P                           Medical Decision Making  Patient presents with some chest pain.  History was obtained with the help of mom.  She is only having chest pain when she coughs.  She does not have any hypoxia.  No tachycardia.  No other symptoms that sound more concerning for PE.  No symptoms that would be more concerning for ACS.  She had a chest x-ray which was reviewed by me and shows no evidence of pneumonia or edema.  She is smiling and interactive.  She currently says that she does not have any chest pain.  I feel she likely was having  some pain that was musculoskeletal related to the coughing.  Mom is advised in symptomatic care.  Return precautions were given.  Final Clinical Impression(s) / ED Diagnoses Final diagnoses:  COVID-19 virus infection    Rx / DC Orders ED Discharge Orders     None         Malvin Johns, MD 07/17/21 1933

## 2021-07-17 NOTE — ED Notes (Signed)
Patient and mother verbalize understanding of discharge instructions. Opportunity for questioning and answers were provided. Armband removed by staff, pt discharged from ED. Wheeled out to lobby in [employer]

## 2021-07-17 NOTE — ED Provider Triage Note (Signed)
Emergency Medicine Provider Triage Evaluation Note  Kristin Fitzgerald , a 31 y.o. female  was evaluated in triage.  Pt complains of cough, chest pain.  She was recently diagnosed with COVID.  Her mother is concerned that she is nonambulatory that she may be developing pneumonia.  Review of Systems  Positive:  Negative: See above  Physical Exam  BP 137/70 (BP Location: Left Arm)    Pulse 86    Temp 98.5 F (36.9 C) (Oral)    Resp 14    Wt 32.6 kg    SpO2 100%  Gen:   Awake, no distress   Resp:  Normal effort frequent dry cough Other:  Patient points to the center of her chest when asked where she hurts.  Medical Decision Making  Medically screening exam initiated at 3:44 PM.  Appropriate orders placed.  Kristin Fitzgerald was informed that the remainder of the evaluation will be completed by another provider, this initial triage assessment does not replace that evaluation, and the importance of remaining in the ED until their evaluation is complete.  EKG is already obtained, will obtain chest x-ray.   Cristina Gong, [place] 07/17/21 1546

## 2021-07-17 NOTE — ED Triage Notes (Signed)
Pt arrives POV w/ mother for eval of coughing and chest pain since covid DX on Friday night. Mom reports coughing spells and pt clutching chest reporting pain.

## 2021-07-18 ENCOUNTER — Telehealth: Payer: Self-pay

## 2021-07-18 DIAGNOSIS — R300 Dysuria: Secondary | ICD-10-CM

## 2021-07-18 MED ORDER — CEPHALEXIN 250 MG/5ML PO SUSR: 500.0000 mg | Freq: Two times a day (BID) | ORAL | 0 refills | Status: AC

## 2021-07-18 NOTE — Telephone Encounter (Signed)
Patients mother calls nurse line reporting UTI symptoms. Mother reports dysuria, abnormal urine odor and color. Mother reports she has been "holing it" because it "stings" when she urinates. Mother reports symptoms have been present for a few days now.   Mother reports they both have covid as of 1/20. Mother reprots they are doing ok, they just have a slight cough. Mother denies fevers, SOB, congestion or GI upset. Mother is monitoring her o2 sats with a pulse ox.   Mother is requesting an antibiotic for UTI. Mother reports I don't want [place] both in there being covid positive.   Will forward to PCP for advisement.

## 2021-07-18 NOTE — Telephone Encounter (Signed)
Called mother. Discussed symptoms. Plan to treat. Advised to go to ED if develop fever, nausea, vomiting, and back pain. Advised to follow up with office if symptoms do not improve. She voiced understanding.   Lavonda Jumbo, DO 07/18/2021, 5:00 PM PGY-3, [facility]

## 2021-07-21 ENCOUNTER — Emergency Department (HOSPITAL_COMMUNITY)
Admission: EM | Admit: 2021-07-21 | Discharge: 2021-07-22 | Disposition: A | Discharge status: Home / Self Care | Payer: Medicaid Other | Attending: Emergency Medicine | Admitting: Emergency Medicine

## 2021-07-21 ENCOUNTER — Other Ambulatory Visit: Payer: Self-pay

## 2021-07-21 DIAGNOSIS — R000 Tachycardia, unspecified: Secondary | ICD-10-CM | POA: Insufficient documentation

## 2021-07-21 DIAGNOSIS — R051 Acute cough: Secondary | ICD-10-CM | POA: Diagnosis not present

## 2021-07-21 DIAGNOSIS — R059 Cough, unspecified: Secondary | ICD-10-CM | POA: Diagnosis present

## 2021-07-21 DIAGNOSIS — U071 COVID-19: Secondary | ICD-10-CM | POA: Insufficient documentation

## 2021-07-21 MED ORDER — AEROCHAMBER PLUS FLO-VU LARGE MISC
Status: AC
  Filled 2021-07-21: qty 1

## 2021-07-21 MED ORDER — ALBUTEROL SULFATE HFA 108 (90 BASE) MCG/ACT IN AERS
2.0000 | INHALATION_SPRAY | Freq: Once | RESPIRATORY_TRACT | Status: AC
  Administered 2021-07-22: 2 via RESPIRATORY_TRACT
  Filled 2021-07-21: qty 6.7

## 2021-07-21 MED ORDER — AEROCHAMBER PLUS FLO-VU LARGE MISC
1.0000 | Freq: Once | Status: AC
  Administered 2021-07-22: 1

## 2021-07-21 NOTE — ED Provider Triage Note (Signed)
Emergency Medicine Provider Triage Evaluation Note  Kristin Fitzgerald , a 30 y.o. female with hx of cerebral palsy, intellectual delay, seizure disorder, congenital quadriplegia was evaluated in triage.  Pt complains of persistent cough. Cough worse at night, persistent. No improvement with generic Mucinex or 2 TBSP of honey. No fevers, SOB, cyanosis, apnea. Mother monitoring home pulse ox with lowest reading of 94%. Tested positive for COVID-19 in the ED on 07/15/21.  Mother also concerned about potential UTI given patient voicing sporadic c/o dysuria. No N/V, flank pain.  Review of Systems  Positive: As above Negative: As above  Physical Exam  BP (!) 153/123 (BP Location: Right Arm)    Pulse (!) 106    Temp 97.7 F (36.5 C) (Oral)    Resp 20    LMP 07/19/2021 (Approximate)    SpO2 90%  Gen:   Awake, no distress   Resp:  Normal effort  MSK:   Moves extremities without difficulty  Other:  Lungs CTAB. Spastic, dry, nonproductive cough.  Medical Decision Making  Medically screening exam initiated at 10:39 PM.  Appropriate orders placed.  Kristin Fitzgerald was informed that the remainder of the evaluation will be completed by another provider, this initial triage assessment does not replace that evaluation, and the importance of remaining in the ED until their evaluation is complete.  Cough 2/2 COVID-19 infection - will trial with albuterol pending room placement Dysuria - UA ordered   Antony Madura, PA-C 07/21/21 2242

## 2021-07-21 NOTE — ED Provider Notes (Signed)
[facility] EMERGENCY DEPARTMENT Provider Note   CSN: [id number] Arrival date & time: 07/21/21  2219     History  No chief complaint on file.   Kristin Fitzgerald is a 31 y.o. female.  31 y/o female with hx of cerebral palsy, intellectual delay, seizure disorder, congenital quadriplegia was evaluated in triage.  Pt complains of persistent cough. Cough worse at night, persistent. No improvement with generic Mucinex or 2 TBSP of honey. No fevers, SOB, cyanosis, apnea. Mother monitoring home pulse ox with lowest reading of 94%. Tested positive for COVID-19 in the ED on 07/15/21.   Mother also concerned about potential UTI given patient voicing sporadic c/o dysuria. No N/V, flank pain.  The history is provided by a parent and the patient. No language interpreter was used.     Home Medications Prior to Admission medications   Medication Sig Start Date End Date Taking? Authorizing Provider  magic mouthwash (lidocaine, diphenhydrAMINE, alum & mag hydroxide) suspension Swish and swallow 5 mLs 4 (four) times daily as needed (sore throat). 07/22/21  Yes Antony Madura, PA-C  carbamazepine (CARBATROL) 200 MG 12 hr capsule Take 1 capsule (200 mg total) by mouth 2 (two) times daily. 05/17/21   Abdelmoumen, Jenna Luo, MD  cephALEXin (KEFLEX) 250 MG/5ML suspension Take 10 mLs (500 mg total) by mouth 2 (two) times daily for 5 days. 07/18/21 07/23/21  Autry-Lott, Randa Evens, DO  cetirizine HCl (ZYRTEC) 5 MG/5ML SYRP Take 10 mLs (10 mg total) by mouth daily. 11/01/15   Bacigalupo, Marzella Schlein, MD  gabapentin (NEURONTIN) 100 MG capsule TAKE 1 CAPSULE IN THE MORNING AND 2 CAPSULES AT BEDTIME 03/11/21   Deetta Perla, MD      Allergies    Demerol    Review of Systems   Review of Systems Ten systems reviewed and are negative for acute change, except as noted in the HPI.    Physical Exam Updated Vital Signs BP 133/72    Pulse 95    Temp 97.7 F (36.5 C) (Oral)    Resp (!) 22    LMP  07/19/2021 (Approximate)    SpO2 98%   Physical Exam Vitals and nursing note reviewed.  Constitutional:      General: She is not in acute distress.    Appearance: She is well-developed. She is not diaphoretic.     Comments: Patient in NAD  HENT:     Head: Normocephalic and atraumatic.  Eyes:     General: No scleral icterus.    Conjunctiva/sclera: Conjunctivae normal.  Cardiovascular:     Rate and Rhythm: Regular rhythm. Tachycardia present.     Pulses: Normal pulses.     Heart sounds: Murmur heard.     Comments: Mild tachycardia Pulmonary:     Effort: Pulmonary effort is normal. No respiratory distress.     Breath sounds: No wheezing or rhonchi.     Comments: Lungs CTAB. Spastic, dry, nonproductive cough. Musculoskeletal:        General: Normal range of motion.     Cervical back: Normal range of motion.  Skin:    General: Skin is warm and dry.     Coloration: Skin is not pale.     Findings: No erythema or rash.  Neurological:     Mental Status: She is alert and oriented to person, place, and time.     Coordination: Coordination normal.  Psychiatric:        Behavior: Behavior normal.    ED Results / Procedures /  Treatments   Labs (all labs ordered are listed, but only abnormal results are displayed) Labs Reviewed  URINALYSIS, ROUTINE W REFLEX MICROSCOPIC - Abnormal; Notable for the following components:      Result Value   APPearance HAZY (*)    All other components within normal limits  PREGNANCY, URINE    EKG None  Radiology No results found.  Procedures Procedures    Medications Ordered in ED Medications  fluticasone (FLONASE) 50 MCG/ACT nasal spray 2 spray (2 sprays Each Nare Given 07/22/21 0118)  albuterol (VENTOLIN HFA) 108 (90 Base) MCG/ACT inhaler 2 puff (2 puffs Inhalation Given 07/22/21 0000)  AeroChamber Plus Flo-Vu Large MISC 1 each (1 each Other Given 07/22/21 0002)  alum & mag hydroxide-simeth (MAALOX/MYLANTA) 200-200-20 MG/5ML suspension 30 mL  (30 mLs Oral Given 07/22/21 0108)    And  lidocaine (XYLOCAINE) 2 % viscous mouth solution 15 mL (15 mLs Oral Given 07/22/21 0107)  diphenhydrAMINE (BENADRYL) 12.5 MG/5ML elixir 25 mg (25 mg Oral Given 07/22/21 0230)    ED Course/ Medical Decision Making/ A&P                           Medical Decision Making Amount and/or Complexity of Data Reviewed Labs: ordered.  Risk OTC drugs. Prescription drug management.   This patient presents to the ED for concern of cough in the setting of recent COVID positivity, this involves an extensive number of treatment options, and is a complaint that carries with it a high risk of complications and morbidity.  The differential diagnosis includes bronchospasm vs post-viral bronchitis vs PNA vs pleural effusion vs PTX. Mother also expresses concern for possible UTI given intermittent c/o dysuria. No associated fever, flank pain, urgency or frequency.   Co morbidities that complicate the patient evaluation  Cerebral palsy, intellectual delay, seizure disorder, congenital quadriplegia   Additional history obtained:  Additional history obtained from mother External records from outside source obtained and reviewed including prior ED visits   Lab Tests:  I Ordered, and personally interpreted labs.  The pertinent results include:  All tests ordered WNL. UA negative for UTI. Pregnancy negative.   Cardiac Monitoring:  The patient was maintained on a cardiac monitor.  I personally viewed and interpreted the cardiac monitored which showed an underlying rhythm of: NSR   Medicines ordered and prescription drug management:  I ordered medication including albuterol inhaler, GI cocktail, Flonase for management of cough and PND. Benadryl ordered for nasal congestion Reevaluation of the patient after these medicines showed that the patient improved I have reviewed the patients home medicines and have made adjustments as needed   Test  Considered:  CXR   Reevaluation:  After the interventions noted above, I reevaluated the patient and found that they have :improved   Social Determinants of Health:  Good social support; mother at bedside   Dispostion:  After consideration of the diagnostic results and the patients response to treatment, I feel that the patent would benefit from close outpatient PCP follow up. Encouraged use of albuterol and flonase for cough management in addition to daily Zyrtec or Claritin. Mother given Rx for magic mouthwash for sore throat and cough. Patient reliable for f/u with PCP. Return precautions discussed and provided. Patient discharged in stable condition with no unaddressed concerns.         Final Clinical Impression(s) / ED Diagnoses Final diagnoses:  Acute cough  COVID-19 virus infection    Rx / DC Orders  ED Discharge Orders          Ordered    magic mouthwash (lidocaine, diphenhydrAMINE, alum & mag hydroxide) suspension  4 times daily PRN        07/22/21 0215              Antony Madura, PA-C 07/22/21 0440    Tilden Fossa, MD 07/23/21 2246

## 2021-07-21 NOTE — ED Triage Notes (Addendum)
Pt here w/ [facility] for cough after testing positive for covid on 07/15/2021, pt reports worsening cough over last 2 days. Pt's mom also reports dysuria and strong odor to urine since sunday. Hx cerebral palsy and seizures 142/80, 92HR, 20RR, 100% RA

## 2021-07-21 NOTE — ED Notes (Signed)
Pt in triage  

## 2021-07-22 LAB — PREGNANCY, URINE: Preg Test, Ur: NEGATIVE

## 2021-07-22 LAB — URINALYSIS, ROUTINE W REFLEX MICROSCOPIC
Bacteria, UA: NONE SEEN
Bilirubin Urine: NEGATIVE
Glucose, UA: NEGATIVE mg/dL
Hgb urine dipstick: NEGATIVE
Ketones, ur: NEGATIVE mg/dL
Leukocytes,Ua: NEGATIVE
Nitrite: NEGATIVE
Protein, ur: NEGATIVE mg/dL
Specific Gravity, Urine: 1.017 (ref 1.005–1.030)
pH: 7 (ref 5.0–8.0)

## 2021-07-22 MED ORDER — DIPHENHYDRAMINE HCL 12.5 MG/5ML PO ELIX
25.0000 mg | ORAL_SOLUTION | Freq: Once | ORAL | Status: AC
  Administered 2021-07-22: 25 mg via ORAL
  Filled 2021-07-22: qty 10

## 2021-07-22 MED ORDER — ALUM & MAG HYDROXIDE-SIMETH 200-200-20 MG/5ML PO SUSP
30.0000 mL | Freq: Once | ORAL | Status: AC
  Administered 2021-07-22: 30 mL via ORAL
  Filled 2021-07-22: qty 30

## 2021-07-22 MED ORDER — LIDOCAINE VISCOUS HCL 2 % MT SOLN: 5.0000 mL | Freq: Four times a day (QID) | OROMUCOSAL | 0 refills | Status: DC | PRN

## 2021-07-22 MED ORDER — FLUTICASONE PROPIONATE 50 MCG/ACT NA SUSP
2.0000 | Freq: Every day | NASAL | Status: DC
  Administered 2021-07-22: 2 via NASAL
  Filled 2021-07-22: qty 16

## 2021-07-22 MED ORDER — LIDOCAINE VISCOUS HCL 2 % MT SOLN
15.0000 mL | Freq: Once | OROMUCOSAL | Status: AC
  Administered 2021-07-22: 15 mL via ORAL
  Filled 2021-07-22: qty 15

## 2021-07-22 NOTE — Discharge Instructions (Addendum)
We recommend 2 puffs of an inhaler ever 4-6 hours for cough and shortness of breath. Use 2 sprays of Flonase in each nostril daily. You would also benefit from use of daily Zyrtec or Claritin while symptoms persist. Use magic mouthwash for sore throat and cough as prescribed, as needed. Follow up with your primary care doctor.

## 2021-07-25 ENCOUNTER — Ambulatory Visit (INDEPENDENT_AMBULATORY_CARE_PROVIDER_SITE_OTHER): Payer: Medicaid Other | Admitting: Pediatrics

## 2021-07-25 ENCOUNTER — Encounter (INDEPENDENT_AMBULATORY_CARE_PROVIDER_SITE_OTHER): Payer: Self-pay | Admitting: Pediatrics

## 2021-08-01 ENCOUNTER — Telehealth: Payer: Self-pay | Admitting: Family Medicine

## 2021-08-01 MED ORDER — ALBUTEROL SULFATE HFA 108 (90 BASE) MCG/ACT IN AERS: 2.0000 | INHALATION_SPRAY | Freq: Four times a day (QID) | RESPIRATORY_TRACT | 0 refills | Status: DC | PRN

## 2021-08-01 NOTE — Telephone Encounter (Signed)
After hours emergency call  Mother calls requesting albuterol refill.  Patient developed persistent cough after a COVID infection in 1/20 and did get evaluated in the ED and was given an albuterol inhaler which did help with her symptoms.  Cough has been worse at night.  Mother will call in the morning to schedule a appointment for her.  In the meantime albuterol inhaler was refilled.  Denies any other symptoms other than cough.

## 2021-08-01 NOTE — Telephone Encounter (Signed)
**  After Hours/ Emergency Line Call**  Received a page to call Tammee A Burek'd mom.  Mom reports Lailany was Dx with COVID on 07/15/21.   Endorsing persistent cough.  Mom ran out of magic mouth wash. Provided OTC recommendations for her presumed sore throat.   Red flags discussed.  ED precautions given. Will forward to PCP.  Katha Cabal, DO PGY-3, [facility] 08/01/2021 9:55 PM

## 2021-08-02 ENCOUNTER — Telehealth: Payer: Self-pay

## 2021-08-02 ENCOUNTER — Ambulatory Visit (INDEPENDENT_AMBULATORY_CARE_PROVIDER_SITE_OTHER): Payer: Medicaid Other | Admitting: Family Medicine

## 2021-08-02 ENCOUNTER — Other Ambulatory Visit: Payer: Self-pay

## 2021-08-02 ENCOUNTER — Encounter: Payer: Self-pay | Admitting: Family Medicine

## 2021-08-02 VITALS — BP 120/60 | HR 72 | Wt <= 1120 oz

## 2021-08-02 DIAGNOSIS — R059 Cough, unspecified: Secondary | ICD-10-CM

## 2021-08-02 MED ORDER — LIDOCAINE VISCOUS HCL 2 % MT SOLN: 5.0000 mL | Freq: Four times a day (QID) | OROMUCOSAL | 0 refills | Status: DC | PRN

## 2021-08-02 MED ORDER — FLUTICASONE PROPIONATE 50 MCG/ACT NA SUSP: 2.0000 | Freq: Every day | NASAL | 6 refills | Status: DC

## 2021-08-02 MED ORDER — FAMOTIDINE 40 MG/5ML PO SUSR: 20.0000 mg | Freq: Every day | ORAL | 3 refills | Status: DC

## 2021-08-02 NOTE — Telephone Encounter (Signed)
Pt was seen by Dr. Nobie Putnam today. Mom asked if pt's Dr.has sent the letter asking for more aid hours. I told the pt I would send a message to Dr. Salvadore Dom and have them check on the status. Sunday Spillers, CMA

## 2021-08-02 NOTE — Progress Notes (Signed)
° ° °  SUBJECTIVE:   CHIEF COMPLAINT / HPI:   Continued cough Patient presents with her mother.  She is wheelchair-bound with cerebral palsy.  Patient's mother reports that since having COVID she has had a persistent cough which usually only occurs at night.  It occurs after she lays down.  They are propping the patient up some but last night she coughed all night.  She was seen in the emergency department and given Magic mouthwash which did help with the coughing.  The mother reports that she is also been using Flonase at night just prior to going to bed.  Denies any other symptoms.  OBJECTIVE:   BP 120/60    Pulse 72    Wt 64 lb 6.4 oz (29.2 kg) Comment: Wt with mom 325.4, Mom wt is 260.6 lbs   LMP 07/19/2021 (Approximate)    SpO2 99%   General: Pleasant chronically ill appearing 32 year old female, wheelchair-bound Cardiac: Regular rate Respiratory: Normal work of breathing, no coughing appreciated   ASSESSMENT/PLAN:   Cough Patient with increased cough especially at night.  COVID most likely playing a role in this.  Also believe that patient receiving Flonase just prior to bed may be increasing postnasal drip so recommended giving Flonase in the morning rather than at night right before she lays down.  Refilled patient's Magic mouthwash.  There may be also a component of acid reflux so prescribed Pepcid daily.  Strict ED and return precautions given.  Mother has no questions or concerns     Gifford Shave, MD [place]

## 2021-08-02 NOTE — Patient Instructions (Signed)
It was good seeing you today.  It is difficult to tell what is causing this cough, it is most likely related to COVID but there also may be some other contributing factors.  I want to start her on a medication for acid reflux, Pepcid.  She will take 20 mg (2.5 mL) daily.  I also refilled the Magic mouthwash.  I would also like for you to start using Flonase to help decrease the secretions in her nose and hopefully calm her sneezing episodes down.  If you have any questions or concerns please call the clinic.  I hope you have a great day!

## 2021-08-02 NOTE — Telephone Encounter (Signed)
I have no idea.  Kristin Fitzgerald

## 2021-08-03 DIAGNOSIS — R059 Cough, unspecified: Secondary | ICD-10-CM | POA: Insufficient documentation

## 2021-08-03 NOTE — Telephone Encounter (Signed)
Dr. Cherlynn Polo Lazarus Salines,  The last note dated 02/22/21 (telephone encounter),  is this the letter to [facility] that the mother is inquiring of or is there another one?  Glennie Hawk, CMA

## 2021-08-03 NOTE — Assessment & Plan Note (Signed)
Patient with increased cough especially at night.  COVID most likely playing a role in this.  Also believe that patient receiving Flonase just prior to bed may be increasing postnasal drip so recommended giving Flonase in the morning rather than at night right before she lays down.  Refilled patient's Magic mouthwash.  There may be also a component of acid reflux so prescribed Pepcid daily.  Strict ED and return precautions given.  Mother has no questions or concerns

## 2021-08-04 NOTE — Telephone Encounter (Signed)
Called mother of patient.  States that she needs a doctor's note requesting more 8 hours.  Her sons were helping take care of her of [place] but they do need to work for financial reasons.  Mother plans to call back with fax number and I will fax a letter over when that is available.  Lavonda Jumbo, DO 08/04/2021, 5:26 PM PGY-3, [facility]

## 2021-08-05 ENCOUNTER — Encounter: Payer: Self-pay | Admitting: Family Medicine

## 2021-08-05 NOTE — Telephone Encounter (Signed)
Thanks for the update.  Glennie Hawk, CMA

## 2021-08-05 NOTE — Telephone Encounter (Signed)
Mother returns call to nurse line. Fax number for Medicaid is [phone].   Mother also states that letter needs to state that this request is for [facility] plus hours and the date of her last office visit (08/02/21)  Talbot Grumbling, RN

## 2021-08-23 IMAGING — DX DG CHEST 1V
1 series · 1 of 1 positions shown · non-contrast
Comparison: Chest x-ray dated May 13, 2020

CLINICAL DATA: Cough

EXAM:
CHEST  1 VIEW

[chest]
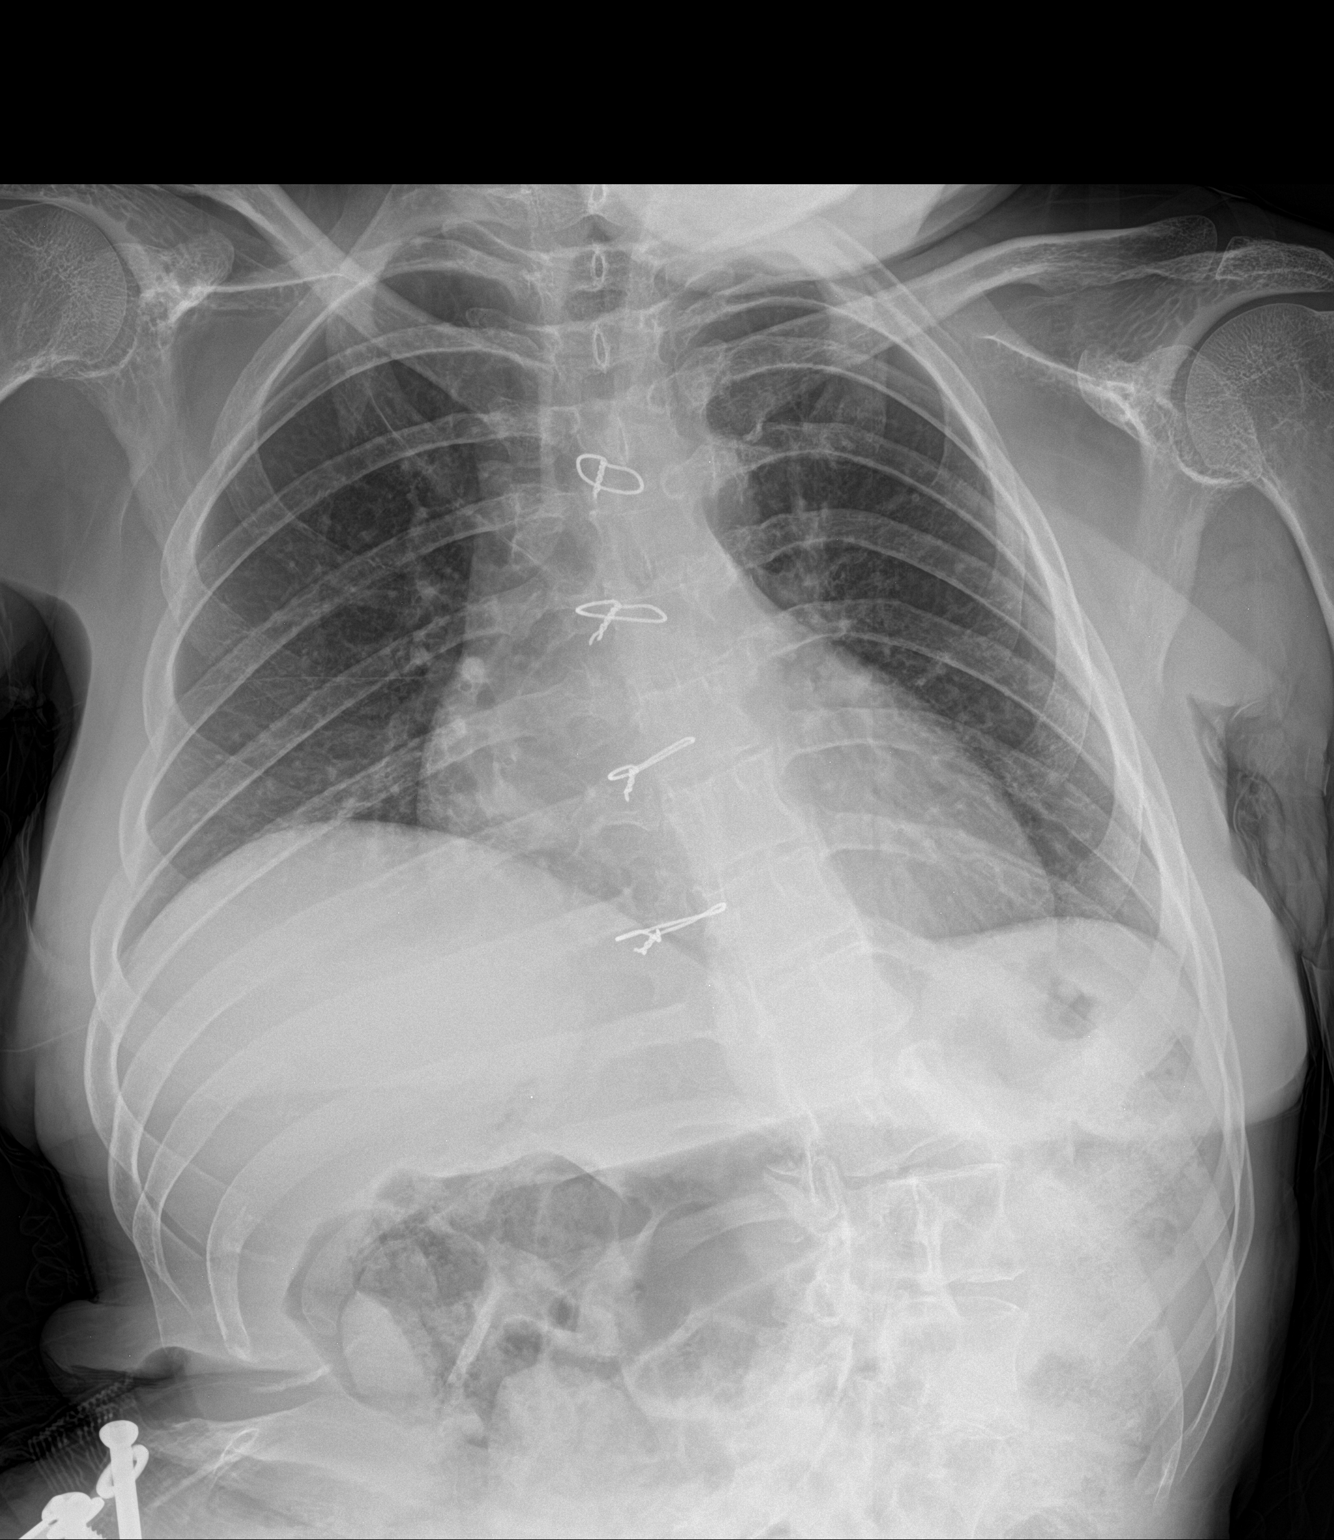

[1 of 1 positions shown; findings below may reference images not displayed]

FINDINGS: Cardiac and mediastinal contours are unchanged post median
sternotomy.

Bibasilar atelectasis. Lungs otherwise clear. No focal
consolidation. No large pleural effusion or evidence of
pneumothorax.

Severe thoracolumbar levoscoliosis.
IMPRESSION: Bibasilar atelectasis.  Lungs otherwise clear.

## 2021-09-13 ENCOUNTER — Other Ambulatory Visit (INDEPENDENT_AMBULATORY_CARE_PROVIDER_SITE_OTHER): Payer: Self-pay | Admitting: Pediatrics

## 2021-09-13 DIAGNOSIS — G40309 Generalized idiopathic epilepsy and epileptic syndromes, not intractable, without status epilepticus: Secondary | ICD-10-CM

## 2021-09-13 DIAGNOSIS — G40209 Localization-related (focal) (partial) symptomatic epilepsy and epileptic syndromes with complex partial seizures, not intractable, without status epilepticus: Secondary | ICD-10-CM

## 2021-09-13 MED ORDER — GABAPENTIN 100 MG PO CAPS: ORAL_CAPSULE | ORAL | 0 refills | Status: DC

## 2021-09-16 ENCOUNTER — Telehealth: Payer: Self-pay

## 2021-09-16 NOTE — Telephone Encounter (Signed)
Mother updated. She reports she will start with 1mg  dose of melatonin and FU as scheduled.  ?

## 2021-09-16 NOTE — Telephone Encounter (Signed)
Mother calls nurse line with concerns for sleep.  ? ?Mother reports its been harder and harder to get Ajee to fall asleep at night and stay asleep. She has been waking up irritated and drowsy from the lack of sleep the night before.  ? ?Mother would like to schedule an apt to discuss different options. Apt scheduled for 4/3 with PCP.  ? ?In the meantime mother would like start a low dose melatonin, however would like the "green light" from PCP before starting.  ?

## 2021-09-26 ENCOUNTER — Ambulatory Visit: Payer: Medicaid Other | Admitting: Family Medicine

## 2021-09-29 ENCOUNTER — Ambulatory Visit: Payer: Medicaid Other | Admitting: Family Medicine

## 2021-10-04 ENCOUNTER — Ambulatory Visit (INDEPENDENT_AMBULATORY_CARE_PROVIDER_SITE_OTHER): Payer: Medicaid Other | Admitting: Family

## 2021-10-07 ENCOUNTER — Ambulatory Visit: Payer: Medicaid Other | Admitting: Family Medicine

## 2021-10-13 ENCOUNTER — Other Ambulatory Visit (INDEPENDENT_AMBULATORY_CARE_PROVIDER_SITE_OTHER): Payer: Self-pay | Admitting: Family

## 2021-10-13 DIAGNOSIS — G40209 Localization-related (focal) (partial) symptomatic epilepsy and epileptic syndromes with complex partial seizures, not intractable, without status epilepticus: Secondary | ICD-10-CM

## 2021-10-13 DIAGNOSIS — G40309 Generalized idiopathic epilepsy and epileptic syndromes, not intractable, without status epilepticus: Secondary | ICD-10-CM

## 2021-10-20 ENCOUNTER — Ambulatory Visit (INDEPENDENT_AMBULATORY_CARE_PROVIDER_SITE_OTHER): Payer: Medicaid Other | Admitting: Family

## 2021-10-20 ENCOUNTER — Encounter (INDEPENDENT_AMBULATORY_CARE_PROVIDER_SITE_OTHER): Payer: Self-pay | Admitting: Family

## 2021-10-20 VITALS — BP 110/70 | HR 86 | Wt <= 1120 oz

## 2021-10-20 DIAGNOSIS — R634 Abnormal weight loss: Secondary | ICD-10-CM | POA: Diagnosis not present

## 2021-10-20 DIAGNOSIS — R1319 Other dysphagia: Secondary | ICD-10-CM | POA: Diagnosis not present

## 2021-10-20 DIAGNOSIS — G808 Other cerebral palsy: Secondary | ICD-10-CM | POA: Diagnosis not present

## 2021-10-20 DIAGNOSIS — G40209 Localization-related (focal) (partial) symptomatic epilepsy and epileptic syndromes with complex partial seizures, not intractable, without status epilepticus: Secondary | ICD-10-CM

## 2021-10-20 DIAGNOSIS — E639 Nutritional deficiency, unspecified: Secondary | ICD-10-CM

## 2021-10-20 DIAGNOSIS — G40309 Generalized idiopathic epilepsy and epileptic syndromes, not intractable, without status epilepticus: Secondary | ICD-10-CM

## 2021-10-20 MED ORDER — CARBAMAZEPINE ER 200 MG PO CP12: 200.0000 mg | ORAL_CAPSULE | Freq: Two times a day (BID) | ORAL | 5 refills | Status: DC

## 2021-10-20 MED ORDER — GABAPENTIN 100 MG PO CAPS: ORAL_CAPSULE | ORAL | 5 refills | Status: DC

## 2021-10-20 NOTE — Progress Notes (Signed)
? ?Medical Nutrition Therapy - Initial Assessment ?Appt start time: 11:35 AM ?Appt end time: 12:13 PM ?Reason for referral: Loss of weight; congenital quadriplegia ?Referring provider: Elveria Rising, NP - Neuro ?Pertinent medical hx: epilepsy, congenital quadriplegia, cerebral palsy, scoliosis, mild intellectual disability, adjustment disorder with emotional disturbance, constipation, dysphagia, impaired nutrition d/t limited access to healthful foods, unintended weight loss ? ?Assessment: ?Food allergies: none ?Pertinent Medications: see medication list - pepcid ?Vitamins/Supplements: none ?Pertinent labs: Labs related to recent hospitalization and likely not indicative of nutritional status ?(1/20) CBC: WBC - 3.1 (low), Hemoglobin - 11.8 (low), HCT - 35.6 (low) ?(1/20) CMP: Sodium - 133 (low), Potassium - 3.4 (low), Glucose - 133 (high) ? ?(5/4) Anthropometrics: ?Ht: 136.8 cm ?Wt: 31.2 kg ?BMI: 16.9  ?IBW based on [place]wi Equation: 31.8 kg (+/-10%) ? ?10/20/21 Wt: 70# ?08/02/21 Wt: 64.5 # ?07/17/21 Wt: 72# ?08/11/20 Wt: 72#  ?05/13/20 Wt: 95#  ?08/05/19 Wt: 95#  ?03/07/18 Wt: 105#  ? ?Estimated minimum caloric needs: 1400 kcal/day (EER) ?Estimated minimum protein needs: 0.8-1.0 g/kg/day (DRI) ?Estimated minimum fluid needs: 1400 mL/day (1 mL/kcal) ? ?Primary concerns today: Consult given pt with loss of weight and congenital quadriplegia. Mom accompanied pt to appt today.  ? ?Dietary Intake Hx: ?DME: [employer] [facility] ?Usual eating pattern includes: 2-3 meals and 1-2 snacks per day. Mom notes on the weekend she is able to eat breakfast, but during the week it's hard to get breakfast in her.  ?Meal duration: <30 minutes  ?Everyone served same meal: yes  ?Family meals: yes ? ?Chewing/swallowing difficulties with foods or liquids: none  ?Texture modifications: pureed  ? ?24-hr recall: ?Breakfast/Lunch (11:30 AM): 2 packets of oatmeal + 1 pudding/applesauce + 1 capri juice ?Snack: cotton candy OR applesauce OR pudding ?Dinner:  hamburger w/ gravy OR chicken and rice (protein + starch)  ? ?Typical Beverages: water (~4 oz), capri sun (4 per day), hawaiian punch, apple juice (8 oz bottle) ?Nutrition Supplements: Chocolate Boost Plus (1x/day) ? ?Notes: Per mom, Mckenna is a good eater, but doesn't love vegetables (mom tries to sneak them into foods). Mom feels Maanya does better eating for her brothers and aid than for mom. Mom was previous buying Boost Plus for Lacrisha and was giving them to her once per day, but is unable to continue purchasing given expense. Mom notes concern for food insecurity. Mom is concerned for Jlyn's weight as she feels her usual body weight is between 90-100#. ? ?Physical Activity: wheelchair-bound ? ?GI: no concern ?GU: no concern (pale urine and frequent)  ? ?Estimated intake likely not meeting needs given underweight status.  ?Pt consuming various food groups.  ?Pt consuming inadequate amounts of fruits, vegetables and dairy.  ? ?Nutrition Diagnosis: ?(5/4) Underweight related to inadequate energy intake as evidenced by BMI <18.5 and pt dependent on nutritional supplementation to meet nutrition needs.  ? ?Intervention: ?Discussed pt's growth and current regimen. Discussed ways to save money with groceries and how to optimize intake. Mom notes she has not heard from [employer] for Boost despite Elveria Rising sending order on 4/27. RD will reach out to [employer] to discuss. Discussed recommendations below. All questions answered, family in agreement with plan.  ? ?Nutrition Recommendations: ?- Offer a high calorie, nutritious beverage with each meal (whole milk, chocolate milk, ensure, etc) and offer water in between.  ?- Feel free to add Boost to [employer]. Pour into oatmeal, make a milkshake with it, etc.  ?- Goal for 2 nutrition supplement per day.  ?- Limit  sugary beverages such as juice to no more than 4-8 oz per day to prevent filling up on sugar calories and rather prioritize nutritious  calories.  ?- Increase calories where able. Add 1 tsp of oil or butter to [place]'s foods. Incorporate nuts, seeds, nut butter, avocado, cheese, etc when possible.  ?- Check out [employer] for blenders (Nutribullet is a good one).  ?- Try buying canned fruits, vegetables, meats. Just be sure to rinse off extra sugar and salt with water.  ?- Blend fruits with milk, applesauce, boost, yogurt. Blend vegetables and meat with gravy, broth, stock.  ? ?Handouts Given: ?- GG High Calorie, High Protein Foods ? ?Teach back method used. ? ?Monitoring/Evaluation: ?Continue to Monitor: ?- Weight trends  ?- PO intake  ?- Nutrition supplement acceptance ? ?Follow-up in 3 months. ? ?Total time spent in counseling: 38 minutes.  ?

## 2021-10-20 NOTE — Progress Notes (Signed)
? ?Quentin Mulling   ?MRN:  [id number]  ?[birth year]  ? ?Provider: Rockwell Germany NP-C ?Location of Care: [facility] ? ?Visit type: follow up  ? ?Last visit: 08/11/2020 ? ?Referral source: Gerlene Fee, DO  ?History from: Epic chart and patient's mother ? 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 She can have oppositional behavior but that has improved over time. 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 ?  ?Seizures have been well controlled for years on the combination of Carbatrol and gabapentin which he tolerates without side effects.  She is totally dependent on others for dressing and bathing changing her diaper, is unable to feed herself or prepare meals.  She has 76 CAPS hours per week.  Her mother works in childcare. ? ?Today's concerns: ?Mom reports that Kennyth Lose has remained seizure free since her last visit. She has lost weight over time. Mom says that she consumes a pureed diet and that she gives her 1 Boost drink per day. She had Covid in January and lost down to 62 lbs. She has gained some weight back since then. Mom says that Kennyth Lose usually gets 3 meals per day but that sometimes she will refuse lunch. She can be oppositional at times.  ? ?Kennyth Lose has been otherwise generally healthy since she was last seen. Mom has no other health concerns for her today other than previously mentioned. ? ?Review of systems: ?Please see HPI for neurologic and other pertinent review of systems. Otherwise all other systems were reviewed and were negative. ? ?Problem List: ?Patient Active Problem List  ? Diagnosis Date Noted  ? Cough 08/03/2021   ? Unintended weight loss 08/11/2020  ? COVID-19 vaccine dose not administered 08/04/2020  ? Adjustment disorder with emotional disturbance 08/05/2019  ? Need for immunization against influenza 05/07/2017  ? Nutrition impaired due to limited access to healthful foods 05/07/2017  ? Thrush 07/09/2016  ? Constipation 07/09/2016  ? Dysphagia 10/25/2015  ? 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  ? 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 Partial epilepsy with impairment of consciousness ([place]) 05/19/2013  ? Congenital quadriplegia ([place]) 05/19/2013  ? Scoliosis associated with other condition 05/19/2013  ? Unspecified disorder of eye movements 05/19/2013  ? Mild intellectual disability 05/19/2013  ? Unspecified urinary incontinence 05/19/2013  ? Unspecified congenital cataract 05/19/2013  ? Cerebral palsy ([place]) 07/06/2011  ? Seizure disorder ([place]) 07/06/2011  ? Heart murmur 07/06/2011  ? Healthcare maintenance 07/06/2011  ?  ? ?Past Medical History:  ?Diagnosis Date  ? Cerebral palsy ([place])   ? Mild intellectual disabilities   ? Seizure disorder ([place])   ? Seizures ([place])   ?  ?Past medical history comments: See HPI ?Copied from previous record: ?Birth History ?3 lbs. 1 oz. Infant born at [redacted] weeks gestational age   ?Gestation was complicated by premature delivery for unknown reasons. ?Nursery Course was complicated by seizures a day 3 of life.  The patient had no hemorrhage but had periventricular leukomalacia. ?Growth and Development was recalled as abnormal ? ?Surgical history: ?Past Surgical History:  ?Procedure Laterality Date  ? CARDIAC SURGERY    ? Repaired valve in 2007-[place]  ? EYE SURGERY    ?   bilateral eye surgery in infancy  ? HIP  ARTHROPLASTY    ? for recurrent dislocation; rebuilt acetabulum  ? INGUINAL HERNIA REPAIR    ? KIDNEY SURGERY    ?  kidney/bladder surgery in infancy for "blockage"  ? OTHER SURGICAL HISTORY    ? [place] Surgery as an infant  ?  ? ?Family  history: ?family history includes Asthma in her brother; Breast cancer in her maternal aunt; Kidney failure in her maternal grandmother; Leukemia in her maternal grandfather.  ? ?Social history: ?Social History  ? ?Socioeconomic History  ? Marital status: Single  ?  Spouse name: Not on file  ? Number of children: Not on file  ? Years of education: Not on file  ? Highest education level: Not on file  ?Occupational History  ? Not on file  ?Tobacco Use  ? Smoking status: Never  ?  Passive exposure: Yes  ? Smokeless tobacco: Never  ? Tobacco comments:  ?  Mother and brother smoke   ?Substance and Sexual Activity  ? Alcohol use: No  ?  Alcohol/week: 0.0 standard drinks  ? Drug use: No  ? Sexual activity: Never  ?Other Topics Concern  ? Not on file  ?Social History Narrative  ? 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  ? She lives with her mother and her brother Berna Spare.  ? ?Social Determinants of Health  ? ?Financial Resource Strain: Not on file  ?Food Insecurity: Not on file  ?Transportation Needs: Not on file  ?Physical Activity: Not on file  ?Stress: Not on file  ?Social Connections: Not on file  ?Intimate Partner Violence: Not on file  ?  ?Past/failed meds: ? ? ?Allergies: ?Allergies  ?Allergen Reactions  ? Demerol Nausea And Vomiting  ?  ? ?Immunizations: ?Immunization History  ?Administered Date(s) Administered  ? Hepatitis B 07/06/2011, 11/07/2011  ? Influenza Split 07/06/2011, 05/31/2012  ? Influenza,inj,Quad PF,6+ Mos 04/16/2013, 04/21/2014, 06/07/2015, 07/07/2016, 05/07/2017, 04/30/2018, 03/05/2019, 05/12/2020  ?  ? 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 ?  ?Physical Exam: ?BP 110/70   Pulse 86   Wt 69 lb 9.6 oz (31.6 kg)   ?Wt Readings from Last 3 Encounters:  ?10/20/21 69 lb 9.6 oz (31.6 kg)  ?08/02/21 64 lb 6.4 oz (29.2 kg)  ?07/17/21 71 lb 13.9 oz (32.6 kg)  ?  ?General:  well developed, well nourished woman, seated in wheelchair, in no evident distress ?Head: normocephalic and atraumatic. Oropharynx benign. No dysmorphic features. ?Neck: supple ?Cardiovascular: regular rate and rhythm, no murmurs. ?Respiratory: clear to auscultation bilaterally ?Abdomen: bowel sounds present all four quadrants, abdomen soft, non-tender, non-distended. No hepatosplenomegaly or masses palpated. ?Musculoskeletal: no skeletal deformities. Has contractures at the right elbow, both knees, and tight heel cords. Has severe convex left neuromuscular scoliosis. Has right hemiatrophy upper extremity greater than lower ?Skin: no rashes or neurocutaneous lesions ? ?Neurologic Exam ?Mental Status: awake and fully alert. Has limited language. Knowledge is below normal for age. She has dysarthria but is intelligible. Able to follow some very simple commands ?Cranial Nerves: fundoscopic exam - red reflex present.  Unable to fully visualize fundus.  Pupils equal briskly reactive to light.  Turns to localize faces and objects in the periphery. Turns to localize sounds in the periphery. Facial movements are symmetric ?Motor: spastic quadriparesis. Poor fine motor movements. ?Sensory: withdrawal x 4 ?Coordination: unable to adequately assess due to patient's inability to participate in examination. No dysmetria when reaching  for objects. ?Gait and Station: unable to independently stand and bear weight. ?Reflexes: diminished and symmetric. Toes neutral. No clonus  ? ?Impression: ?Loss of weight - Plan: Amb referral to Ped Nutrition & Diet ? ?Congenital quadriplegia ([place]) - Plan: Amb referral to Ped Nutrition & Diet, AMB REFERRAL FOR DME ? ?Esophageal dysphagia - Plan: AMB REFERRAL FOR DME ? ?Nutrition impaired due to limited access to healthful foods ? ?Unintended weight loss - Plan: AMB REFERRAL FOR DME ? ?Impaired nutrition - Plan: AMB REFERRAL FOR DME ? ?Partial epilepsy with impairment of consciousness ([place]) - Plan:  carbamazepine (CARBATROL) 200 MG 12 hr capsule, gabapentin (NEURONTIN) 100 MG capsule ? ?Generalized convulsive epilepsy ([place]) - Plan: carbamazepine (CARBATROL) 200 MG 12 hr capsule, gabapentin (NEURONTIN) 10

## 2021-10-21 ENCOUNTER — Encounter (INDEPENDENT_AMBULATORY_CARE_PROVIDER_SITE_OTHER): Payer: Self-pay | Admitting: Family

## 2021-10-21 NOTE — Patient Instructions (Signed)
It was a pleasure to see you today! ? ?Instructions for you until your next appointment are as follows: ?I will send an order to [facility] for Kristin Fitzgerald to receive 2 Boost drinks per day because of her weight loss ?I will refer you to the dietician in this office for help with her nutrition. ?Continue Kristin Fitzgerald's medications as prescribed. ?Let me know if she has any seizures or if you have any other concerns. ?Please sign up for MyChart if you have not done so. ?Please plan to return for follow up in 6 months or sooner if needed. ? ?Feel free to contact our office during normal business hours at [phone] with questions or concerns. If there is no answer or the call is outside business hours, please leave a message and our clinic staff will call you back within the next business day.  If you have an urgent concern, please stay on the line for our after-hours answering service and ask for the on-call neurologist.   ?  ?I also encourage you to use MyChart to communicate with me more directly. If you have not yet signed up for MyChart within [facility], the front desk staff can help you. However, please note that this inbox is NOT monitored on nights or weekends, and response can take up to 2 business days.  Urgent matters should be discussed with the on-call pediatric neurologist.  ? ?At [facility], we are committed to providing exceptional care. You will receive a patient satisfaction survey through text or email regarding your visit today. Your opinion is important to me. Comments are appreciated.   ?

## 2021-10-27 ENCOUNTER — Ambulatory Visit (INDEPENDENT_AMBULATORY_CARE_PROVIDER_SITE_OTHER): Payer: Medicaid Other | Admitting: Dietician

## 2021-10-27 DIAGNOSIS — R636 Underweight: Secondary | ICD-10-CM

## 2021-10-27 DIAGNOSIS — R1310 Dysphagia, unspecified: Secondary | ICD-10-CM

## 2021-10-27 DIAGNOSIS — E639 Nutritional deficiency, unspecified: Secondary | ICD-10-CM

## 2021-10-27 DIAGNOSIS — R634 Abnormal weight loss: Secondary | ICD-10-CM

## 2021-10-27 NOTE — Patient Instructions (Signed)
Nutrition Recommendations: ?- Offer a high calorie, nutritious beverage with each meal (whole milk, chocolate milk, ensure, etc) and offer water in between.  ?- Feel free to add Boost to [employer]. Pour into oatmeal, make a milkshake with it, etc.  ?- Goal for 2 nutrition supplement per day.  ?- Limit sugary beverages such as juice to no more than 4-8 oz per day to prevent filling up on sugar calories and rather prioritize nutritious calories.  ?- Increase calories where able. Add 1 tsp of oil or butter to [place]'s foods. Incorporate nuts, seeds, nut butter, avocado, cheese, etc when possible.  ?- Check out [employer] for blenders (Nutribullet is a good one).  ?- Try buying canned fruits, vegetables, meats. Just be sure to rinse off extra sugar and salt with water.  ?- Blend fruits with milk, applesauce, boost, yogurt. Blend vegetables and meat with gravy, broth, stock.  ?

## 2021-10-28 ENCOUNTER — Ambulatory Visit: Payer: Medicaid Other | Admitting: Family Medicine

## 2021-11-01 ENCOUNTER — Encounter (INDEPENDENT_AMBULATORY_CARE_PROVIDER_SITE_OTHER): Payer: Self-pay

## 2021-11-03 NOTE — Telephone Encounter (Signed)
RD spoke with mom about [facility] being willing to cover Boost even though Brienne is over 33 years old. Mom in agreement to switch DME companies. RD will work through needed paperwork to get [place] transitioned to [place] from [employer].  ?

## 2021-11-20 ENCOUNTER — Other Ambulatory Visit: Payer: Self-pay | Admitting: Family Medicine

## 2021-11-29 ENCOUNTER — Encounter: Payer: Self-pay | Admitting: *Deleted

## 2021-12-09 ENCOUNTER — Encounter: Payer: Self-pay | Admitting: Family Medicine

## 2021-12-09 ENCOUNTER — Telehealth: Payer: Self-pay | Admitting: *Deleted

## 2021-12-09 ENCOUNTER — Ambulatory Visit (INDEPENDENT_AMBULATORY_CARE_PROVIDER_SITE_OTHER): Payer: Medicaid Other | Admitting: Family Medicine

## 2021-12-09 VITALS — BP 141/66 | HR 85 | Temp 98.2°F | Ht <= 58 in

## 2021-12-09 DIAGNOSIS — G800 Spastic quadriplegic cerebral palsy: Secondary | ICD-10-CM

## 2021-12-09 DIAGNOSIS — Z636 Dependent relative needing care at home: Secondary | ICD-10-CM | POA: Diagnosis present

## 2021-12-09 DIAGNOSIS — G4700 Insomnia, unspecified: Secondary | ICD-10-CM | POA: Diagnosis not present

## 2021-12-09 MED ORDER — MELATONIN 1 MG/ML PO LIQD: 1.0000 mg | Freq: Every evening | ORAL | 1 refills | Status: DC

## 2021-12-09 NOTE — Patient Instructions (Addendum)
It was wonderful to see you today.  Please bring ALL of your medications with you to every visit.   Today we talked about:  Caregiver stress and desiring more sleep. I have referred you to our community care team they will give you a call by next week. Below are some therapy resources that you can attempt to utilize prior to that. I have prescribed liquid melatonin to help with sleep Follow-up in 2 weeks for a check in  Please be sure to schedule follow up at the front  desk before you leave today.    Please call the clinic at [phone] if your symptoms worsen or you have any concerns. It was our pleasure to serve you.  Dr. Salvadore Dom   Therapy and Counseling Resources Most providers on this list will take Medicaid. Patients with commercial insurance or Medicare should contact their insurance company to get a list of in network providers.  [facility] (spanish speaking therapist available)(habla espanol)(take medicare and medicaid)  [address], [place], [place] [address], [email or web address] [email or web address] [phone]  [facility]:Psychiatry and Counseling [address] [facility] [address] [place], [place] [address] [phone]  [facility]   [address], Suite [place], [place] [address]      [phone]  [facility] (spanish available) [address]  [place], [place] [address] [phone]  [facility] (take [facility] and medicare) [address]  [place], [place] [address]       [phone]     [facility] (virtual only) [phone]  Jovita Kussmaul [facility] [address], [place], [place] [phone]  [facility]:  [address] [place] [phone]  [facility]:  [address] [place] [place]iener [phone]  [facility] (under & uninsured) [address]   [place] [phone]    [email or web address]    [facility] [address]  [place]    [phone]  [facility] -[address]     Phone:  [phone]     [facility]  [address]  [phone]   [facility] [address]      [place], [place] [phone]  [facility]: [address] [place], [place], [place]  [phone]   [facility] (Spanish therapist) [email or web address]  [address] [place]  [address]   [place] [address]    [phone]    [facility]   [address],  [place], [place]  [phone]   [facility]  [address] [place]  [phone]  [facility]  [address] [place], [place]        [phone]  Open Access/Walk In Clinic under & uninsured  [facility]  [address] [place], [place] [phone] Crisis [phone]  [facility] [place],  (Spanish)   [address], [place]: [phone]) 8:30 - 12; 1 - 2:30  [facility] [employer],  [address] [place], [place]    [phone]):8:30 - 12; 2 - 3PM  [facility] [employer],  [address]  [place]; [phone]):   Mon - Fri 8 AM - 5 PM  [facility] [address] [place]  MWF 12:30 to 3:00 or call to schedule an appointment  [phone]  Specific  Provider options Psychology Today  [email or web address].com/us click on find a therapist  enter your zip code left side and select or tailor a therapist for your specific need.   [facility] Provider Directory [email or web address]  (Medicaid)   Follow all drop down to find a provider  Social Support program [facility] [place] [phone] or PhotoSolver.pl [address], [place], [place] Recovery support and educational   24- Hour Availability:   [facility]  [address] [place], [place] [phone] Crisis [phone]  [facility] [phone]  [facility]  [phone]   [facility]  [phone] (after hours)  Therapeutic Alternative/Mobile Crisis   [phone]  [place] National Suicide Hotline  [phone] Len Childs)  Call 911 or go to emergency room  [facility]  [phone]);  [place] and [employer]  [phone]); [place], [place], [place], [place], [place], [place], [place]

## 2021-12-09 NOTE — Progress Notes (Unsigned)
    SUBJECTIVE:   CHIEF COMPLAINT / HPI:   Caregiver concern The mother of Luvada (her guardian) asked to speak to me separately. She expressed that Keaghan is starting to have outburst that are verbal and physical. The mother states the patient has not hit her but she will throw things or flail her body. The patient states she is doing well and has no concerns but admits she is not sleeping well.  -x1 month -talking about a brother that is not in her life -does well with home aid but seems to be mean with family -Not able to do fun activities during the day -Requires 24 hour care with ADLs and HH only for 8 hours; family helping out but desires more so they can work and provide financially for her  Insomnia -x1 month -Stays up all night on her phone -She likes to watch music videos -Admits to daytime sleepiness -Unable to take melatonin tablets that was previously prescribed; needs liquid form  PERTINENT  PMH / PSH: Cerebral palsy, Wheelchair bound  OBJECTIVE:   BP (!) 141/66 Comment: patient in wheel chair  Pulse 85   Temp 98.2 F (36.8 C)   Ht 4\' 5"  (1.346 m)   BMI 17.52 kg/m   Physical Exam Vitals reviewed.  Constitutional:      General: She is not in acute distress.    Appearance: She is not ill-appearing, toxic-appearing or diaphoretic.     Comments: In personal portable wheelchair  Eyes:     Conjunctiva/sclera: Conjunctivae normal.  Cardiovascular:     Rate and Rhythm: Normal rate and regular rhythm.     Heart sounds: Normal heart sounds.  Pulmonary:     Effort: Pulmonary effort is normal.     Breath sounds: Normal breath sounds.  Neurological:     Mental Status: She is alert and oriented to person, place, and time.  Psychiatric:        Mood and Affect: Mood normal.        Behavior: Behavior normal.     Comments: Scrolling on youtube during most of the visit   ASSESSMENT/PLAN:   1. Caregiver stress Desires more hours of care. - Therapy resources  given - AMB Referral to [facility] Coordinaton - Check in, in 2 weeks  2. Spastic quadriplegic cerebral palsy (HCC) - AMB Referral to [facility] Coordinaton - Sees neurology regularly  3. Insomnia, unspecified type Discourage phone use 1 hour prior to bed. Likely behavioral and impacting mood and daily activities - Melatonin 1 MG/ML LIQD; Take 1 mg by mouth at bedtime.  Dispense: 58 mL; Refill: 1  [facility], DO [facility]

## 2021-12-09 NOTE — Chronic Care Management (AMB) (Signed)
  Care Management   Note  12/09/2021 Name: Kristin Fitzgerald MRN: [id number] DOB: [birth year]  [name] is a 31 y.o. year old female who is a primary care patient of Autry-Lott, [name], DO. I reached out to [employer] by phone today offer care coordination services.   [name] was given information about care management services today including:  Care management services include personalized support from designated clinical staff supervised by her physician, including individualized plan of care and coordination with other care providers 24/7 contact phone numbers for assistance for urgent and routine care needs. The patient may stop care management services at any time by phone call to the office staff.  Patient agreed to services and verbal consent obtained.   Follow up plan: Telephone appointment with care management team member scheduled for: 12/13/2021 and 12/21/2021  Burman Nieves, CCMA Care Guide, Embedded Care Coordination [facility] Health  Care Management  Direct Dial: [phone]

## 2021-12-12 DIAGNOSIS — G4700 Insomnia, unspecified: Secondary | ICD-10-CM | POA: Insufficient documentation

## 2021-12-12 DIAGNOSIS — Z636 Dependent relative needing care at home: Secondary | ICD-10-CM | POA: Insufficient documentation

## 2021-12-12 NOTE — Assessment & Plan Note (Signed)
Require 24 hour care. Receiving 8 hours home health. Desires more. Increased stressed to due knew behavior concerns likely due to lack of sleep. Needs resources and therapy resources.  - AMB Referral to [facility] Coordinaton

## 2021-12-13 ENCOUNTER — Ambulatory Visit: Payer: Medicaid Other

## 2021-12-13 NOTE — Chronic Care Management (AMB) (Signed)
   RNCM Care Management Note 12/13/2021 Name: MITHRA SPANO MRN: [id number] DOB: 03-Dec-1990   [name] is a 31 y.o. year old female who sees Autry-Lott, [place], DO for primary care. RNCM was consulted  to assistance patient with  Care Coordination.     Engaged with patient by telephone for initial visit in response to provider referral for Care Coorination.  Summary: In our conversation with Mrs. Austin, I described my role and co-worker positions as Care Coordination. She mentioned that she and her daughter reside together in their home and that she has no health concerns that require my attention. However, she expressed a need for more hours for her PCS services and mentioned that they are currently working through the process of obtaining Caps. Mrs. Kadel feels stressed and overwhelmed, as her daughter has trouble sleeping and exhibits behavioral issues. Additionally, she needs to work to cover their bills. I recommended that Christen Butter, our Social Worker, could assist her with counseling and that she will be reaching out to her on the 28th of next week..   Intervention: The patient may benefit from The patient will follow up with the the Social worker for needs.  Follow up Plan: No follow up scheduled with CM RN at this time     SDOH (Social Determinants of Health) screening and interventions performed today: No       No Care Plan  Established during this encounter     Juanell Fairly RN, BSN, [facility] Care Management Coordinator [facility] Phone: [phone]

## 2021-12-19 ENCOUNTER — Ambulatory Visit: Payer: Medicaid Other | Admitting: Family Medicine

## 2021-12-21 ENCOUNTER — Ambulatory Visit: Payer: Medicaid Other | Admitting: Licensed Clinical Social Worker

## 2021-12-21 ENCOUNTER — Other Ambulatory Visit: Payer: Self-pay | Admitting: Family Medicine

## 2021-12-21 NOTE — Chronic Care Management (AMB) (Signed)
  Care Management   Social Work Visit Note  12/21/2021 Name: Kristin Fitzgerald MRN: [id number] DOB: 1991/01/17  Kristin Fitzgerald is a 31 y.o. year old female who sees Autry-Lott, [place], DO for primary care. The care management team was consulted for assistance with care management and care coordination needs related to  Initial Outreach    Patient was given the following information about care management and care coordination services today, agreed to services, and gave verbal consent: 1.care management/care coordination services include personalized support from designated clinical staff supervised by their physician, including individualized plan of care and coordination with other care providers 2. 24/7 contact phone numbers for assistance for urgent and routine care needs. 3. The patient may stop care management/care coordination services at any time by phone call to the office staff.  Engaged with patient by telephone for initial visit in response to provider referral for social work chronic care management and care coordination services.  Assessment: Review of patient history, allergies, and health status during evaluation of patient need for care management/care coordination services.    Interventions:  Patient interviewed and appropriate assessments performed Collaborated with clinical team regarding patient needs  Successful outreach to patients mother and father. Patient mother is currently hospitalized and SW was able to briefly speak with the father. SW completed SDOH and needs assessment. No current needs at this time. SW advised if needed in the future, for patient to reach out.  SDOH (Social Determinants of Health) assessments performed: Yes     Plan:  No further follow up at this time.  Kristin Fitzgerald , MSW Social Worker [facility] Care Management  [phone]

## 2021-12-21 NOTE — Patient Instructions (Signed)
Visit Information  Instructions:   Patient was given the following information about care management and care coordination services today, agreed to services, and gave verbal consent: 1.care management/care coordination services include personalized support from designated clinical staff supervised by their physician, including individualized plan of care and coordination with other care providers 2. 24/7 contact phone numbers for assistance for urgent and routine care needs. 3. The patient may stop care management/care coordination services at any time by phone call to the office staff.  Patient verbalizes understanding of instructions and care plan provided today and agrees to view in MyChart. Active MyChart status and patient understanding of how to access instructions and care plan via MyChart confirmed with patient.     No further follow up required: .  Karina Nofsinger, BSW , MSW Social Worker [facility] Care Management  [phone]      

## 2022-02-02 ENCOUNTER — Ambulatory Visit (INDEPENDENT_AMBULATORY_CARE_PROVIDER_SITE_OTHER): Payer: Medicaid Other | Admitting: Dietician

## 2022-02-07 ENCOUNTER — Ambulatory Visit (INDEPENDENT_AMBULATORY_CARE_PROVIDER_SITE_OTHER): Payer: Medicaid Other | Admitting: Dietician

## 2022-03-09 ENCOUNTER — Ambulatory Visit (INDEPENDENT_AMBULATORY_CARE_PROVIDER_SITE_OTHER): Payer: Medicaid Other | Admitting: Dietician

## 2022-03-09 ENCOUNTER — Telehealth (INDEPENDENT_AMBULATORY_CARE_PROVIDER_SITE_OTHER): Payer: Self-pay | Admitting: Family

## 2022-03-09 DIAGNOSIS — G800 Spastic quadriplegic cerebral palsy: Secondary | ICD-10-CM

## 2022-03-09 DIAGNOSIS — R159 Full incontinence of feces: Secondary | ICD-10-CM

## 2022-03-09 DIAGNOSIS — R32 Unspecified urinary incontinence: Secondary | ICD-10-CM

## 2022-03-09 NOTE — Telephone Encounter (Signed)
Order signed. TG

## 2022-03-09 NOTE — Progress Notes (Signed)
Medical Nutrition Therapy - Progress Note Appt start time: 8:33 AM Appt end time: 9:00 AM Reason for referral: Loss of weight; congenital quadriplegia Referring provider: Elveria Rising, NP - Neuro Pertinent medical hx: epilepsy, congenital quadriplegia, cerebral palsy, scoliosis, mild intellectual disability, adjustment disorder with emotional disturbance, constipation, dysphagia, impaired nutrition d/t limited access to healthful foods, unintended weight loss  Assessment: Food allergies: none Pertinent Medications: see medication list - pepcid Vitamins/Supplements: none Pertinent labs: Labs related to recent hospitalization and likely not indicative of nutritional status (1/20) CBC: WBC - 3.1 (low), Hemoglobin - 11.8 (low), HCT - 35.6 (low) (1/20) CMP: Sodium - 133 (low), Potassium - 3.4 (low), Glucose - 133 (high)  (9/21) Anthropometrics: Ht: 136.8 cm Wt: 31.4 kg BMI: 17.1  IBW based on Hamwi Equation: 31.8 kg (+/-10%)  (5/4) Anthropometrics: Ht: 136.8 cm Wt: 31.2 kg BMI: 16.9  IBW based on Hamwi Equation: 31.8 kg (+/-10%)  10/27/21 Wt: 70 # 10/20/21 Wt: 70# 08/02/21 Wt: 64.5 # 07/17/21 Wt: 72# 08/11/20 Wt: 72#  05/13/20 Wt: 95#  08/05/19 Wt: 95#  03/07/18 Wt: 105#   Estimated minimum caloric needs: 1400 kcal/day (EER) Estimated minimum protein needs: 0.8-1.0 g/kg/day (DRI) Estimated minimum fluid needs: 1400 mL/day (1 mL/kcal)  Primary concerns today: Follow-up given pt with loss of weight and congenital quadriplegia. Mom accompanied pt to appt today.   Dietary Intake Hx: DME: [facility] Usual eating pattern includes: 2-3 meals and 1-2 snacks per day. Mom notes on the weekend she is able to eat breakfast, but during the week it's hard to get breakfast in her as she enjoys sleeping in. Meal duration: <30 minutes  Everyone served same meal: yes  Family meals: yes  Chewing/swallowing difficulties with foods or liquids: none  Texture modifications: pureed   24-hr  recall: Breakfast/Lunch (11:30 AM): 2 packets of oatmeal + 1 pudding/applesauce + Boost High Protein Snack (3 PM): applesauce OR pudding Dinner: (6-7 PM): hamburger w/ gravy + cabbage/carrots OR chicken and rice (protein + starch + veggies) + Boost High Protein  Typical Snacks: applesauce, pudding, yogurt  Typical Beverages: water (~4 oz), hawaiian punch (rarely), capri sun apple juice (3 juice boxes)  Nutrition Supplements: Chocolate Boost High Protein (1-2x/day)   Notes: Mom is concerned that [place] lost weight as mom feels she's been eating better since our last visit. Syan does continue having a hard time eating prior to 11:30-12 PM as she enjoys sleeping in -- mom feels Jeffrey's weight would improve if she was able to consistently receive 3 meals per day. Mamta is consistently receiving 1 Boost High Protein daily and is receiving 2 a few times per week if she is able to drink it with her dinner.  Physical Activity: wheelchair-bound  GI: no concern GU: no concern (pale urine and frequent)   Estimated intake likely not meeting needs given underweight status.  Pt consuming various food groups.  Pt consuming inadequate amounts of vegetables and dairy.   Nutrition Diagnosis: (5/4) Underweight related to inadequate energy intake as evidenced by BMI <18.5 and pt dependent on nutritional supplementation to meet nutrition needs.   Intervention: Discussed pt's growth and current regimen. Discussed option for switching to higher calorie nutritional supplement to aid in weight gain. Discussed incorporating bedtime snack to aid in weight gain, given inability to consume 3 meals per day. Reviewed ways to incorporate more calories into West[place]'s diet. Discussed recommendations below. All questions answered, family in agreement with plan.   Nutrition Recommendations: - Goal for 2 nutrition supplement  per day on most days.  - Let's work on having a boost when [place] wakes up  as part of her breakfast and then have another as an evening snack after dinner.  - Limit sugary beverages such as juice to no more than 4-8 oz per day to prevent filling up on sugar calories and rather prioritize nutritious calories.  - Purchase full fat dairy for [place] and remember to add in butter, oil, heavy whipping cream, gravies, etc to add in extra calories.  - I will put in an order with [facility] for a higher calorie nutrition supplement -- Boost plus chocolate flavor only.  - Add in chocolate syrup to increase calories and to add in chocolate flavor.   Handouts Given at Previous Appointments: - GG High Calorie, High Protein Foods  Teach back method used.  Monitoring/Evaluation: Continue to Monitor: - Weight trends  - PO intake  - Nutrition supplement acceptance  Follow-up in 3 months.  Total time spent in counseling: 27 minutes.

## 2022-03-09 NOTE — Telephone Encounter (Signed)
Call to mom Misty Stanley. She would like to change DME for incontinence supplies from [employer] to [facility]. She prefers diapers and needs bed pads as well. RN advised will ask Inetta Fermo to place an order. Seen 09/2021.

## 2022-03-09 NOTE — Telephone Encounter (Signed)
  Name of who is calling: Arna Snipe Relationship to Patient: Mom  Best contact number: [phone]  Provider they see: Elveria Rising  Reason for call: Mom wanted to know if provider could change companies where [place]ks get her diapers and bed pads from to the same company she get her Boost? Mom is requesting a callback.     PRESCRIPTION REFILL ONLY  Name of prescription:  Pharmacy:

## 2022-03-16 ENCOUNTER — Ambulatory Visit (INDEPENDENT_AMBULATORY_CARE_PROVIDER_SITE_OTHER): Payer: Medicaid Other | Admitting: Dietician

## 2022-03-16 ENCOUNTER — Encounter (INDEPENDENT_AMBULATORY_CARE_PROVIDER_SITE_OTHER): Payer: Self-pay | Admitting: Dietician

## 2022-03-16 DIAGNOSIS — R638 Other symptoms and signs concerning food and fluid intake: Secondary | ICD-10-CM | POA: Diagnosis not present

## 2022-03-16 DIAGNOSIS — R636 Underweight: Secondary | ICD-10-CM

## 2022-03-16 DIAGNOSIS — R634 Abnormal weight loss: Secondary | ICD-10-CM

## 2022-03-16 DIAGNOSIS — R1310 Dysphagia, unspecified: Secondary | ICD-10-CM

## 2022-03-16 MED ORDER — NUTRITIONAL SUPPLEMENT PLUS PO LIQD: ORAL | 12 refills | Status: DC

## 2022-03-16 NOTE — Progress Notes (Signed)
RD securely emailed updated order for 1.5 kcal/oz nutritional supplement (chocolate only) to [email or web address]

## 2022-03-16 NOTE — Patient Instructions (Signed)
Nutrition Recommendations: - Goal for 2 nutrition supplement per day on most days.  - Let's work on having a boost when [place] wakes up as part of her breakfast and then have another as an evening snack after dinner.  - Limit sugary beverages such as juice to no more than 4-8 oz per day to prevent filling up on sugar calories and rather prioritize nutritious calories.  - Purchase full fat dairy for [place] and remember to add in butter, oil, heavy whipping cream, gravies, etc to add in extra calories.  - I will put in an order with [facility] for a higher calorie nutrition supplement -- Boost plus chocolate flavor only.  - Add in chocolate syrup to increase calories and to add in chocolate flavor.

## 2022-03-25 ENCOUNTER — Other Ambulatory Visit (INDEPENDENT_AMBULATORY_CARE_PROVIDER_SITE_OTHER): Payer: Self-pay | Admitting: Family

## 2022-03-25 DIAGNOSIS — G40309 Generalized idiopathic epilepsy and epileptic syndromes, not intractable, without status epilepticus: Secondary | ICD-10-CM

## 2022-03-25 DIAGNOSIS — G40209 Localization-related (focal) (partial) symptomatic epilepsy and epileptic syndromes with complex partial seizures, not intractable, without status epilepticus: Secondary | ICD-10-CM

## 2022-03-31 ENCOUNTER — Ambulatory Visit: Payer: Medicaid Other | Admitting: Student

## 2022-04-06 ENCOUNTER — Telehealth (INDEPENDENT_AMBULATORY_CARE_PROVIDER_SITE_OTHER): Payer: Medicaid Other | Admitting: Family

## 2022-04-21 ENCOUNTER — Telehealth (INDEPENDENT_AMBULATORY_CARE_PROVIDER_SITE_OTHER): Payer: Self-pay | Admitting: Family

## 2022-04-21 NOTE — Telephone Encounter (Signed)
  Name of who is calling: Miquel Dunn from [facility] Relationship to Patient:  Best contact number: [phone] Fax [phone]  Provider they see: Rockwell Germany  Reason for call: Calling about the patients intraoral supply, also fax req on 10/19+10/20      [place]  Name of prescription:  Pharmacy:

## 2022-04-24 NOTE — Telephone Encounter (Signed)
Last seen by Otila Kluver 09/2021 was to follow up in Oct No showed and has not rescheduled- last seen by Shirlee Limerick 02/2022 f/u scheduled 06/22/22- forms are waiting on signature. Call to [facility]- advised forms were received and waiting on provider to sign- should be able to return today or tomorrow.

## 2022-04-24 NOTE — Telephone Encounter (Signed)
Faxed signed forms to [facility] at fax listed in phone note

## 2022-05-20 ENCOUNTER — Other Ambulatory Visit (INDEPENDENT_AMBULATORY_CARE_PROVIDER_SITE_OTHER): Payer: Self-pay | Admitting: Family

## 2022-05-20 DIAGNOSIS — G40209 Localization-related (focal) (partial) symptomatic epilepsy and epileptic syndromes with complex partial seizures, not intractable, without status epilepticus: Secondary | ICD-10-CM

## 2022-05-20 DIAGNOSIS — G40309 Generalized idiopathic epilepsy and epileptic syndromes, not intractable, without status epilepticus: Secondary | ICD-10-CM

## 2022-05-22 ENCOUNTER — Telehealth: Payer: Self-pay

## 2022-05-22 NOTE — Telephone Encounter (Signed)
Received paperwork for CAP program in nurse box.   Copy made and placed in batch scanning.   Called mother and advised that paperwork is ready for pick up. Placed at front desk.   Veronda Prude, RN

## 2022-06-01 NOTE — Telephone Encounter (Signed)
Form was scanned in, Nehemiah Settle will call mom and let her know that the form was ready for pick up. Jone Baseman, CMA

## 2022-06-08 NOTE — Progress Notes (Signed)
   Medical Nutrition Therapy - Progress Note Appt start time: 3:40 PM Appt end time: 4:02 PM Reason for referral: Loss of weight; congenital quadriplegia Referring provider: Elveria Rising, NP - Neuro Pertinent medical hx: epilepsy, congenital quadriplegia, cerebral palsy, scoliosis, mild intellectual disability, adjustment disorder with emotional disturbance, constipation, dysphagia, impaired nutrition d/t limited access to healthful foods, unintended weight loss  Assessment: Food allergies: none Pertinent Medications: see medication list - pepcid Vitamins/Supplements: none Pertinent labs: no recent labs in Epic  (12/28) Anthropometrics: Ht: 136.8 cm Wt: 32.3 kg BMI: 17.7  IBW based on [place] Equation: 31.8 kg (+/-10%)  03/16/22 Wt: 69 #  10/27/21 Wt: 70 # 10/20/21 Wt: 70# 08/02/21 Wt: 64.5 # 07/17/21 Wt: 72# 08/11/20 Wt: 72#   Estimated minimum caloric needs: 1450-1550 kcal/day (EER) Estimated minimum protein needs: 0.8-1.0 g/kg/day (DRI) Estimated minimum fluid needs: 1450-1550 mL/day (1 mL/kcal)  Primary concerns today: Follow-up given pt with loss of weight and congenital quadriplegia. Mom accompanied pt to appt today.   Dietary Intake Hx: DME: [facility] Usual eating pattern includes: 2-3 meals and 1-2 snacks per day. Mom notes on the weekend she is able to eat breakfast, but during the week it's hard to get breakfast in her as she enjoys sleeping in. Meal duration: <30 minutes  Everyone served same meal: yes  Family meals: yes  Chewing/swallowing difficulties with foods or liquids: none  Texture modifications: pureed   24-hr recall: Breakfast/Lunch (11:30 AM): 2 packets of oatmeal + 1 pudding/applesauce + Boost High Protein Snack (3 PM): applesauce OR pudding Dinner: (6-7 PM): hamburger w/ gravy + cabbage/carrots OR chicken and rice (protein + starch + veggies) + Boost High Protein  Typical Snacks: applesauce, pudding, yogurt  Typical Beverages: water (~4 oz), hawaiian  punch (rarely), capri sun apple juice (3 juice boxes)  Nutrition Supplements: Chocolate Boost High Protein (2x/day) OR Molli Posey Standard (mom unsure of kcal/oz)  Notes: Mom notes that Tinslee has not been receiving Boost High Protein from [facility], however they have been sending [employer] supplement (RD suspects [employer] Standard 1.4). Mom has been Conservation officer, nature Protein with food stamps when necessary. Keyli only likes the flavors vanilla and chocolate of both shakes. Mom reports overall Siera's appetite has improved and she seems to be doing well with nutrition at this time.  Physical Activity: wheelchair-bound  GI: no concern  GU: no concern (pale urine and frequent)   Estimated intake likely not meeting needs given underweight status, however weight gain is improving. Pt consuming various food groups.  Pt consuming inadequate amounts of vegetables and dairy.   Nutrition Diagnosis: (5/4) Underweight related to inadequate energy intake as evidenced by BMI <18.5 and pt dependent on nutritional supplementation to meet nutrition needs.   Intervention: Discussed pt's growth and current intake. Discussed recommendations below. All questions answered, family in agreement with plan.   Nutrition Recommendations: - Continue 2 supplements per day (either Boost or the [employer] supplement).  - Try adding in 1 scoop of Duocal to each supplement for a total of 2 scoops per day. Let me know if Mande likes this, if she does then I can put in an order with [facility].   Handouts Given at Previous Appointments: - GG High Calorie, High Protein Foods  Teach back method used.  Monitoring/Evaluation: Continue to Monitor: - Weight trends  - PO intake  - Nutrition supplement acceptance  Follow-up in 6 months, joint with Tina.  Total time spent in counseling: 22 minutes.

## 2022-06-12 ENCOUNTER — Telehealth (INDEPENDENT_AMBULATORY_CARE_PROVIDER_SITE_OTHER): Payer: Self-pay | Admitting: Family

## 2022-06-12 NOTE — Telephone Encounter (Signed)
  Name of who is calling:Lisa   Caller's Relationship to Patient:mother   Best contact [phone]  Provider they XTA:[facility] Goodpasture   Reason for call:mom called leaving a VM asking for a call back with questions she has regarding getting diapers for [place]. Please advise      PRESCRIPTION REFILL ONLY  Name of prescription:  Pharmacy:

## 2022-06-12 NOTE — Telephone Encounter (Signed)
Contacted pt's mother. Verified pt's name and DOB as well as mother's name.   Mom stated that switching companies for inconvenience supplies was discussed at the last visit but she hasn't heard anything from anyone.   Mom stated that she is currently getting inconvenience supplies from [facility] but was supposed to be switched to [facility]. [facility] has informed mom that the are in need of a new order for pt's incontinence supplies.   Mom still wants to switch to [facility], but would like the this RX to go to [facility] for now. She stated that Cleatis Polka will take too long.  SS, CCMA

## 2022-06-12 NOTE — Telephone Encounter (Signed)
I sent the order for incontinence supplies to [facility] ([facility]). She needs an appointment with me to justify need for the supplies. I sent a message to the scheduler to contact her about an appointment. TG

## 2022-06-21 NOTE — Progress Notes (Signed)
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 She can have oppositional behavior but that has improved over time.   Seizures have been well controlled for years on the combination of Carbatrol and gabapentin which he tolerates without side effects.  She is totally dependent on others for dressing and bathing changing her diaper, is unable to feed herself or prepare meals. She has had problems with weight loss and is being followed by the dietician.  Due to Kristin Fitzgerald's medical condition, she is indefinitely incontinent of stool and urine.  It is medically necessary for her to use diapers, underpads, and gloves to assist with hygiene and skin integrity.      Today's concerns: Following the feeding plan and has maintained her weight since the visit.  Has remained seizure free Having trouble getting incontinence supplies Kristin Fitzgerald has been otherwise generally healthy since she was last seen. No health concerns today other than previously mentioned.  Review of systems: Please see HPI for neurologic and other pertinent review of systems. Otherwise all other systems were reviewed and were negative.  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   Partial epilepsy with impairment of consciousness ([place]) 05/19/2013   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   Scoliosis associated with other condition 05/19/2013   Unspecified disorder of eye movements 05/19/2013   Mild intellectual disability 05/19/2013   Unspecified urinary incontinence 05/19/2013   Unspecified congenital cataract 05/19/2013   Cerebral palsy ([place]) 07/06/2011   Seizure disorder ([place]) 07/06/2011   Heart murmur 07/06/2011   Healthcare maintenance 07/06/2011     Past Medical History:  Diagnosis Date   Cerebral palsy ([place])    Mild intellectual disabilities    Seizure disorder ([place])    Seizures ([place])     Past medical history comments: See HPI Copied from previous record: Birth History 3 lbs. 1 oz. Infant born at [redacted] weeks gestational age   Gestation was complicated by premature delivery for unknown reasons. Nursery Course was complicated by seizures a day 3 of life.  The patient had no hemorrhage but had periventricular leukomalacia. 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 She has 76 CAPS hours per week. Her mother works in childcare.   Surgical history: Past Surgical History:  Procedure Laterality Date   CARDIAC SURGERY     Repaired valve in 2007-[place]   EYE SURGERY       bilateral eye surgery in infancy  HIP ARTHROPLASTY     for recurrent dislocation; rebuilt acetabulum   INGUINAL HERNIA REPAIR     KIDNEY SURGERY      kidney/bladder surgery in infancy for  "blockage"   OTHER SURGICAL HISTORY     [place] [facility] as an infant     Family history: family history includes Asthma in her brother; Breast cancer in her maternal aunt; Kidney failure in her maternal grandmother; Leukemia in her maternal grandfather.   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   She lives with her mother and her brother Kristin Fitzgerald.   Social Determinants of Health   Financial Resource Strain: Not on file  Food Insecurity: No Food Insecurity (12/21/2021)   Hunger Vital Sign    Worried About Running Out of Food in the Last Year: Never true    Ran Out of Food in the Last Year: Never true  Transportation Needs: No Transportation Needs (12/21/2021)   PRAPARE - Administrator, Civil Service (Medical): No    Lack of Transportation (Non-Medical): No  Physical Activity: Not on file  Stress: Not on file  Social Connections: Not on file  Intimate Partner Violence: Not on file    Past/failed meds:  Allergies: Allergies  Allergen Reactions   Demerol Nausea And Vomiting    Immunizations: Immunization History  Administered Date(s) Administered   Hepatitis B 07/06/2011, 11/07/2011   Influenza Split 07/06/2011, 05/31/2012   Influenza,inj,Quad PF,6+ Mos 04/16/2013, 04/21/2014, 06/07/2015, 07/07/2016, 05/07/2017, 04/30/2018, 03/05/2019, 05/12/2020     Diagnostics/Screenings: Copied from previous record: 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   Physical Exam: BP 130/72   Pulse 88   Wt 71 lb (32.2 kg)   BMI 17.77 kg/m   General: small for age but well developed, well nourished woman, seated in wheelchair, in no evident distress Head: normocephalic and atraumatic. Oropharynx benign. No dysmorphic features. Neck: supple Cardiovascular: regular rate and rhythm, no murmurs. Respiratory: clear to auscultation bilaterally Abdomen: bowel sounds present all four quadrants, abdomen soft, non-tender, non-distended. Musculoskeletal: no skeletal deformities. Has severe convex left neuromuscular scoliosis. Has contractures at the right elbow, both knees and tight heel cords. Has right hemiatrophy upper greater than lower extremities.  Skin: no rashes or neurocutaneous lesions  Neurologic Exam Mental Status: awake and fully alert. Has very limited language.  Smiles responsively. Tolerant of invasions into her space Cranial Nerves: fundoscopic exam - red reflex present.  Unable to fully visualize fundus.  Pupils equal briskly reactive to light.  Turns to localize faces and objects in the periphery. Turns to localize sounds in the periphery. Facial movements are symmetric. Motor: spastic quadriparesis. Poor fine motor movements Sensory: withdrawal x 4  Coordination: unable to adequately assess due to patient's inability to participate in examination. No dysmetria when reaching for objects. Gait and Station: unable to stand and bear weight.  Reflexes: diminished and symmetric. Toes neutral. 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 No recent diagnostic studies. She has remained seizure free since the last visit. She has been doing better with intake and has maintained her weight. She was seen today in joint visit with the dietician.  Plan until next visit: Continue medications and feeding plan as prescribed  Let me know if seizures occur I will send a referral to [facility] for incontinence supplies.  Return in about 6 months (around 12/22/2022).  The medication list was reviewed and reconciled. No changes were made in the prescribed medications today. A complete medication list was provided to the patient.  Allergies as of 06/22/2022       Reactions   Demerol Nausea And Vomiting        Medication List        Accurate as of June 22, 2022 11:59 PM. If you have any questions, ask your nurse or doctor.          STOP taking these medications    famotidine 40 MG/5ML suspension Commonly known as: PEPCID Stopped by: Rockwell Germany, NP   fluticasone 50 MCG/ACT nasal spray Commonly known as: FLONASE Stopped by: Rockwell Germany, NP   magic mouthwash (lidocaine, diphenhydrAMINE, alum & mag hydroxide) suspension Stopped by: Rockwell Germany, NP   Melatonin 1 MG/ML Liqd Stopped by: Rockwell Germany, NP   Ventolin HFA 108 (90 Base) MCG/ACT inhaler Generic drug: albuterol Stopped by: Rockwell Germany, NP       TAKE these medications    carbamazepine 200 MG 12 hr capsule Commonly known as: CARBATROL TAKE 1 CAPSULE BY MOUTH TWICE A DAY   cetirizine HCl 5 MG/5ML Syrp Commonly known as: Zyrtec Take 10 mLs (10 mg total) by mouth daily.   gabapentin 100  MG capsule Commonly known as: NEURONTIN TAKE 1 CAPSULE IN THE MORNING AND 2 CAPSULES AT BEDTIME   Nutritional Supplement Plus Liqd 2 Boost Plus (chocolate only) or high-calorie (1.5 kcal/oz, chocolate only) alternative given PO daily.      I discussed this patient's care with the dietician involved in her care today to develop this assessment and plan.   Total time spent with the patient was 20 minutes, of which 50% or more was spent in counseling and coordination of care.  Rockwell Germany NP-C [facility] [id number] [place], [place] [address] Ph. [phone] Fax [phone]

## 2022-06-22 ENCOUNTER — Encounter (INDEPENDENT_AMBULATORY_CARE_PROVIDER_SITE_OTHER): Payer: Self-pay

## 2022-06-22 ENCOUNTER — Ambulatory Visit (INDEPENDENT_AMBULATORY_CARE_PROVIDER_SITE_OTHER): Payer: Medicaid Other | Admitting: Dietician

## 2022-06-22 ENCOUNTER — Ambulatory Visit (INDEPENDENT_AMBULATORY_CARE_PROVIDER_SITE_OTHER): Payer: Medicaid Other | Admitting: Family

## 2022-06-22 VITALS — Wt 71.0 lb

## 2022-06-22 VITALS — BP 130/72 | HR 88 | Wt 71.0 lb

## 2022-06-22 DIAGNOSIS — R1310 Dysphagia, unspecified: Secondary | ICD-10-CM

## 2022-06-22 DIAGNOSIS — N3942 Incontinence without sensory awareness: Secondary | ICD-10-CM

## 2022-06-22 DIAGNOSIS — R636 Underweight: Secondary | ICD-10-CM

## 2022-06-22 DIAGNOSIS — G808 Other cerebral palsy: Secondary | ICD-10-CM | POA: Diagnosis not present

## 2022-06-22 DIAGNOSIS — R159 Full incontinence of feces: Secondary | ICD-10-CM

## 2022-06-22 DIAGNOSIS — G40309 Generalized idiopathic epilepsy and epileptic syndromes, not intractable, without status epilepticus: Secondary | ICD-10-CM

## 2022-06-22 DIAGNOSIS — R638 Other symptoms and signs concerning food and fluid intake: Secondary | ICD-10-CM | POA: Diagnosis not present

## 2022-06-22 DIAGNOSIS — G800 Spastic quadriplegic cerebral palsy: Secondary | ICD-10-CM

## 2022-06-22 DIAGNOSIS — G40909 Epilepsy, unspecified, not intractable, without status epilepticus: Secondary | ICD-10-CM | POA: Diagnosis not present

## 2022-06-22 DIAGNOSIS — M4145 Neuromuscular scoliosis, thoracolumbar region: Secondary | ICD-10-CM

## 2022-06-22 DIAGNOSIS — G40209 Localization-related (focal) (partial) symptomatic epilepsy and epileptic syndromes with complex partial seizures, not intractable, without status epilepticus: Secondary | ICD-10-CM

## 2022-06-22 DIAGNOSIS — G40109 Localization-related (focal) (partial) symptomatic epilepsy and epileptic syndromes with simple partial seizures, not intractable, without status epilepticus: Secondary | ICD-10-CM

## 2022-06-22 NOTE — Patient Instructions (Signed)
Nutrition Recommendations: - Continue 2 supplements per day (either Boost or the [employer] supplement).  - Try adding in 1 scoop of Duocal to each supplement for a total of 2 scoops per day. Let me know if Kristin Fitzgerald likes this, if she does then I can put in an order with [facility].

## 2022-06-22 NOTE — Patient Instructions (Addendum)
It was a pleasure to see you today!  Instructions for you until your next appointment are as follows: Continue Kristin Fitzgerald's medications as prescribed Follow the recommendations given today by the dietician.  I will send a referral to [facility] for incontinence supplies. You will receive a call from that company to arrange delivery.  Let me know if you have any questions or concerns. Please sign up for MyChart if you have not done so. Please plan to return for follow up in 6 months or sooner if needed.  Feel free to contact our office during normal business hours at [phone] with questions or concerns. If there is no answer or the call is outside business hours, please leave a message and our clinic staff will call you back within the next business day.  If you have an urgent concern, please stay on the line for our after-hours answering service and ask for the on-call neurologist.     I also encourage you to use MyChart to communicate with me more directly. If you have not yet signed up for MyChart within [facility], the front desk staff can help you. However, please note that this inbox is NOT monitored on nights or weekends, and response can take up to 2 business days.  Urgent matters should be discussed with the on-call pediatric neurologist.   At [facility], we are committed to providing exceptional care. You will receive a patient satisfaction survey through text or email regarding your visit today. Your opinion is important to me. Comments are appreciated.

## 2022-06-25 ENCOUNTER — Encounter (INDEPENDENT_AMBULATORY_CARE_PROVIDER_SITE_OTHER): Payer: Self-pay | Admitting: Family

## 2022-06-25 DIAGNOSIS — R159 Full incontinence of feces: Secondary | ICD-10-CM | POA: Insufficient documentation

## 2022-06-25 DIAGNOSIS — R638 Other symptoms and signs concerning food and fluid intake: Secondary | ICD-10-CM | POA: Insufficient documentation

## 2022-06-25 DIAGNOSIS — R636 Underweight: Secondary | ICD-10-CM | POA: Insufficient documentation

## 2022-07-10 ENCOUNTER — Ambulatory Visit (INDEPENDENT_AMBULATORY_CARE_PROVIDER_SITE_OTHER): Payer: Medicaid Other | Admitting: Family

## 2022-07-12 ENCOUNTER — Telehealth (INDEPENDENT_AMBULATORY_CARE_PROVIDER_SITE_OTHER): Payer: Self-pay | Admitting: Family

## 2022-07-12 NOTE — Telephone Encounter (Signed)
  Name of who is calling:Lisa   Caller's Relationship to Patient:mother   Best contact [place]  Provider they FMB:WGYKZ Donna Christen   Reason for call:mom called to let you know that  still sending the high protein drinks and not the calorie drinks for [place].      PRESCRIPTION REFILL ONLY  Name of prescription:  Pharmacy:

## 2022-07-12 NOTE — Telephone Encounter (Signed)
Received secure message from [name] with [facility]:   Hi Kristin Fitzgerald,   I've looped in [place] who is managing this pts account.   [name], pt. [phone].   Looks like we may have the wrong provider tagged as Kristin Fitzgerald helps via docusign for [name], MD. If you will update ppw and send to her via docusign for the size change order, we would appreciate it.

## 2022-07-12 NOTE — Telephone Encounter (Signed)
I have not received any paperwork, will call [facility] to follow up.

## 2022-07-12 NOTE — Telephone Encounter (Signed)
  Name of who is calling:Lisa   Caller's Relationship to Patient:mother   Best contact [phone]   Provider they VHQ:[facility] Good[facility]   Reason for call:mom stated that [facility] sent a email stating that they can't send anymore supplies out until the paperwork was filled out and signed. Mom is unaware of the papers they are needing. Please advise      PRESCRIPTION REFILL ONLY  Name of prescription:  Pharmacy:

## 2022-07-13 NOTE — Telephone Encounter (Signed)
RD securely emailed [facility] representative Frutoso Schatz as well as [facility] medical record email to determine what supplement is being sent. Email read as followed:   "Hi Earnest Bailey,   I hope you're doing well. I have received a phone call from Sabiha Sura (DOB: October 04, 1990) mother who is saying she continues to get high protein drinks sent to her rather than high calorie drinks. The order I put in September was for 2 Boost Plus or High Calorie (1.5 kcal/oz chocolate only) alternative. Can you confirm what is being sent to the family?   Thank you! "

## 2022-07-13 NOTE — Telephone Encounter (Signed)
Received the forms via docusign, completed and submitted.

## 2022-07-17 ENCOUNTER — Telehealth (INDEPENDENT_AMBULATORY_CARE_PROVIDER_SITE_OTHER): Payer: Self-pay | Admitting: Dietician

## 2022-07-17 ENCOUNTER — Encounter (INDEPENDENT_AMBULATORY_CARE_PROVIDER_SITE_OTHER): Payer: Self-pay

## 2022-07-17 NOTE — Telephone Encounter (Signed)
RD returned call to mom who noted that [facility] said they need something else from our office. Mom was unable to provide what was needed, RD will reach out to [facility] to ensure they get what is needed to expedite order. Mom expressed understanding.

## 2022-07-17 NOTE — Telephone Encounter (Signed)
  Name of who is calling: Victory Dakin Relationship to Patient: mom  Best contact number: [phone]  Provider they see: Salvadore Oxford  Reason for call: more info needed to process new prescription sent in by [place]  Name of prescription:  Pharmacy:

## 2022-11-20 ENCOUNTER — Other Ambulatory Visit (INDEPENDENT_AMBULATORY_CARE_PROVIDER_SITE_OTHER): Payer: Self-pay | Admitting: Neurology

## 2022-11-20 DIAGNOSIS — G40309 Generalized idiopathic epilepsy and epileptic syndromes, not intractable, without status epilepticus: Secondary | ICD-10-CM

## 2022-11-20 DIAGNOSIS — G40209 Localization-related (focal) (partial) symptomatic epilepsy and epileptic syndromes with complex partial seizures, not intractable, without status epilepticus: Secondary | ICD-10-CM

## 2022-11-21 NOTE — Telephone Encounter (Signed)
Last OV: 06-22-2022.   Next OV: 12-18-2022 Inetta Fermo)  Last Rx: 05-22-2022 with 5 Ref.  B. Roten CMA

## 2022-12-18 ENCOUNTER — Ambulatory Visit (INDEPENDENT_AMBULATORY_CARE_PROVIDER_SITE_OTHER): Payer: Medicaid Other | Admitting: Dietician

## 2022-12-18 ENCOUNTER — Ambulatory Visit (INDEPENDENT_AMBULATORY_CARE_PROVIDER_SITE_OTHER): Payer: Self-pay | Admitting: Family

## 2023-01-01 ENCOUNTER — Other Ambulatory Visit (INDEPENDENT_AMBULATORY_CARE_PROVIDER_SITE_OTHER): Payer: Self-pay | Admitting: Family

## 2023-01-01 DIAGNOSIS — G40309 Generalized idiopathic epilepsy and epileptic syndromes, not intractable, without status epilepticus: Secondary | ICD-10-CM

## 2023-01-01 DIAGNOSIS — G40209 Localization-related (focal) (partial) symptomatic epilepsy and epileptic syndromes with complex partial seizures, not intractable, without status epilepticus: Secondary | ICD-10-CM

## 2023-01-14 NOTE — Progress Notes (Deleted)
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 She can have oppositional behavior but that has improved over time.    Seizures have been well controlled for years on the combination of Carbatrol and gabapentin which he tolerates without side effects.  She is totally dependent on others for dressing and bathing changing her diaper, is unable to feed herself or prepare meals. She has had problems with weight loss and is being followed by the dietician.   Due to [name]'s medical condition, she is indefinitely incontinent of stool and urine.  It is medically necessary for her to use diapers, underpads, and gloves to assist with hygiene and skin integrity.     Today's concerns: [name] is seen in joint visit today with dietician John Giovanni, RD  Paulett has been otherwise generally healthy since she was last seen. No health concerns today other than previously mentioned.  Review of systems: Please see HPI for neurologic and other pertinent review of systems. Otherwise all other systems were reviewed and were negative.  Problem List: Patient Active Problem List   Diagnosis Date Noted   Increased nutritional needs [R63.8] 06/25/2022   Underweight [R63.6] 06/25/2022   Inadequate oral intake [R63.8] 06/25/2022   Full incontinence of feces 06/25/2022   Caregiver stress 12/12/2021   Insomnia 12/12/2021    Impaired nutrition 10/20/2021   Cough 08/03/2021   Loss of weight 08/11/2020   COVID-19 vaccine dose not administered 08/04/2020   Adjustment disorder with emotional disturbance 08/05/2019   Need for immunization against influenza 05/07/2017   Nutrition impaired due to limited access to healthful foods 05/07/2017   Thrush 07/09/2016   Constipation 07/09/2016   Dysphagia 10/25/2015   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   Scoliosis associated with other condition 05/19/2013   Unspecified disorder of eye movements 05/19/2013   Mild intellectual disability 05/19/2013   Unspecified urinary incontinence 05/19/2013   Unspecified congenital cataract 05/19/2013   Cerebral palsy (HCC) 07/06/2011   Seizure disorder (HCC) 07/06/2011   Heart murmur 07/06/2011   Healthcare maintenance 07/06/2011     Past Medical History:  Diagnosis Date   Cerebral palsy (HCC)    Mild intellectual disabilities    Seizure disorder (HCC)    Seizures (HCC)     Past medical history comments: See HPI Copied from previous record: Birth History 3 lbs. 1 oz. Infant born at [redacted] weeks gestational age   Gestation was complicated by premature delivery for unknown reasons. Nursery Course was complicated by seizures a day 3 of life.  The patient had no hemorrhage but had periventricular leukomalacia. 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 She has 76 CAPS hours per week. Her mother works in childcare.  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   She lives with her mother and her brother Berna Spare.   Social Determinants of Health   Financial Resource Strain: Not on file  Food Insecurity: No Food Insecurity (12/21/2021)   Hunger Vital Sign    Worried About Running Out of Food in the Last Year: Never true    Ran Out of Food in the Last Year: Never true  Transportation Needs: No Transportation Needs (12/21/2021)   PRAPARE - Administrator, Civil Service (Medical): No    Lack of Transportation (Non-Medical): No  Physical Activity: Not on file  Stress: Not on file  Social Connections: Not on file  Intimate Partner Violence: Not on file    Past/failed meds:  Allergies: Allergies  Allergen Reactions   Demerol Nausea And Vomiting    Immunizations: Immunization History  Administered Date(s) Administered   Hepatitis B 07/06/2011, 11/07/2011   Influenza Split 07/06/2011, 05/31/2012   Influenza,inj,Quad PF,6+ Mos 04/16/2013,  04/21/2014, 06/07/2015, 07/07/2016, 05/07/2017, 04/30/2018, 03/05/2019, 05/12/2020    Diagnostics/Screenings: Copied from previous record: 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   Physical Exam: There were no vitals taken for this visit.  Wt Readings from Last 3 Encounters:  06/22/22 71 lb (32.2 kg)  06/22/22 71 lb (32.2 kg)  03/16/22 68 lb 9.6 oz (31.1 kg)    General: well developed, well nourished, seated, in no evident distress Head: normocephalic and atraumatic. Oropharynx benign. No dysmorphic features. Neck: supple Cardiovascular: regular rate and rhythm, no murmurs. Respiratory: clear to auscultation bilaterally Abdomen: bowel sounds present all four quadrants, abdomen soft, non-tender, non-distended. No hepatosplenomegaly or masses palpated.Gastrostomy tube in place size *** Musculoskeletal: no skeletal deformities or obvious scoliosis. Has contractures**** Skin: no rashes or neurocutaneous lesions  Neurologic Exam Mental Status: awake and fully alert. Has no language.  Smiles responsively. Resistant to invasions into ***space Cranial Nerves: fundoscopic exam - red reflex present.  Unable to fully visualize fundus.  Pupils equal briskly reactive to light.  Turns to localize faces and objects in the periphery. Turns to localize sounds in  the periphery. Facial movements are asymmetric, has lower facial weakness with drooling.  Neck flexion and extension *** abnormal with poor head control.  Motor: truncal hypotonia.  *** spastic quadriparesis  Sensory: withdrawal x 4 Coordination: unable to adequately assess due to patient's inability to participate in examination. No dysmetria when reaching for objects. Gait and Station: unable to independently stand and bear weight. Able to stand with assistance but needs constant support. Able to take a few steps but has poor balance and needs support.   Reflexes: diminished and symmetric. Toes neutral. No clonus   Impression: Generalized convulsive epilepsy (HCC)  Partial epilepsy with impairment of consciousness (HCC)  Congenital quadriplegia (HCC)  Mild intellectual disability  Neuromuscular scoliosis of thoracolumbar region  Urinary incontinence without sensory awareness  Dysphagia, unspecified type  Nutrition impaired due to limited access to healthful foods  Impaired nutrition  Caregiver stress  Insomnia, unspecified type  Underweight [R63.6]  Inadequate oral intake [R63.8]  Full incontinence of feces    Recommendations for plan of care: The patient's previous Epic records were reviewed. No recent diagnostic studies to be reviewed with the patient.  Plan until next visit: Follow the recommendations given by the dietician today Continue medications as prescribed  Call for seizures, or questions or concerns No follow-ups on file.  The medication list was reviewed and reconciled. No changes were made in the prescribed medications today. A complete medication list was provided to the patient.  No orders of the defined types were placed in this encounter.    Allergies as of 01/15/2023       Reactions   Demerol Nausea And Vomiting        Medication List        Accurate as of January 14, 2023  4:28 PM. If you have any questions, ask your nurse or doctor.          carbamazepine 200 MG 12 hr capsule Commonly known as: CARBATROL TAKE 1 CAPSULE BY MOUTH TWICE A DAY   cetirizine HCl 5 MG/5ML Syrp Commonly known as: Zyrtec Take 10 mLs (10 mg total) by mouth daily.   gabapentin 100 MG capsule Commonly known as: NEURONTIN TAKE 1 CAPSULE IN THE MORNING AND 2 CAPSULES AT BEDTIME   Nutritional Supplement Plus Liqd 2 Boost Plus (chocolate only) or high-calorie (1.5 kcal/oz, chocolate only) alternative given PO daily.            I discussed this patient's care with the multiple providers involved  in her care today to develop this assessment and plan.   Total time spent with the patient was *** minutes, of which 50% or more was spent in counseling and coordination of care.  Elveria Rising NP-C [facility] [address] [place], [place] [address] Ph. [phone] Fax [phone]

## 2023-01-15 ENCOUNTER — Ambulatory Visit (INDEPENDENT_AMBULATORY_CARE_PROVIDER_SITE_OTHER): Payer: MEDICAID | Admitting: Family

## 2023-01-15 ENCOUNTER — Ambulatory Visit (INDEPENDENT_AMBULATORY_CARE_PROVIDER_SITE_OTHER): Payer: MEDICAID | Admitting: Dietician

## 2023-01-15 DIAGNOSIS — G40309 Generalized idiopathic epilepsy and epileptic syndromes, not intractable, without status epilepticus: Secondary | ICD-10-CM

## 2023-01-15 DIAGNOSIS — F70 Mild intellectual disabilities: Secondary | ICD-10-CM

## 2023-01-15 DIAGNOSIS — R1310 Dysphagia, unspecified: Secondary | ICD-10-CM

## 2023-01-15 DIAGNOSIS — R159 Full incontinence of feces: Secondary | ICD-10-CM

## 2023-01-15 DIAGNOSIS — E639 Nutritional deficiency, unspecified: Secondary | ICD-10-CM

## 2023-01-15 DIAGNOSIS — M4145 Neuromuscular scoliosis, thoracolumbar region: Secondary | ICD-10-CM

## 2023-01-15 DIAGNOSIS — G808 Other cerebral palsy: Secondary | ICD-10-CM

## 2023-01-15 DIAGNOSIS — G40209 Localization-related (focal) (partial) symptomatic epilepsy and epileptic syndromes with complex partial seizures, not intractable, without status epilepticus: Secondary | ICD-10-CM

## 2023-01-15 DIAGNOSIS — R638 Other symptoms and signs concerning food and fluid intake: Secondary | ICD-10-CM

## 2023-01-15 DIAGNOSIS — Z636 Dependent relative needing care at home: Secondary | ICD-10-CM

## 2023-01-15 DIAGNOSIS — G4700 Insomnia, unspecified: Secondary | ICD-10-CM

## 2023-01-15 DIAGNOSIS — R636 Underweight: Secondary | ICD-10-CM

## 2023-01-15 DIAGNOSIS — N3942 Incontinence without sensory awareness: Secondary | ICD-10-CM

## 2023-01-25 ENCOUNTER — Other Ambulatory Visit (INDEPENDENT_AMBULATORY_CARE_PROVIDER_SITE_OTHER): Payer: Self-pay | Admitting: Family

## 2023-01-25 DIAGNOSIS — G40209 Localization-related (focal) (partial) symptomatic epilepsy and epileptic syndromes with complex partial seizures, not intractable, without status epilepticus: Secondary | ICD-10-CM

## 2023-01-25 DIAGNOSIS — G40309 Generalized idiopathic epilepsy and epileptic syndromes, not intractable, without status epilepticus: Secondary | ICD-10-CM

## 2023-02-19 ENCOUNTER — Other Ambulatory Visit (INDEPENDENT_AMBULATORY_CARE_PROVIDER_SITE_OTHER): Payer: Self-pay | Admitting: Family

## 2023-02-19 DIAGNOSIS — G40209 Localization-related (focal) (partial) symptomatic epilepsy and epileptic syndromes with complex partial seizures, not intractable, without status epilepticus: Secondary | ICD-10-CM

## 2023-02-19 DIAGNOSIS — G40309 Generalized idiopathic epilepsy and epileptic syndromes, not intractable, without status epilepticus: Secondary | ICD-10-CM

## 2023-03-06 ENCOUNTER — Encounter: Payer: Self-pay | Admitting: Student

## 2023-03-06 ENCOUNTER — Telehealth: Payer: Self-pay | Admitting: Student

## 2023-03-06 ENCOUNTER — Ambulatory Visit (INDEPENDENT_AMBULATORY_CARE_PROVIDER_SITE_OTHER): Payer: MEDICAID | Admitting: Student

## 2023-03-06 VITALS — BP 124/62 | HR 88

## 2023-03-06 DIAGNOSIS — Z23 Encounter for immunization: Secondary | ICD-10-CM | POA: Diagnosis not present

## 2023-03-06 DIAGNOSIS — Z741 Need for assistance with personal care: Secondary | ICD-10-CM | POA: Insufficient documentation

## 2023-03-06 NOTE — Telephone Encounter (Signed)
03/06/23 dropped off form at front desk for CAP/DA.  Verified that patient section of form has been completed.  Last DOS/WCC with PCP was 03/06/23.  Placed form in red team folder to be completed by clinical staff.  Vilinda Blanks

## 2023-03-06 NOTE — Progress Notes (Signed)
  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 She only get's 18 hours of time a week. She needs more help/support with her daughter. She report's she tried to fax the papers in August, but I have not seen them. She note's she will try to bring papers this week. CAPS program will pay for more. Does not want her to go to a home, but wants her to get an aide. Mother note's I will have to call them, for them to accept the paperwork.    PERTINENT  PMH / PSH:   Past Medical History:  Diagnosis Date   Cerebral palsy (HCC)    Mild intellectual disabilities    Seizure disorder (HCC)    Seizures (HCC)    OBJECTIVE:  BP 124/62   Pulse 88   LMP 03/04/2023   SpO2 98%  Physical Exam Constitutional:      General: She is not in acute distress.    Appearance: She is not ill-appearing.  Cardiovascular:     Rate and Rhythm: Normal rate and regular rhythm.     Pulses: Normal pulses.     Heart sounds: Normal heart sounds. No murmur heard.    No friction rub. No gallop.  Pulmonary:     Effort: Pulmonary effort is normal. No respiratory distress.     Breath sounds: Normal breath sounds. No stridor. No wheezing, rhonchi or rales.  Abdominal:     General: There is no distension.     Palpations: Abdomen is soft. There is no mass.     Tenderness: There is no abdominal tenderness. There is no guarding.     Hernia: No hernia is present.  Neurological:     Mental Status: She is alert.      ASSESSMENT/PLAN:  Encounter for immunization -     Flu vaccine trivalent PF, 6mos and older(Flulaval,Afluria,Fluarix,Fluzone)  Need for assistance with personal care Assessment & Plan: Patient's mother comes in for help with daughter. Patient has numerous impairments and mother needs help at home. Mom gets 18 hours a week, but says that's not enough and is looking to get daughter into CAP program. She notes that paperwork was supposed to have  been faxed, however, provider never received it. Mother plans to bring paperwork to clinic later today, for completion. Provider is in support of mother receiving a personal aid/more support at home. Will complete paperwork once received. -Awaiting paperwork, will complete    No follow-ups on file. Bess Kinds, MD 03/06/2023, 12:28 PM PGY-3, [facility]

## 2023-03-06 NOTE — Patient Instructions (Signed)
It was great to see you! Thank you for allowing me to participate in your care!    Our plans for today:  - Paperwork for more support/aide I'm sorry we didn't receive the paperwork. Please bring a copy back to the clinic when you can, and we will get it taken care of. Leave the fax information on the paperwork as well.   Take care and seek immediate care sooner if you develop any concerns.   Dr. Bess Kinds, MD [facility]

## 2023-03-06 NOTE — Assessment & Plan Note (Signed)
Patient's mother comes in for help with daughter. Patient has numerous impairments and mother needs help at home. Mom gets 18 hours a week, but says that's not enough and is looking to get daughter into CAP program. She notes that paperwork was supposed to have been faxed, however, provider never received it. Mother plans to bring paperwork to clinic later today, for completion. Provider is in support of mother receiving a personal aid/more support at home. Will complete paperwork once received. -Awaiting paperwork, will complete

## 2023-03-07 NOTE — Telephone Encounter (Signed)
Form has been placed in your box to be completed, please finish when you have time. Thank you! Dayshia Ottley CMA  

## 2023-03-08 ENCOUNTER — Ambulatory Visit (INDEPENDENT_AMBULATORY_CARE_PROVIDER_SITE_OTHER): Payer: Self-pay | Admitting: Family

## 2023-03-08 ENCOUNTER — Telehealth (INDEPENDENT_AMBULATORY_CARE_PROVIDER_SITE_OTHER): Payer: MEDICAID | Admitting: Dietician

## 2023-03-08 NOTE — Progress Notes (Deleted)
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 She can have oppositional behavior but that has improved over time.    Seizures have been well controlled for years on the combination of Carbatrol and gabapentin which he tolerates without side effects.  She is totally dependent on others for dressing and bathing changing her diaper, is unable to feed herself or prepare meals. She has had problems with weight loss and is being followed by the dietician.   Due to Kristin Fitzgerald's medical condition, she is indefinitely incontinent of stool and urine.  It is medically necessary for her to use diapers, underpads, and gloves to assist with hygiene and skin integrity.     Today's concerns: She  Kristin Fitzgerald has been otherwise generally healthy since she was last seen. No health concerns today other than previously mentioned.  Review of systems: Please see HPI for neurologic and other pertinent review of systems. Otherwise all other systems were reviewed and were negative.  Problem List: Patient Active Problem List   Diagnosis Date Noted   Need for assistance with personal care 03/06/2023   Increased nutritional needs [R63.8] 06/25/2022   Underweight [R63.6] 06/25/2022   Inadequate oral intake [R63.8] 06/25/2022   Full incontinence of feces 06/25/2022   Caregiver stress 12/12/2021   Insomnia  12/12/2021   Impaired nutrition 10/20/2021   Cough 08/03/2021   Loss of weight 08/11/2020   COVID-19 vaccine dose not administered 08/04/2020   Adjustment disorder with emotional disturbance 08/05/2019   Need for immunization against influenza 05/07/2017   Nutrition impaired due to limited access to healthful foods 05/07/2017   Thrush 07/09/2016   Constipation 07/09/2016   Dysphagia 10/25/2015   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   Scoliosis associated with other condition 05/19/2013   Unspecified disorder of eye movements 05/19/2013   Mild intellectual disability 05/19/2013   Unspecified urinary incontinence 05/19/2013   Unspecified congenital cataract 05/19/2013   Cerebral palsy (HCC) 07/06/2011   Seizure disorder (HCC) 07/06/2011   Heart murmur 07/06/2011   Healthcare maintenance 07/06/2011     Past Medical History:  Diagnosis Date   Cerebral palsy (HCC)    Mild intellectual disabilities    Seizure disorder (HCC)    Seizures (HCC)     Past medical history comments: See HPI Copied from previous record: Birth History 3 lbs. 1 oz. Infant born at [redacted] weeks gestational age   Gestation was complicated by premature delivery for unknown reasons. Nursery Course was complicated by seizures a day 3 of life.  The patient had no hemorrhage but had periventricular leukomalacia. 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 She has 76 CAPS hours per week. Her mother works in childcare.  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   She lives with her mother and her brother Berna Spare.   Social Determinants of Health   Financial Resource Strain: Not on file  Food Insecurity: No Food Insecurity (12/21/2021)   Hunger Vital Sign    Worried About Running Out of Food in the Last Year: Never true    Ran Out of Food in the Last Year: Never true  Transportation Needs: No Transportation Needs (12/21/2021)   PRAPARE - Administrator, Civil Service (Medical): No    Lack of Transportation (Non-Medical): No  Physical Activity: Not on file  Stress: Not on file  Social Connections: Not on file  Intimate Partner Violence: Not on file    Past/failed meds:  Allergies: Allergies  Allergen Reactions   Demerol Nausea And Vomiting    Immunizations: Immunization History  Administered Date(s) Administered   Hepatitis B 07/06/2011, 11/07/2011   Influenza Split 07/06/2011, 05/31/2012   Influenza, Seasonal, Injecte,  Preservative Fre 03/06/2023   Influenza,inj,Quad PF,6+ Mos 04/16/2013, 04/21/2014, 06/07/2015, 07/07/2016, 05/07/2017, 04/30/2018, 03/05/2019, 05/12/2020    Diagnostics/Screenings: Copied from previous record: CT brain June 16, 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   Physical Exam: LMP 03/04/2023   Wt Readings from Last 3 Encounters:  06/22/22 71 lb (32.2 kg)  06/22/22 71 lb (32.2 kg)  03/16/22 68 lb 9.6 oz (31.1 kg)  General: small for age but well developed, well nourished woman, seated in wheelchair, in no evident distress Head: normocephalic and atraumatic. Oropharynx difficult to examine but appears benign. No dysmorphic features. Neck: supple Cardiovascular: regular rate and rhythm, no murmurs. Respiratory: clear to auscultation bilaterally Abdomen: bowel sounds present all four quadrants, abdomen soft, non-tender, non-distended.  Musculoskeletal: no skeletal deformities. Has severe convex left neuromuscular scoliosis. Has contractures t the right elbow, both knees and tight heel cords. Has right hemiatrophy upper greater than lower extremities Skin: no rashes or neurocutaneous lesions  Neurologic Exam Mental Status: awake and fully alert. Has very limited language.  Smiles responsively. Unable to follow instructions or participate in examination Cranial Nerves: fundoscopic exam - red reflex  present.  Unable to fully visualize fundus.  Pupils equal briskly reactive to light.  Turns to localize faces and objects in the periphery. Turns to localize sounds in the periphery. Facial movements are symmetric. Motor: spastic quadriparesis. Poor fine motor movements Sensory: withdrawal x 4 Coordination: unable to adequately assess due to patient's inability to participate in examination. No dysmetria when reaching for objects. Gait and Station: unable to independently stand and bear weight.   Reflexes: diminished and  symmetric. Toes neutral. No clonus   Impression: Generalized convulsive epilepsy (HCC)  Partial epilepsy with impairment of consciousness (HCC)  Congenital quadriplegia (HCC)  Mild intellectual disability  Neuromuscular scoliosis of thoracolumbar region  Underweight [R63.6]   Recommendations for plan of care: The patient's previous Epic records were reviewed. No recent diagnostic studies to be reviewed with the patient.  Plan until next visit: Continue medications as prescribed  Call for questions or concerns No follow-ups on file.  The medication list was reviewed and reconciled. No changes were made in the prescribed medications today. A complete medication list was provided to the patient.  No orders of the defined types were placed in this encounter.    Allergies as of 03/08/2023       Reactions   Demerol Nausea And Vomiting        Medication List        Accurate as of March 08, 2023  8:06 AM. If you have any questions, ask your nurse or doctor.          carbamazepine 200 MG 12 hr capsule Commonly known as: CARBATROL TAKE 1 CAPSULE BY MOUTH TWICE A DAY   cetirizine HCl 5 MG/5ML Syrp Commonly known as: Zyrtec Take 10 mLs (10 mg total) by mouth daily.   gabapentin 100 MG capsule Commonly known as: NEURONTIN TAKE 1 CAPSULE IN THE MORNING AND 2 CAPSULES AT BEDTIME   Nutritional Supplement Plus Liqd 2 Boost Plus (chocolate only) or high-calorie (1.5 kcal/oz, chocolate only) alternative given PO daily.      Total time spent with the patient was *** minutes, of which 50% or more was spent in counseling and coordination of care.  Elveria Rising NP-C [facility] [address] [place], [place] [address] Ph. [phone] Fax [phone]

## 2023-03-13 NOTE — Telephone Encounter (Signed)
Med list and problem list attached. Faxed to provided number per ROI.   Copy made and placed in batch scanning.   Veronda Prude, RN

## 2023-03-13 NOTE — Telephone Encounter (Signed)
Please attach med list and problem list. Reviewed, completed, and signed form.  Note routed to RN team inbasket and placed completed form in RN Wall pocket in the front office.  Westley Chandler, MD

## 2023-03-18 NOTE — Progress Notes (Deleted)
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 She can have oppositional behavior but that has improved over time.    Seizures have been well controlled for years on the combination of Carbatrol and gabapentin which he tolerates without side effects.  She is totally dependent on others for dressing and bathing changing her diaper, is unable to feed herself or prepare meals. She has had problems with weight loss and is being followed by the dietician.   Due to Kristin Fitzgerald's medical condition, she is indefinitely incontinent of stool and urine.  It is medically necessary for her to use diapers, underpads, and gloves to assist with hygiene and skin integrity.     Today's concerns: She  Kristin Fitzgerald has been otherwise generally healthy since she was last seen. No health concerns today other than previously mentioned.  Review of systems: Please see HPI for neurologic and other pertinent review of systems. Otherwise all other systems were reviewed and were negative.  Problem List: Patient Active Problem List   Diagnosis Date Noted   Need for assistance with personal care 03/06/2023   Increased nutritional needs [R63.8] 06/25/2022   Underweight [R63.6] 06/25/2022   Inadequate oral intake [R63.8] 06/25/2022   Full incontinence of feces 06/25/2022   Caregiver stress 12/12/2021   Insomnia  12/12/2021   Impaired nutrition 10/20/2021   Cough 08/03/2021   Loss of weight 08/11/2020   COVID-19 vaccine dose not administered 08/04/2020   Adjustment disorder with emotional disturbance 08/05/2019   Need for immunization against influenza 05/07/2017   Nutrition impaired due to limited access to healthful foods 05/07/2017   Thrush 07/09/2016   Constipation 07/09/2016   Dysphagia 10/25/2015   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   Scoliosis associated with other condition 05/19/2013   Unspecified disorder of eye movements 05/19/2013   Mild intellectual disability 05/19/2013   Unspecified urinary incontinence 05/19/2013   Unspecified congenital cataract 05/19/2013   Cerebral palsy (HCC) 07/06/2011   Seizure disorder (HCC) 07/06/2011   Heart murmur 07/06/2011   Healthcare maintenance 07/06/2011     Past Medical History:  Diagnosis Date   Cerebral palsy (HCC)    Mild intellectual disabilities    Seizure disorder (HCC)    Seizures (HCC)     Past medical history comments: See HPI Copied from previous record: Birth History 3 lbs. 1 oz. Infant born at [redacted] weeks gestational age   Gestation was complicated by premature delivery for unknown reasons. Nursery Course was complicated by seizures a day 3 of life.  The patient had no hemorrhage but had periventricular leukomalacia. 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 She has 76 CAPS hours per week. Her mother works in childcare.  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   She lives with her mother and her brother Kristin Fitzgerald.   Social Determinants of Health   Financial Resource Strain: Not on file  Food Insecurity: No Food Insecurity (12/21/2021)   Hunger Vital Sign    Worried About Running Out of Food in the Last Year: Never true    Ran Out of Food in the Last Year: Never true  Transportation Needs: No Transportation Needs (12/21/2021)   PRAPARE - Administrator, Civil Service (Medical): No    Lack of Transportation (Non-Medical): No  Physical Activity: Not on file  Stress: Not on file  Social Connections: Not on file  Intimate Partner Violence: Not on file    Past/failed meds:  Allergies: Allergies  Allergen Reactions   Demerol Nausea And Vomiting    Immunizations: Immunization History  Administered Date(s) Administered   Hepatitis B 07/06/2011, 11/07/2011   Influenza Split 07/06/2011, 05/31/2012   Influenza, Seasonal, Injecte,  Preservative Fre 03/06/2023   Influenza,inj,Quad PF,6+ Mos 04/16/2013, 04/21/2014, 06/07/2015, 07/07/2016, 05/07/2017, 04/30/2018, 03/05/2019, 05/12/2020    Diagnostics/Screenings: Copied from previous record: 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   Physical Exam: LMP 03/04/2023   General: small for age but well developed, well nourished woman, seated in wheelchair, in no evident distress Head: normocephalic and atraumatic. Oropharynx difficult to examine but appears benign. No dysmorphic features. Neck: supple Cardiovascular: regular rate and rhythm, no murmurs. Respiratory: clear to auscultation bilaterally Abdomen: bowel sounds present all four quadrants, abdomen soft, non-tender, non-distended. No hepatosplenomegaly or masses palpated.Gastrostomy tube in place size  Fr cm AMT MiniOne balloon button, site clean and dry Musculoskeletal: no skeletal deformities. Has severe convex left neuromuscular scoliosis. Has contractures at the right elbow, both knees and tight heel cords. Has right hemiatrophy upper greater than lower extremities Skin: no rashes or neurocutaneous lesions  Neurologic Exam Mental Status: awake and fully alert. Has very limited language.  Smiles responsively. Unable to follow instructions or participate in examination Cranial Nerves: fundoscopic exam - red reflex present.  Unable to fully visualize fundus.  Pupils equal briskly reactive to light.  Turns to localize faces and objects in the periphery. Turns to localize sounds in the periphery. Facial movements are symmetric. Motor: spastic quadriparesis. Poor fine motor movements. Sensory: withdrawal x 4 Coordination: unable to adequately assess due to patient's inability to participate in examination. *** with reach for objects. Gait and Station: unable to independently stand and bear weight.  Reflexes: diminished and symmetric. Toes  neutral. No clonus   Impression: No diagnosis found.    Recommendations for plan of care: The patient's previous Epic records were reviewed. No recent diagnostic studies to be reviewed with the patient.  Plan until next visit: Continue medications as prescribed  Call for questions or concerns No follow-ups on file.  The medication list was reviewed and reconciled. No changes were made in the prescribed medications today. A complete medication list was provided to the patient.  No orders of the defined types were placed in this encounter.    Allergies as of 03/19/2023       Reactions   Demerol Nausea And Vomiting        Medication List        Accurate as of March 18, 2023 11:28 AM. If you have any questions, ask your nurse or doctor.          carbamazepine 200 MG 12 hr capsule Commonly known as: CARBATROL TAKE 1 CAPSULE BY MOUTH TWICE A DAY   cetirizine HCl 5 MG/5ML Syrp Commonly known as: Zyrtec Take 10 mLs (10 mg total) by mouth daily.   gabapentin 100 MG capsule Commonly known as: NEURONTIN TAKE 1 CAPSULE IN THE MORNING AND 2 CAPSULES AT BEDTIME   Nutritional Supplement Plus Liqd 2 Boost Plus (chocolate only) or high-calorie (1.5 kcal/oz, chocolate only) alternative given PO daily.      Total time spent with the patient was *** minutes, of which 50% or more was spent in counseling and coordination of care.  Elveria Rising NP-C [facility] [address] [place], [place] [address] Ph. [phone] Fax [phone]

## 2023-03-19 ENCOUNTER — Ambulatory Visit (INDEPENDENT_AMBULATORY_CARE_PROVIDER_SITE_OTHER): Payer: MEDICAID | Admitting: Family

## 2023-04-13 ENCOUNTER — Other Ambulatory Visit (INDEPENDENT_AMBULATORY_CARE_PROVIDER_SITE_OTHER): Payer: Self-pay | Admitting: Family

## 2023-04-13 DIAGNOSIS — G40209 Localization-related (focal) (partial) symptomatic epilepsy and epileptic syndromes with complex partial seizures, not intractable, without status epilepticus: Secondary | ICD-10-CM

## 2023-04-13 DIAGNOSIS — G40309 Generalized idiopathic epilepsy and epileptic syndromes, not intractable, without status epilepticus: Secondary | ICD-10-CM

## 2023-04-13 NOTE — Telephone Encounter (Signed)
Last OV 06/22/2022 Multiple cancellations No follow up scheduled Last filled 01/01/2023 90 d no refills

## 2023-06-06 ENCOUNTER — Encounter (INDEPENDENT_AMBULATORY_CARE_PROVIDER_SITE_OTHER): Payer: Self-pay

## 2023-07-11 ENCOUNTER — Other Ambulatory Visit (INDEPENDENT_AMBULATORY_CARE_PROVIDER_SITE_OTHER): Payer: Self-pay | Admitting: Family

## 2023-07-11 DIAGNOSIS — G40309 Generalized idiopathic epilepsy and epileptic syndromes, not intractable, without status epilepticus: Secondary | ICD-10-CM

## 2023-07-11 DIAGNOSIS — G40209 Localization-related (focal) (partial) symptomatic epilepsy and epileptic syndromes with complex partial seizures, not intractable, without status epilepticus: Secondary | ICD-10-CM

## 2023-07-12 ENCOUNTER — Ambulatory Visit (INDEPENDENT_AMBULATORY_CARE_PROVIDER_SITE_OTHER): Payer: MEDICAID | Admitting: Family

## 2023-07-22 NOTE — Progress Notes (Deleted)
 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 She can have oppositional behavior but that has improved over time.    Seizures have been well controlled for years on the combination of Carbatrol and gabapentin which he tolerates without side effects.  She is totally dependent on others for dressing and bathing changing her diaper, is unable to feed herself or prepare meals. She has had problems with weight loss and is being followed by the dietician.   Due to Kristin Fitzgerald's medical condition, she is indefinitely incontinent of stool and urine.  It is medically necessary for her to use diapers, underpads, and gloves to assist with hygiene and skin integrity.     Today's concerns: She  Mits[place]o has been otherwise generally healthy since she was last seen. No health concerns today other than previously mentioned.  Review of systems: Please see HPI for neurologic and other pertinent review of systems. Otherwise all other systems were reviewed and were negative.  Problem List: Patient Active Problem List   Diagnosis Date Noted   Need for assistance with personal care 03/06/2023   Increased nutritional needs [R63.8] 06/25/2022   Underweight [R63.6] 06/25/2022   Inadequate oral intake [R63.8] 06/25/2022   Full incontinence of feces 06/25/2022   Caregiver stress 12/12/2021   Insomnia 12/12/2021    Impaired nutrition 10/20/2021   Cough 08/03/2021   Loss of weight 08/11/2020   COVID-19 vaccine dose not administered 08/04/2020   Adjustment disorder with emotional disturbance 08/05/2019   Need for immunization against influenza 05/07/2017   Nutrition impaired due to limited access to healthful foods 05/07/2017   Thrush 07/09/2016   Constipation 07/09/2016   Dysphagia 10/25/2015   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 1 oz. Infant born at [redacted] weeks gestational age   Gestation was complicated by premature delivery for unknown reasons. Nursery Course was complicated by seizures a day 3 of life.  The patient had no hemorrhage but had periventricular leukomalacia. 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 She has 76 CAPS hours per week. Her mother works in childcare.  Surgical history: Past Surgical History:  Procedure Laterality Date   CARDIAC SURGERY     Repaired valve in 2007-[place]  EYE SURGERY       bilateral eye surgery in infancy   HIP ARTHROPLASTY     for recurrent dislocation; rebuilt acetabulum   INGUINAL HERNIA  REPAIR     KIDNEY SURGERY      kidney/bladder surgery in infancy for "blockage"   OTHER SURGICAL HISTORY     [place] [facility] as an infant    Family history: family history includes Asthma in her brother; Breast cancer in her maternal aunt; Kidney failure in her maternal grandmother; Leukemia in her maternal grandfather.   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   She lives with her mother and her brother Berna Spare.   Social Drivers of Corporate investment banker Strain: Not on file  Food Insecurity: No Food Insecurity (12/21/2021)   Hunger Vital Sign    Worried About Running Out of Food in the Last Year: Never true    Ran Out of Food in the Last Year: Never true  Transportation Needs: No Transportation Needs (12/21/2021)   PRAPARE - Administrator, Civil Service (Medical): No    Lack of Transportation (Non-Medical): No  Physical Activity: Not on file  Stress: Not on file  Social Connections: Not on file  Intimate Partner Violence: Not on file    Past/failed meds:  Allergies: Allergies  Allergen Reactions   Demerol Nausea And Vomiting    Immunizations: Immunization History  Administered Date(s) Administered   Hepatitis B 07/06/2011, 11/07/2011   Influenza Split 07/06/2011, 05/31/2012   Influenza, Seasonal, Injecte, Preservative Fre 03/06/2023   Influenza,inj,Quad PF,6+  Mos 04/16/2013, 04/21/2014, 06/07/2015, 07/07/2016, 05/07/2017, 04/30/2018, 03/05/2019, 05/12/2020    Diagnostics/Screenings: Copied from previous record: 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  Physical Exam: There were no vitals taken for this visit.  General: well developed, well nourished, seated, in no evident distress Head: normocephalic and atraumatic. Oropharynx difficult to examine but appears benign. No dysmorphic features. Neck: supple Cardiovascular: regular rate and rhythm, no murmurs. Respiratory: clear to auscultation bilaterally Abdomen: bowel sounds present all four quadrants, abdomen soft, non-tender, non-distended. No hepatosplenomegaly or masses palpated.Gastrostomy tube in place size  Fr cm AMT MiniOne balloon button, site clean and dry Musculoskeletal: no skeletal deformities or obvious scoliosis. Has contractures**** Skin: no rashes or neurocutaneous lesions  Neurologic Exam Mental Status: awake and fully alert. Has no language.  Smiles responsively. Unable to follow instructions or participate in examination Cranial Nerves: fundoscopic exam - red reflex present.  Unable to fully visualize fundus.  Pupils equal briskly reactive to light.  Turns to localize faces and objects in the periphery. Turns to localize sounds in the periphery. Facial movements are asymmetric, has lower facial weakness  with drooling.  Neck flexion and extension *** abnormal with poor head control.  Motor: truncal hypotonia.  *** spastic quadriparesis  Sensory: withdrawal x 4 Coordination: unable to adequately assess due to patient's inability to participate in examination. *** with reach for objects. Gait and Station: unable to independently stand and bear weight. Able to stand with assistance but needs constant support. Able to take a few steps but has poor balance and needs support.  Reflexes: diminished and  symmetric. Toes neutral. No clonus   Impression: No diagnosis found.    Recommendations for plan of care: The patient's previous Epic records were reviewed. No recent diagnostic studies to be reviewed with the patient.  Plan until next visit: Continue medications as prescribed  Call for questions or concerns No follow-ups on file.  The medication list was reviewed and reconciled. No changes were made in the prescribed medications today. A complete medication list was provided to the patient.  No orders of the defined types were placed in this encounter.    Allergies as of 07/23/2023       Reactions   Demerol Nausea And Vomiting        Medication List        Accurate as of July 22, 2023  2:34 PM. If you have any questions, ask your nurse or doctor.          carbamazepine 200 MG 12 hr capsule Commonly known as: CARBATROL TAKE 1 CAPSULE BY MOUTH TWICE A DAY   cetirizine HCl 5 MG/5ML Syrp Commonly known as: Zyrtec Take 10 mLs (10 mg total) by mouth daily.   gabapentin 100 MG capsule Commonly known as: NEURONTIN TAKE 1 CAPSULE IN THE MORNING AND 2 CAPSULES AT BEDTIME   Nutritional Supplement Plus Liqd 2 Boost Plus (chocolate only) or high-calorie (1.5 kcal/oz, chocolate only) alternative given PO daily.            I discussed this patient's care with the multiple providers involved in her care today to develop this assessment and plan.   Total time spent with the patient was *** minutes, of which 50% or more was spent in counseling and coordination of care.  Elveria Rising NP-C [facility] [address] [place], [place] [address] Ph. [phone] Fax [phone]

## 2023-07-23 ENCOUNTER — Ambulatory Visit (INDEPENDENT_AMBULATORY_CARE_PROVIDER_SITE_OTHER): Payer: MEDICAID | Admitting: Family

## 2023-07-26 ENCOUNTER — Telehealth (INDEPENDENT_AMBULATORY_CARE_PROVIDER_SITE_OTHER): Payer: MEDICAID | Admitting: Dietician

## 2023-07-30 ENCOUNTER — Ambulatory Visit (HOSPITAL_COMMUNITY)
Admission: EM | Admit: 2023-07-30 | Discharge: 2023-07-30 | Disposition: A | Discharge status: Home / Self Care | Payer: MEDICAID

## 2023-07-30 ENCOUNTER — Encounter (HOSPITAL_COMMUNITY): Payer: Self-pay | Admitting: *Deleted

## 2023-07-30 DIAGNOSIS — Z711 Person with feared health complaint in whom no diagnosis is made: Secondary | ICD-10-CM

## 2023-07-30 NOTE — ED Provider Notes (Signed)
[facility]    CSN: [id number] Arrival date & time: 07/30/23  1524      History   Chief Complaint Chief Complaint  Patient presents with   Cough   Chest Pain    HPI Kristin Fitzgerald is a 33 y.o. female.  Here with mom This morning she coughed 2-3 times and then was complaining of chest pain. This lasted less than an hour. Had been around someone who was coughing but they found out that was asthma Has not had fever, sore throat, congestion, abd pain, NVD, rash Not having symptoms currently   Past Medical History:  Diagnosis Date   Cerebral palsy (HCC)    Mild intellectual disabilities    Seizure disorder (HCC)    Seizures (HCC)     Patient Active Problem List   Diagnosis Date Noted   Need for assistance with personal care 03/06/2023   Increased nutritional needs [R63.8] 06/25/2022   Underweight [R63.6] 06/25/2022   Inadequate oral intake [R63.8] 06/25/2022   Full incontinence of feces 06/25/2022   Caregiver stress 12/12/2021   Insomnia 12/12/2021   Impaired nutrition 10/20/2021   Cough 08/03/2021   Loss of weight 08/11/2020   COVID-19 vaccine dose not administered 08/04/2020   Adjustment disorder with emotional disturbance 08/05/2019   Need for immunization against influenza 05/07/2017   Nutrition impaired due to limited access to healthful foods 05/07/2017   Thrush 07/09/2016   Constipation 07/09/2016   Dysphagia 10/25/2015   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   Scoliosis associated with other condition 05/19/2013   Disorder of eye movements 05/19/2013   Mild intellectual disability 05/19/2013   Unspecified urinary incontinence 05/19/2013   Congenital cataract 05/19/2013   Cerebral palsy (HCC) 07/06/2011   Seizure disorder (HCC) 07/06/2011   Heart murmur  07/06/2011   Healthcare maintenance 07/06/2011    Past Surgical History:  Procedure Laterality Date   CARDIAC SURGERY     Repaired valve in 2007-[place]   EYE SURGERY       bilateral eye surgery in infancy   HIP ARTHROPLASTY     for recurrent dislocation; rebuilt acetabulum   INGUINAL HERNIA REPAIR     KIDNEY SURGERY      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      Home Medications    Prior to Admission medications   Medication Sig Start Date End Date Taking? Authorizing Provider  carbamazepine (CARBATROL) 200 MG 12 hr capsule TAKE 1 CAPSULE BY MOUTH TWICE A DAY 07/11/23  Yes Holland Falling, NP  gabapentin (NEURONTIN) 100 MG capsule TAKE 1 CAPSULE IN THE MORNING AND 2 CAPSULES AT BEDTIME 04/17/23  Yes Elveria Ivis Henneman, NP  Nutritional Supplements (NUTRITIONAL SUPPLEMENT PLUS) LIQD 2 Boost Plus (chocolate only) or high-calorie (1.5 kcal/oz, chocolate only) alternative given PO daily. 03/16/22  Yes Elveria Archer Moist, NP  cetirizine HCl (ZYRTEC) 5 MG/5ML SYRP Take 10 mLs (10 mg total) by mouth daily. 11/01/15   Erasmo Downer, MD    Family History Family History  Problem Relation Age of Onset   Asthma Brother    Kidney failure Maternal Grandmother        Died at 41   Leukemia Maternal Grandfather  Died at 29   Breast cancer Maternal Aunt        Dx in 2010, In remission    Social History Social History   Tobacco Use   Smoking status: Never    Passive exposure: Yes   Smokeless tobacco: Never   Tobacco comments:    Mother and brother smoke   Vaping Use   Vaping status: Never Used  Substance Use Topics   Alcohol use: Never   Drug use: Never     Allergies   Demerol   Review of Systems Review of Systems Per HPI  Physical Exam Triage Vital Signs ED Triage Vitals  Encounter Vitals Group     BP 07/30/23 1737 131/73      Systolic BP Percentile --      Diastolic BP Percentile --      Pulse Rate 07/30/23 1737 89     Resp 07/30/23 1737 20     Temp 07/30/23 1737 98.7 F (37.1 C)     Temp Source 07/30/23 1737 Oral     SpO2 07/30/23 1737 98 %     Weight --      Height --      Head Circumference --      Peak Flow --      Pain Score 07/30/23 1736 0     Pain Loc --      Pain Education --      Exclude from Growth Chart --    No data found.  Updated Vital Signs BP 131/73 (BP Location: Left Arm)   Pulse 89   Temp 98.7 F (37.1 C) (Oral)   Resp 20   LMP 07/30/2023 (Exact Date)   SpO2 98%    Physical Exam Vitals and nursing note reviewed.  Constitutional:      General: She is not in acute distress. HENT:     Nose: No rhinorrhea.     Mouth/Throat:     Mouth: Mucous membranes are moist.     Pharynx: Oropharynx is clear. No posterior oropharyngeal erythema.  Eyes:     Conjunctiva/sclera: Conjunctivae normal.  Cardiovascular:     Rate and Rhythm: Normal rate and regular rhythm.     Pulses: Normal pulses.     Heart sounds: Normal heart sounds.  Pulmonary:     Effort: Pulmonary effort is normal. No respiratory distress.     Breath sounds: Normal breath sounds. No wheezing or rales.  Musculoskeletal:     Cervical back: Normal range of motion.  Lymphadenopathy:     Cervical: No cervical adenopathy.  Skin:    General: Skin is warm and dry.  Neurological:     Mental Status: She is alert and oriented to person, place, and time.      UC Treatments / Results  Labs (all labs ordered are listed, but only abnormal results are displayed) Labs Reviewed - No data to display  EKG   Radiology No results found.  Procedures Procedures (including critical care time)  Medications Ordered in UC Medications - No data to display  Initial Impression / Assessment and Plan / UC Course  I have reviewed the triage vital signs and the nursing notes.  Pertinent labs & imaging results that were available  during my care of the patient were reviewed by me and considered in my medical decision making (see chart for details).  Afebrile, unremarkable exam Symptoms this morning for about an hour Not having chest pain or cough anymore Discussed symptoms with mom and what  OTC meds she can use if viral illness develops Otherwise no concerns at this time  Final Clinical Impressions(s) / UC Diagnoses   Final diagnoses:  Worried well     Discharge Instructions      If she develops cough that persists, you can use either Robitussin or Delsym syrup Keep washing hands!    ED Prescriptions   None    PDMP not reviewed this encounter.   Marlow Baars, [place] 07/30/23 1610

## 2023-07-30 NOTE — Discharge Instructions (Signed)
If she develops cough that persists, you can use either Robitussin or Delsym syrup Keep washing hands!

## 2023-07-30 NOTE — ED Triage Notes (Signed)
Pts mom states that she has cough and complaining of chest pain with cough only. Sx started today, she hasn't had any meds for sx.

## 2023-08-02 ENCOUNTER — Telehealth: Payer: Self-pay

## 2023-08-02 NOTE — Telephone Encounter (Signed)
 Mother calls nurse line again in regards to OTC medications.   She reports she gave her Childrens Dimetapp at 10am this morning. She reports she plans to transition to [place] after dinner time.   Agreed with mothers plan.

## 2023-08-02 NOTE — Telephone Encounter (Signed)
 Mother calls nurse line regarding concerns with patient experiencing congestion and slight cough. Denies fever.   They are already using a humidifier to help with congestion.   Discussed supportive measures. Advised that if patient is not improving by next week to give us  a call back.   Mother verbalizes understanding.   Chiquita JAYSON English, RN

## 2023-08-03 ENCOUNTER — Telehealth: Payer: Self-pay

## 2023-08-03 NOTE — Telephone Encounter (Signed)
 Patients mother calls nurse line reporting ear pain.   She reports symptoms started ~3pm this afternoon with bilateral ear pain. She reports he patient has been complaining for over an hour about having ear pain.   She denies any redness or swelling to the ears. She denies any fevers or draingage.  The patient has been congested and she reports responding well to Mucinex.  Mother reports she does not want to have to take her to the ED or UC.  Mother advised to give Motrin  for discomfort and monitor.   Advised will forward to PCP.

## 2023-08-04 ENCOUNTER — Other Ambulatory Visit (INDEPENDENT_AMBULATORY_CARE_PROVIDER_SITE_OTHER): Payer: Self-pay | Admitting: Pediatrics

## 2023-08-04 DIAGNOSIS — G40209 Localization-related (focal) (partial) symptomatic epilepsy and epileptic syndromes with complex partial seizures, not intractable, without status epilepticus: Secondary | ICD-10-CM

## 2023-08-04 DIAGNOSIS — G40309 Generalized idiopathic epilepsy and epileptic syndromes, not intractable, without status epilepticus: Secondary | ICD-10-CM

## 2023-08-06 NOTE — Telephone Encounter (Signed)
 Last OV 06/22/2022 No showed 12/18/22, 03/08/23, 03/19/23 Next OV 09/20/22  Rx last 07/11/2023  0 rf last dispensed 07/15/2023 Routed to provider to determine if she wants to refill since last OV was over 1 yr ago

## 2023-08-23 ENCOUNTER — Other Ambulatory Visit (INDEPENDENT_AMBULATORY_CARE_PROVIDER_SITE_OTHER): Payer: Self-pay | Admitting: Family

## 2023-08-23 DIAGNOSIS — G40209 Localization-related (focal) (partial) symptomatic epilepsy and epileptic syndromes with complex partial seizures, not intractable, without status epilepticus: Secondary | ICD-10-CM

## 2023-08-23 DIAGNOSIS — G40309 Generalized idiopathic epilepsy and epileptic syndromes, not intractable, without status epilepticus: Secondary | ICD-10-CM

## 2023-09-20 ENCOUNTER — Encounter (INDEPENDENT_AMBULATORY_CARE_PROVIDER_SITE_OTHER): Payer: Self-pay | Admitting: Family

## 2023-09-20 ENCOUNTER — Ambulatory Visit (INDEPENDENT_AMBULATORY_CARE_PROVIDER_SITE_OTHER): Payer: MEDICAID | Admitting: Family

## 2023-09-20 VITALS — BP 122/64 | HR 94 | Wt 75.0 lb

## 2023-09-20 DIAGNOSIS — G40309 Generalized idiopathic epilepsy and epileptic syndromes, not intractable, without status epilepticus: Secondary | ICD-10-CM | POA: Diagnosis not present

## 2023-09-20 DIAGNOSIS — R638 Other symptoms and signs concerning food and fluid intake: Secondary | ICD-10-CM | POA: Diagnosis not present

## 2023-09-20 DIAGNOSIS — G808 Other cerebral palsy: Secondary | ICD-10-CM

## 2023-09-20 DIAGNOSIS — E639 Nutritional deficiency, unspecified: Secondary | ICD-10-CM

## 2023-09-20 DIAGNOSIS — R634 Abnormal weight loss: Secondary | ICD-10-CM | POA: Diagnosis not present

## 2023-09-20 DIAGNOSIS — F4329 Adjustment disorder with other symptoms: Secondary | ICD-10-CM

## 2023-09-20 MED ORDER — CARBAMAZEPINE ER 200 MG PO CP12: 200.0000 mg | ORAL_CAPSULE | Freq: Two times a day (BID) | ORAL | 5 refills | Status: DC

## 2023-09-20 MED ORDER — GABAPENTIN 100 MG PO CAPS
ORAL_CAPSULE | ORAL | 5 refills | Status: DC
Start: 2023-09-20 — End: 2024-02-21

## 2023-09-20 NOTE — Patient Instructions (Addendum)
 It was a pleasure to see you today!  Instructions for you until your next appointment are as follows: Continue giving the seizure medications as prescribed Let me know if Merrianne has any seizures I will send an updated order to [facility] for the higher calorie Boost. Give her 1 carton twice per day. Let me know if you want to try a medication for her mood Please sign up for MyChart if you have not done so. Please plan to return for follow up in 1 year or sooner if needed.  Feel free to contact our office during normal business hours at [phone] with questions or concerns. If there is no answer or the call is outside business hours, please leave a message and our clinic staff will call you back within the next business day.  If you have an urgent concern, please stay on the line for our after-hours answering service and ask for the on-call neurologist.     I also encourage you to use MyChart to communicate with me more directly. If you have not yet signed up for MyChart within [facility], the front desk staff can help you. However, please note that this inbox is NOT monitored on nights or weekends, and response can take up to 2 business days.  Urgent matters should be discussed with the on-call pediatric neurologist.   At [facility], we are committed to providing exceptional care. You will receive a patient satisfaction survey through text or email regarding your visit today. Your opinion is important to me. Comments are appreciated.

## 2023-09-20 NOTE — Progress Notes (Signed)
 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 She can have oppositional behavior but that has improved over time.    Seizures have been well controlled for years on the combination of Carbatrol and gabapentin which he tolerates without side effects.  She is totally dependent on others for dressing and bathing changing her diaper, is unable to feed herself or prepare meals. She has had problems with weight loss and is being followed by the dietician.   Due to Kristin Fitzgerald's medical condition, she is indefinitely incontinent of stool and urine.  It is medically necessary for her to use diapers, underpads, and gloves to assist with hygiene and skin integrity.     Today's concerns: Her family reports that she has remained seizure free since her last visit. Mom reports that the Boost that she receives was switched to "regular" and that she used to receive the very high calorie formulation. Mom is unsure why this change occurred.  Her family reports that Kristin Fitzgerald sometimes is quite irritable, with angry mood and lash out at them. She is fairly intolerant of change and her behavior worsens when that occurs.  Kristin Fitzgerald has been otherwise generally healthy since she was last seen. No health concerns today other than previously mentioned.  Review of systems: Please see HPI for  neurologic and other pertinent review of systems. Otherwise all other systems were reviewed and were negative.  Problem List: Patient Active Problem List   Diagnosis Date Noted   Need for assistance with personal care 03/06/2023   Increased nutritional needs [R63.8] 06/25/2022   Underweight [R63.6] 06/25/2022   Inadequate oral intake [R63.8] 06/25/2022   Full incontinence of feces 06/25/2022   Caregiver stress 12/12/2021   Insomnia 12/12/2021   Impaired nutrition 10/20/2021   Cough 08/03/2021   Loss of weight 08/11/2020   COVID-19 vaccine dose not administered 08/04/2020   Adjustment disorder with emotional disturbance 08/05/2019   Need for immunization against influenza 05/07/2017   Nutrition impaired due to limited access to healthful foods 05/07/2017   Thrush 07/09/2016   Constipation 07/09/2016   Dysphagia 10/25/2015   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 1 oz. Infant born at [redacted] weeks gestational age   Gestation was complicated by premature delivery for unknown reasons.  Nursery Course was complicated by seizures a day 3 of life.  The patient had no hemorrhage but had periventricular leukomalacia. 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 She has 76 CAPS hours per week. Her mother works in childcare.  Surgical history: Past Surgical History:  Procedure Laterality Date   CARDIAC SURGERY     Repaired valve in 2007-[place]   EYE SURGERY       bilateral eye surgery in infancy   HIP ARTHROPLASTY     for recurrent dislocation; rebuilt acetabulum   INGUINAL HERNIA REPAIR     KIDNEY SURGERY      kidney/bladder surgery in infancy for "blockage"   OTHER SURGICAL HISTORY     [place] [facility] as an infant     Family history: family history includes Asthma in her brother; Breast cancer in her maternal aunt; Kidney failure in her maternal grandmother; Leukemia in her maternal grandfather.   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   She lives with her mother and her brother Kristin Fitzgerald.   Social Drivers of Corporate investment banker Strain: Not on file  Food Insecurity: No Food Insecurity (12/21/2021)   Hunger Vital Sign    Worried About Running Out of Food in the Last Year: Never true    Ran Out of Food in the Last Year: Never true  Transportation Needs: No Transportation Needs (12/21/2021)   PRAPARE - Administrator, Civil Service (Medical): No    Lack of Transportation (Non-Medical): No  Physical  Activity: Not on file  Stress: Not on file  Social Connections: Not on file  Intimate Partner Violence: Not on file    Past/failed meds:  Allergies: Allergies  Allergen Reactions   Demerol Nausea And Vomiting    Immunizations: Immunization History  Administered Date(s) Administered   Hepatitis B 07/06/2011, 11/07/2011   Influenza Split 07/06/2011, 05/31/2012   Influenza, Seasonal, Injecte, Preservative Fre 03/06/2023   Influenza,inj,Quad PF,6+ Mos 04/16/2013, 04/21/2014, 06/07/2015, 07/07/2016, 05/07/2017, 04/30/2018, 03/05/2019, 05/12/2020    Diagnostics/Screenings: Copied from previous record: 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   Physical Exam: BP 122/64 (BP Location: Right Arm, Patient Position: Sitting, Cuff Size: Small)   Pulse 94   Wt 75 lb (34 kg)   LMP 09/20/2023 (Exact Date)   BMI 18.77 kg/m  Wt Readings from Last 3 Encounters:  09/20/23 75 lb (34 kg)  06/22/22 71 lb (32.2 kg)  06/22/22 71 lb (32.2 kg)     General: small for age, thin but well developed, well nourished woman, seated in wheelchair, in no evident distress Head: normocephalic  and atraumatic. Oropharynx difficult to examine but appears benign. No dysmorphic features. Neck: supple Cardiovascular: regular rate and rhythm, no murmurs. Respiratory: clear to auscultation bilaterally Abdomen: bowel sounds present all four quadrants, abdomen soft, non-tender, non-distended.  Musculoskeletal: no skeletal deformities. Has severe convex left neuromuscular scoliosis. Has contractures at right elbow, both knees and tight heel cords. Has right hemiatrophy upper greater than lower extremities Skin: no rashes or neurocutaneous lesions  Neurologic Exam Mental Status: awake and fully alert. Has limited language.  Smiles responsively. Unable to follow instructions or participate in examination.  Cranial Nerves: fundoscopic  exam - red reflex present.  Unable to fully visualize fundus.  Pupils equal briskly reactive to light.  Turns to localize faces and objects in the periphery. Turns to localize sounds in the periphery. Facial movements are symmetric. Motor: spastic quadriparesis  Sensory: withdrawal x 4 Coordination: unable to adequately assess due to patient's inability to participate in examination. Has dysmetria with reach for objects. Gait and Station: unable to stand and bear weight.   Impression: Generalized convulsive epilepsy (HCC) - Plan: carbamazepine (CARBATROL) 200 MG 12 hr capsule, gabapentin (NEURONTIN) 100 MG capsule, For home use only DME Other see comment  Nutrition impaired due to limited access to healthful foods - Plan: For home use only DME Other see comment  Loss of weight - Plan: For home use only DME Other see comment  Impaired nutrition - Plan: For home use only DME Other see comment  Increased nutritional needs [R63.8] - Plan: For home use only DME Other see comment  Inadequate oral intake [R63.8] - Plan: For home use only DME Other see comment  Congenital quadriplegia (HCC) - Plan: For home use only DME Other see comment  Adjustment disorder with emotional disturbance   Recommendations for plan of care: The patient's previous Epic records were reviewed. No recent diagnostic studies to be reviewed with the patient.  Plan until next visit: Continue medications as prescribed  Updated order for Boost sent to [facility] Can consider a SSRI for mood if desired Call for questions or concerns Return in about 1 year (around 09/19/2024).  The medication list was reviewed and reconciled. No changes were made in the prescribed medications today. A complete medication list was provided to the patient.  Orders Placed This Encounter  Procedures   For home use only DME Other see comment    Give 2 Boost very high-calorie (1.5 kcal/oz, chocolate only) PO daily. Dispense 60 cartons per month x  12 months    Length of Need:   12 Months    Allergies as of 09/20/2023       Reactions   Demerol Nausea And Vomiting        Medication List        Accurate as of September 20, 2023 11:59 PM. If you have any questions, ask your nurse or doctor.          carbamazepine 200 MG 12 hr capsule Commonly known as: CARBATROL Take 1 capsule (200 mg total) by mouth 2 (two) times daily.   cetirizine HCl 5 MG/5ML Syrp Commonly known as: Zyrtec Take 10 mLs (10 mg total) by mouth daily.   gabapentin 100 MG capsule Commonly known as: NEURONTIN TAKE 1 CAPSULE IN THE MORNING AND 2 CAPSULES AT BEDTIME   Nutritional Supplement Plus Liqd 2 Boost Plus (chocolate only) or high-calorie (1.5 kcal/oz, chocolate only) alternative given PO daily.  Durable Medical Equipment  (From admission, onward)           Start     Ordered   09/20/23 0000  For home use only DME Other see comment       Comments: Give 2 Boost very high-calorie (1.5 kcal/oz, chocolate only) PO daily. Dispense 60 cartons per month x 12 months  Question:  Length of Need  Answer:  12 Months   09/20/23 1428          Total time spent with the patient was 30 minutes, of which 50% or more was spent in counseling and coordination of care.  Kristin Rising NP-C [facility] [address], [address] [place], [place] [address] Ph. [phone] Fax [phone]

## 2023-09-22 ENCOUNTER — Encounter (INDEPENDENT_AMBULATORY_CARE_PROVIDER_SITE_OTHER): Payer: Self-pay | Admitting: Family

## 2023-12-01 ENCOUNTER — Ambulatory Visit
Admission: RE | Admit: 2023-12-01 | Discharge: 2023-12-01 | Disposition: A | Discharge status: Home / Self Care | Payer: MEDICAID | Source: Ambulatory Visit | Attending: Internal Medicine

## 2023-12-01 ENCOUNTER — Ambulatory Visit: Payer: MEDICAID

## 2023-12-01 VITALS — BP 154/71 | HR 88 | Temp 97.9°F | Resp 16

## 2023-12-01 DIAGNOSIS — K047 Periapical abscess without sinus: Secondary | ICD-10-CM | POA: Diagnosis not present

## 2023-12-01 MED ORDER — AMOXICILLIN-POT CLAVULANATE 400-57 MG/5ML PO SUSR: 45.0000 mg/kg/d | Freq: Two times a day (BID) | ORAL | 0 refills | Status: AC

## 2023-12-01 NOTE — ED Triage Notes (Signed)
 Mom states pt having swelling to the left side of her face and states she had some bleeding from her mouth yesterday.

## 2023-12-01 NOTE — ED Provider Notes (Signed)
 [facility]    CSN: [id number] Arrival date & time: 12/01/23  1301      History   Chief Complaint Chief Complaint  Patient presents with   Dental Pain    HPI Kristin Fitzgerald is a 33 y.o. female.   Kristin Fitzgerald is a 33 y.o. female with history of neuromuscular scoliosis, cerebral palsy, mild intellectual disability, and seizure disorder presenting with her mother who is her caregiver for chief complaint of Dental Pain that started 2-3 days ago.  Mother noticed some swelling to the left side of the face yesterday and patient has been complaining of pain to the left side of the mouth.  She has been eating normally without vomiting, fever, abdominal pain, rash, sore throat, or complaint of ear pain.  No recent antibiotic or steroid use.  Patient does not have a dentist that she sees regularly. Mother states she has been neurologically intact to her baseline without changes in mental status.she has not attempted use of any OTC medications for pain prior to arrival.    Dental Pain   Past Medical History:  Diagnosis Date   Cerebral palsy (HCC)    Mild intellectual disabilities    Seizure disorder (HCC)    Seizures (HCC)     Patient Active Problem List   Diagnosis Date Noted   Need for assistance with personal care 03/06/2023   Increased nutritional needs [R63.8] 06/25/2022   Underweight [R63.6] 06/25/2022   Inadequate oral intake [R63.8] 06/25/2022   Full incontinence of feces 06/25/2022   Caregiver stress 12/12/2021   Insomnia 12/12/2021   Impaired nutrition 10/20/2021   Cough 08/03/2021   Loss of weight 08/11/2020   COVID-19 vaccine dose not administered 08/04/2020   Adjustment disorder with emotional disturbance 08/05/2019   Need for immunization against influenza 05/07/2017   Nutrition impaired due to limited access to healthful foods 05/07/2017   Thrush 07/09/2016   Constipation 07/09/2016   Dysphagia 10/25/2015   Neuromuscular  scoliosis of thoracolumbar region 10/20/2015   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   Scoliosis associated with other condition 05/19/2013   Disorder of eye movements 05/19/2013   Mild intellectual disability 05/19/2013   Unspecified urinary incontinence 05/19/2013   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      Home Medications    Prior to Admission medications   Medication Sig Start Date End Date Taking? Authorizing Provider  amoxicillin -clavulanate (AUGMENTIN) 400-57 MG/5ML suspension Take 9.6 mLs (768 mg total) by mouth 2 (two) times daily for 7 days. 12/01/23 12/08/23 Yes StanhopeDanny Dye, FNP  carbamazepine  (CARBATROL ) 200 MG 12 hr capsule Take 1 capsule (200 mg total) by mouth 2 (two) times daily. 09/20/23   Lyndol Santee,  NP  cetirizine  HCl (ZYRTEC ) 5 MG/5ML SYRP Take 10 mLs (10 mg total) by mouth daily. 11/01/15   Bacigalupo, Stan Eans, MD  gabapentin  (NEURONTIN ) 100 MG capsule TAKE 1 CAPSULE IN THE MORNING AND 2 CAPSULES AT BEDTIME 09/20/23   Lyndol Santee, NP  Nutritional Supplements (NUTRITIONAL SUPPLEMENT PLUS) LIQD 2 Boost Plus (chocolate only) or high-calorie (1.5 kcal/oz, chocolate only) alternative given PO daily. 03/16/22   Lyndol Santee, NP    Family History Family  History  Problem Relation Age of Onset   Asthma Brother    Kidney failure Maternal Grandmother        Died at 31   Leukemia Maternal Grandfather        Died at 41   Breast cancer Maternal Aunt        Dx in 2010, In remission    Social History Social History   Tobacco Use   Smoking status: Never    Passive exposure: Yes   Smokeless tobacco: Never   Tobacco comments:    Mother and brother smoke   Vaping Use   Vaping status: Never Used  Substance Use Topics   Alcohol use: Never   Drug use: Never     Allergies   Demerol   Review of Systems Review of Systems Per HPI  Physical Exam Triage Vital Signs ED Triage Vitals  Encounter Vitals Group     BP 12/01/23 1336 (!) 154/71     Systolic BP Percentile --      Diastolic BP Percentile --      Pulse Rate 12/01/23 1336 88     Resp 12/01/23 1336 16     Temp 12/01/23 1336 97.9 F (36.6 C)     Temp Source 12/01/23 1336 Oral     SpO2 12/01/23 1336 95 %     Weight --      Height --      Head Circumference --      Peak Flow --      Pain Score 12/01/23 1335 6     Pain Loc --      Pain Education --      Exclude from Growth Chart --    No data found.  Updated Vital Signs BP (!) 154/71 (BP Location: Right Arm)   Pulse 88   Temp 97.9 F (36.6 C) (Oral)   Resp 16   LMP 11/22/2023 (Exact Date)   SpO2 95%   Visual Acuity Right Eye Distance:   Left Eye Distance:   Bilateral Distance:    Right Eye Near:   Left Eye Near:    Bilateral Near:     Physical Exam Vitals and nursing note reviewed.  Constitutional:      Appearance: She is not ill-appearing or toxic-appearing.  HENT:     Head: Normocephalic and atraumatic.     Right Ear: Hearing, tympanic membrane, ear canal and external ear normal.     Left Ear: Hearing, tympanic membrane, ear canal and external ear normal.     Nose: Nose normal.     Mouth/Throat:     Lips: Pink.     Mouth: Mucous membranes are moist. No injury or oral lesions.     Dentition:  Abnormal dentition. Does not have dentures. Dental caries present. No dental tenderness, gingival swelling, dental abscesses or gum lesions.     Tongue: No lesions.     Pharynx: Oropharynx is clear. Uvula midline. No pharyngeal swelling, oropharyngeal exudate, posterior oropharyngeal erythema, uvula swelling or postnasal drip.  Tonsils: No tonsillar exudate.      Comments: No palpable dental abscesses. Maintaining secretions without difficulty.  Eyes:     General: Lids are normal. Vision grossly intact. Gaze aligned appropriately.     Extraocular Movements: Extraocular movements intact.     Conjunctiva/sclera: Conjunctivae normal.  Neck:     Trachea: Trachea and phonation normal.  Pulmonary:     Effort: Pulmonary effort is normal.  Musculoskeletal:     Cervical back: Normal range of motion and neck supple.  Lymphadenopathy:     Cervical: Cervical adenopathy present.     Right cervical: Superficial cervical adenopathy and deep cervical adenopathy present.     Left cervical: Superficial cervical adenopathy and deep cervical adenopathy present.  Skin:    General: Skin is warm and dry.     Capillary Refill: Capillary refill takes less than 2 seconds.     Findings: No rash.  Neurological:     General: No focal deficit present.     Mental Status: She is alert and oriented to person, place, and time. Mental status is at baseline.     Cranial Nerves: No dysarthria or facial asymmetry.  Psychiatric:        Mood and Affect: Mood normal.        Speech: Speech normal.        Behavior: Behavior normal.        Thought Content: Thought content normal.        Judgment: Judgment normal.      UC Treatments / Results  Labs (all labs ordered are listed, but only abnormal results are displayed) Labs Reviewed - No data to display  EKG   Radiology No results found.  Procedures Procedures (including critical care time)  Medications Ordered in UC Medications - No data to  display  Initial Impression / Assessment and Plan / UC Course  I have reviewed the triage vital signs and the nursing notes.  Pertinent labs & imaging results that were available during my care of the patient were reviewed by me and considered in my medical decision making (see chart for details).   1.  Dental infection Patient is in her usual state of health other than infected dental caries.  Dental infection will be treated with Augmentin twice daily for 7 days. Tylenol  as needed for pain. No red flag signs on exam. No signs of dental abscess or deep soft tissue space infection. Encouraged follow-up with PCP who may then refer patient to a dental specialist for further evaluation and treatment/min of dental caries.   Counseled patient on potential for adverse effects with medications prescribed/recommended today, strict ER and return-to-clinic precautions discussed, patient verbalized understanding.    Final Clinical Impressions(s) / UC Diagnoses   Final diagnoses:  Dental infection     Discharge Instructions      Give Jillaine Augmentin antibiotic every 12 hours for 7 days. This will treat dental infection. Give tylenol  every 6 hours as needed for pain. Schedule an appointment  for 7 days from now with PCP who will then refer to dental surgeon.  If she develops a fever, chills, nausea, vomiting, or changes in mental status, take her to the hospital.   If you develop any new or worsening symptoms or if your symptoms do not start to improve, please return here or follow-up with your primary care provider. If your symptoms are severe, please go to the emergency room.  ED Prescriptions     Medication Sig Dispense Auth. Provider  amoxicillin -clavulanate (AUGMENTIN) 400-57 MG/5ML suspension Take 9.6 mLs (768 mg total) by mouth 2 (two) times daily for 7 days. 134.4 mL Starlene Eaton, FNP      PDMP not reviewed this encounter.   Starlene Eaton, [place] 12/01/23  [phone]

## 2023-12-01 NOTE — Discharge Instructions (Addendum)
 Give Kristin Fitzgerald Augmentin antibiotic every 12 hours for 7 days. This will treat dental infection. Give tylenol  every 6 hours as needed for pain. Schedule an appointment  for 7 days from now with PCP who will then refer to dental surgeon.  If she develops a fever, chills, nausea, vomiting, or changes in mental status, take her to the hospital.   If you develop any new or worsening symptoms or if your symptoms do not start to improve, please return here or follow-up with your primary care provider. If your symptoms are severe, please go to the emergency room.

## 2023-12-04 ENCOUNTER — Encounter: Payer: Self-pay | Admitting: *Deleted

## 2024-01-04 ENCOUNTER — Telehealth (INDEPENDENT_AMBULATORY_CARE_PROVIDER_SITE_OTHER): Payer: Self-pay | Admitting: Family

## 2024-01-04 NOTE — Telephone Encounter (Signed)
 Patient's mother called asking [name] Bollman to complete a 3051 form for PCS. I informed mom that we could complete the form, but we had to schedule a visit because Kristin Fitzgerald had not been seen in the past 90 days. I was able to get her scheduled while on the phone.

## 2024-01-09 ENCOUNTER — Ambulatory Visit (INDEPENDENT_AMBULATORY_CARE_PROVIDER_SITE_OTHER): Payer: MEDICAID | Admitting: Family

## 2024-02-06 ENCOUNTER — Telehealth (INDEPENDENT_AMBULATORY_CARE_PROVIDER_SITE_OTHER): Payer: Self-pay | Admitting: Family

## 2024-02-06 ENCOUNTER — Other Ambulatory Visit (INDEPENDENT_AMBULATORY_CARE_PROVIDER_SITE_OTHER): Payer: Self-pay | Admitting: Family

## 2024-02-06 DIAGNOSIS — F4329 Adjustment disorder with other symptoms: Secondary | ICD-10-CM

## 2024-02-06 MED ORDER — FLUOXETINE HCL 20 MG/5ML PO SOLN: ORAL | 3 refills | Status: DC

## 2024-02-06 NOTE — Telephone Encounter (Signed)
  Name of who is calling:  Caller's Relationship to Patient:  Best contact number: [phone]  Provider they see: [name] Bollman  Reason for call: Mom called and stated the patient cannot take pills     PRESCRIPTION REFILL ONLY  Name of prescription:  Pharmacy:

## 2024-02-06 NOTE — Telephone Encounter (Signed)
 Who's calling (name and relationship to patient) : Kristin Fitzgerald; mom  Best contact number: [phone]  Provider they see: Marianna, NP  Reason for call: Kristin called in requesting for [name] to call her back regarding meds that was previously discussed. Kristin stated that she does want to start the meds,but there has been some things happening and would like to speak with [name] about it.    Call ID:      PRESCRIPTION REFILL ONLY  Name of prescription:  Pharmacy:

## 2024-02-06 NOTE — Telephone Encounter (Signed)
 I attempted to call Mom to discuss options for treatment but there was no answer and no option for voicemail. I will try to reach her another time.

## 2024-02-06 NOTE — Telephone Encounter (Signed)
 I called and talked with Kristin Fitzgerald about Kristin Fitzgerald's behavior. I recommended trial of Fluoxetine  for her mood. I told Kristin Fitzgerald that it was important for her to bring Kristin Fitzgerald for visit on 02/19/24 as scheduled for follow up. Kristin Fitzgerald agreed with this plan.

## 2024-02-06 NOTE — Telephone Encounter (Signed)
 Contacted patients mother.  Verified patients name and DOB as well as mothers name.   Mom stated that she is been experiencing really bad behaviors from Kristin Fitzgerald such as her cursing and meltdowns, verbal abuse and convulsive lying. Mom says that she would have to agree with things that aren't true such as the death of others in order to keep Kristin Fitzgerald calm.    Mom would like to start the medication that was discussed with Mrs. Kristin Fitzgerald.   SS, CCMA

## 2024-02-14 ENCOUNTER — Encounter (HOSPITAL_COMMUNITY): Payer: Self-pay

## 2024-02-14 ENCOUNTER — Emergency Department (HOSPITAL_COMMUNITY)
Admission: EM | Admit: 2024-02-14 | Discharge: 2024-02-14 | Disposition: A | Discharge status: Home / Self Care | Payer: MEDICAID | Attending: Emergency Medicine | Admitting: Emergency Medicine

## 2024-02-14 DIAGNOSIS — M62830 Muscle spasm of back: Secondary | ICD-10-CM | POA: Insufficient documentation

## 2024-02-14 DIAGNOSIS — M545 Low back pain, unspecified: Secondary | ICD-10-CM | POA: Diagnosis present

## 2024-02-14 MED ORDER — METHOCARBAMOL 500 MG PO TABS
500.0000 mg | ORAL_TABLET | Freq: Once | ORAL | Status: AC
  Administered 2024-02-14: 500 mg via ORAL
  Filled 2024-02-14: qty 1

## 2024-02-14 MED ORDER — METHOCARBAMOL 500 MG PO TABS: 500.0000 mg | ORAL_TABLET | Freq: Two times a day (BID) | ORAL | 0 refills | Status: DC

## 2024-02-14 MED ORDER — LIDOCAINE 5 % EX PTCH: 1.0000 | MEDICATED_PATCH | CUTANEOUS | 0 refills | Status: DC

## 2024-02-14 MED ORDER — KETOROLAC TROMETHAMINE 15 MG/ML IJ SOLN
15.0000 mg | Freq: Once | INTRAMUSCULAR | Status: AC
  Administered 2024-02-14: 15 mg via INTRAMUSCULAR
  Filled 2024-02-14: qty 1

## 2024-02-14 MED ORDER — LIDOCAINE 5 % EX PTCH
1.0000 | MEDICATED_PATCH | CUTANEOUS | Status: DC
Start: 2024-02-14 — End: 2024-02-14
  Administered 2024-02-14: 1 via TRANSDERMAL
  Filled 2024-02-14: qty 1

## 2024-02-14 NOTE — ED Provider Notes (Signed)
 [place] EMERGENCY DEPARTMENT AT [facility] Provider Note   CSN: [id number] Arrival date & time: 02/14/24  0015     Patient presents with: Back Pain   Kristin Fitzgerald is a 33 y.o. female past medical history significant for cerebral palsy and seizures presents today for right lower back pain after rolling over in bed.  Patient's family denies any trauma, fever, chills, nausea, vomiting, any other complaints at this time.    Back Pain      Prior to Admission medications   Medication Sig Start Date End Date Taking? Authorizing Provider  lidocaine  (LIDODERM ) 5 % Place 1 patch onto the skin daily. Remove & Discard patch within 12 hours or as directed by MD 02/14/24  Yes Yuan Gann N, PA-C  methocarbamol  (ROBAXIN ) 500 MG tablet Take 1 tablet (500 mg total) by mouth 2 (two) times daily. 02/14/24  Yes Darica Goren N, PA-C  carbamazepine  (CARBATROL ) 200 MG 12 hr capsule Take 1 capsule (200 mg total) by mouth 2 (two) times daily. 09/20/23   Marianna City, NP  cetirizine  HCl (ZYRTEC ) 5 MG/5ML SYRP Take 10 mLs (10 mg total) by mouth daily. 11/01/15   Myrla Jon HERO, MD  FLUoxetine  (PROZAC ) 20 MG/5ML solution Give 2.5ml once per day for 1 week, then give 5ml once per day 02/06/24   Marianna City, NP  gabapentin  (NEURONTIN ) 100 MG capsule TAKE 1 CAPSULE IN THE MORNING AND 2 CAPSULES AT BEDTIME 09/20/23   Marianna City, NP  Nutritional Supplements (NUTRITIONAL SUPPLEMENT PLUS) LIQD 2 Boost Plus (chocolate only) or high-calorie (1.5 kcal/oz, chocolate only) alternative given PO daily. 03/16/22   Marianna City, NP    Allergies: Demerol    Review of Systems  Musculoskeletal:  Positive for back pain.    Updated Vital Signs BP 131/77   Pulse 78   Temp 98.9 F (37.2 C) (Oral)   Resp 16   SpO2 100%   Physical Exam Vitals and nursing note reviewed.  Constitutional:      General: She is not in acute distress.    Appearance: She is well-developed.   HENT:     Head: Normocephalic and atraumatic.     Right Ear: External ear normal.     Left Ear: External ear normal.     Nose: Nose normal.  Eyes:     Conjunctiva/sclera: Conjunctivae normal.  Cardiovascular:     Rate and Rhythm: Normal rate and regular rhythm.  Pulmonary:     Effort: Pulmonary effort is normal. No respiratory distress.  Abdominal:     Palpations: Abdomen is soft.     Tenderness: There is no abdominal tenderness.  Musculoskeletal:        General: Tenderness present. No swelling.     Cervical back: Neck supple.     Comments: Tenderness to palpation of the lumbar right paraspinal muscles, muscle spasm noted on exam.  Patient denies any bony tenderness of the spine.  Patient neurovascularly intact  Skin:    General: Skin is warm and dry.     Capillary Refill: Capillary refill takes less than 2 seconds.  Neurological:     Mental Status: She is alert. Mental status is at baseline.  Psychiatric:        Mood and Affect: Mood normal.     (all labs ordered are listed, but only abnormal results are displayed) Labs Reviewed - No data to display  EKG: None  Radiology: No results found.   Procedures   Medications Ordered in the  ED  ketorolac  (TORADOL ) 15 MG/ML injection 15 mg (has no administration in time range)  methocarbamol  (ROBAXIN ) tablet 500 mg (has no administration in time range)  lidocaine  (LIDODERM ) 5 % 1 patch (has no administration in time range)                                    Medical Decision Making  This patient presents to the ED for concern of back pain differential diagnosis includes cauda equina syndrome, musculoskeletal pain, vertebral fracture  Medicines ordered and prescription drug management:  I ordered medication including Robaxin , Toradol , Lidoderm  patch    I have reviewed the patients home medicines and have made adjustments as needed   Problem List / ED Course:  Considered for admission or further workup however  patient's vital signs and physical exam are reassuring.  No red flag signs or symptoms concerning for cauda equina syndrome, or vertebral injury.  Patient given short course of muscle relaxers and Lidoderm  patches and advised to take Tylenol  and Motrin .  I feel patient safe for discharge at this time.       Final diagnoses:  Muscle spasm of back    ED Discharge Orders          Ordered    lidocaine  (LIDODERM ) 5 %  Every 24 hours        02/14/24 0206    methocarbamol  (ROBAXIN ) 500 MG tablet  2 times daily        02/14/24 0206               Francis Ileana SAILOR, PA-C 02/14/24 0215    Haze Lonni PARAS, MD 02/14/24 [phone]

## 2024-02-14 NOTE — Discharge Instructions (Addendum)
 Today you were seen for back pain, I suspect this is likely due to a back spasm.  Please pick up your medications and take as prescribed.  You may also alternate Tylenol  and Motrin  as needed for pain.  Please follow-up with your primary care if your symptoms persist for further evaluation workup.  Thank you for letting us  treat you today. After performing a physical exam, I feel you are safe to go home. Please follow up with your PCP in the next several days and provide them with your records from this visit. Return to the Emergency Room if pain becomes severe or symptoms worsen.

## 2024-02-14 NOTE — ED Triage Notes (Signed)
 Brought in via EMS, back pain for past 30 mins Felt pain after rolling in bed

## 2024-02-19 ENCOUNTER — Ambulatory Visit (INDEPENDENT_AMBULATORY_CARE_PROVIDER_SITE_OTHER): Payer: MEDICAID | Admitting: Family

## 2024-02-20 NOTE — Progress Notes (Unsigned)
 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 She can have oppositional behavior but that has improved over time.    Seizures have been well controlled for years on the combination of Carbatrol  and gabapentin  which he tolerates without side effects.  She is totally dependent on others for dressing and bathing changing her diaper, is unable to feed herself or prepare meals. She has had problems with weight loss and is being followed by the dietician.   Due to Kristin Fitzgerald's medical condition, she is indefinitely incontinent of stool and urine.  It is medically necessary for her to use diapers, underpads, and gloves to assist with hygiene and skin integrity.     Today's concerns: Kristin Fitzgerald is seen today to follow up for concern about her behavior. Mom called earlier this month to report that her mood was angry and that Kristin Fitzgerald was lashing out at her family. In addition she was fabricating stories and lying about events. I recommended a trial of Fluoxetine . Mom is unsure if it has helped yet but believes that Kristin Fitzgerald has tolerated it without side effects.  She was seen in the ED last week for muscle spasms in her back. She was prescribed muscle relaxants and anti-inflammatory medications, and has experienced relief.  Mom believes that some of Kristin Fitzgerald's behavior comes from being bored. She has  applied for CAP/DA and is hopeful that she will receive funds from that program to allow Kristin Fitzgerald to attend a day program Kristin Fitzgerald continues to have problems maintaining her weight. Mom says that she eats very slowly and gets full quickly. She denies any problems with coughing, choking or other problems with swallowing. She is receiving 3 Boost supplements per day Mom also notes that Kristin Fitzgerald has problems with constipation and believes that affects her appetite.  Mom requested that I complete forms for Personal Care Services and for [facility] [employer].  Kristin Fitzgerald has been otherwise generally healthy since she was last seen. No health concerns today other than previously mentioned.  Review of systems: Please see HPI for neurologic and other pertinent review of systems. Otherwise all other systems were reviewed and were negative.  Problem List: Patient Active Problem List   Diagnosis Date Noted   Need for assistance with personal care 03/06/2023   Increased nutritional needs [R63.8] 06/25/2022   Underweight [R63.6] 06/25/2022   Inadequate oral intake [R63.8] 06/25/2022   Full incontinence of feces 06/25/2022   Caregiver stress 12/12/2021   Insomnia 12/12/2021   Impaired nutrition 10/20/2021   Cough 08/03/2021   Loss of weight 08/11/2020   COVID-19 vaccine dose not administered 08/04/2020   Adjustment disorder with emotional disturbance 08/05/2019   Need for immunization against influenza 05/07/2017   Nutrition impaired due to limited access to healthful foods 05/07/2017   Thrush 07/09/2016   Constipation 07/09/2016   Dysphagia 10/25/2015   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 1 oz. Infant born at [redacted] weeks gestational age   Gestation was complicated by premature delivery for unknown reasons. Nursery Course was complicated by seizures a day 3 of life.  The patient had no hemorrhage but had periventricular leukomalacia. 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 She has 76 CAPS hours per week. Her mother works in childcare.  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   She lives with her mother and her brother Jerona.   Social Drivers of Corporate investment banker Strain: Not on file  Food Insecurity: No Food Insecurity (12/21/2021)   Hunger Vital Sign    Worried About Running Out of Food in the Last Year: Never true    Ran Out of Food in the Last Year: Never true  Transportation Needs: No Transportation Needs (12/21/2021)   PRAPARE - Administrator, Civil Service (Medical): No    Lack of Transportation (Non-Medical): No  Physical Activity: Not on file  Stress: Not on file  Social Connections: Not on file  Intimate Partner Violence: Not on file    Past/failed meds:  Allergies: Allergies  Allergen Reactions   Demerol Nausea And Vomiting    Immunizations: Immunization History  Administered Date(s) Administered   Hepatitis B 07/06/2011, 11/07/2011   Influenza Split 07/06/2011, 05/31/2012   Influenza, Seasonal, Injecte, Preservative Fre 03/06/2023   Influenza,inj,Quad PF,6+ Mos 04/16/2013,  04/21/2014, 06/07/2015, 07/07/2016, 05/07/2017, 04/30/2018, 03/05/2019, 05/12/2020    Diagnostics/Screenings: Copied from previous record: CT brain June 16, 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   Physical Exam: BP 124/82   Pulse 82   Wt 68 lb (30.8 kg)   LMP 01/08/2024   BMI 17.02 kg/m  Wt Readings from Last 3 Encounters:  02/21/24 68 lb (30.8 kg)  09/20/23 75 lb (34 kg)  06/22/22 71 lb (32.2 kg)     General: small for age, thin but well developed, well nourished woman, seated in wheelchair, in no evident distress Head: normocephalic and atraumatic. Oropharynx difficult to examine but appears benign. No  dysmorphic features. Neck: supple Cardiovascular: regular rate and rhythm, no murmurs. Respiratory: clear to auscultation bilaterally Abdomen: bowel sounds present all four quadrants, abdomen soft, non-tender, non-distended. No hepatosplenomegaly or masses palpated. Musculoskeletal: no skeletal deformities or obvious scoliosis. Has severe convex left neuromuscular scoliosis. Has contractures at the right elbow, both knees and tight heel cords. Has right hemiatrophy upper greater than lower extremities Skin: no rashes or neurocutaneous lesions  Neurologic Exam Mental Status: awake and fully alert. Has limited language.  Smiles responsively. Unable to follow instructions or participate in examination Cranial Nerves: fundoscopic exam - red reflex present.  Unable to fully visualize fundus.  Pupils equal briskly reactive to light.  Turns to localize faces and objects in the periphery. Turns to localize sounds in the periphery. Facial movements are symmetric. Motor: spastic quadriparesis  Sensory: withdrawal x 4 Coordination: unable to adequately assess due to patient's inability to participate in examination. Has dysmetria with reach for objects. Gait and Station: unable to stand and bear weight.   Impression: Generalized convulsive epilepsy (HCC)  Constipation, unspecified constipation type - Plan: polyethylene glycol powder (GLYCOLAX /MIRALAX ) 17 GM/SCOOP powder, senna (SENOKOT) 8.6 MG TABS tablet  Nutrition impaired due to limited access to healthful foods  Loss of weight  Impaired nutrition  Increased nutritional needs [R63.8]  Inadequate oral intake [R63.8]  Congenital quadriplegia (HCC)  Adjustment disorder with emotional disturbance  Partial epilepsy with impairment of consciousness (HCC)  Mild intellectual disability  Neuromuscular scoliosis of thoracolumbar region   Recommendations for plan of care: The patient's previous Epic records were reviewed. No recent diagnostic  studies to be reviewed with the patient.  Plan until next visit: Continue feedings and medications as prescribed  Prescriptions sent in for Senna and Miralax  for constipation I will complete the forms and send them in Call for questions or concerns Follow up in December or sooner if needed  The medication list was reviewed and reconciled. No changes were made in the prescribed medications today. A complete medication list was provided to the patient.  Allergies as of 02/21/2024       Reactions   Demerol Nausea And Vomiting        Medication List        Accurate as of February 21, 2024  7:10 PM. If you have any questions, ask your nurse or doctor.          carbamazepine  200 MG 12 hr capsule Commonly known as: CARBATROL  Take 1 capsule (200 mg total) by mouth 2 (two) times daily.   cetirizine  HCl 5 MG/5ML Syrp Commonly known as: Zyrtec  Take 10 mLs (10 mg total) by mouth daily.   FLUoxetine  20 MG/5ML solution Commonly known as: PROZAC  Give 2.5ml once per day for 1 week, then give 5ml once per day   gabapentin   100 MG capsule Commonly known as: NEURONTIN  TAKE 1 CAPSULE IN THE MORNING AND 2 CAPSULES AT BEDTIME   lidocaine  5 % Commonly known as: Lidoderm  Place 1 patch onto the skin daily. Remove & Discard patch within 12 hours or as directed by MD   methocarbamol  500 MG tablet Commonly known as: ROBAXIN  Take 1 tablet (500 mg total) by mouth 2 (two) times daily.   Nutritional Supplement Plus Liqd 3 Boost Plus (chocolate only) or high-calorie (1.5 kcal/oz, chocolate only) alternative given PO daily. What changed: additional instructions Changed by: Kristin Fitzgerald   polyethylene glycol powder 17 GM/SCOOP powder Commonly known as: GLYCOLAX /MIRALAX  Give 17gm (1 capful) daily Started by: Kristin Fitzgerald   senna 8.6 MG Tabs tablet Commonly known as: SENOKOT Take 1 tablet (8.6 mg total) by mouth daily. Started by: Kristin Fitzgerald      Total time spent with the  patient was 30 minutes, of which 50% or more was spent in counseling and coordination of care.  Kristin Bollman NP-C [facility] [address], [address] [place], [place] [address] Ph. [phone] Fax [phone]

## 2024-02-21 ENCOUNTER — Encounter (INDEPENDENT_AMBULATORY_CARE_PROVIDER_SITE_OTHER): Payer: Self-pay | Admitting: Family

## 2024-02-21 ENCOUNTER — Ambulatory Visit (INDEPENDENT_AMBULATORY_CARE_PROVIDER_SITE_OTHER): Payer: MEDICAID | Admitting: Family

## 2024-02-21 VITALS — BP 124/82 | HR 82 | Wt <= 1120 oz

## 2024-02-21 DIAGNOSIS — K5900 Constipation, unspecified: Secondary | ICD-10-CM

## 2024-02-21 DIAGNOSIS — F70 Mild intellectual disabilities: Secondary | ICD-10-CM

## 2024-02-21 DIAGNOSIS — E639 Nutritional deficiency, unspecified: Secondary | ICD-10-CM | POA: Diagnosis not present

## 2024-02-21 DIAGNOSIS — R638 Other symptoms and signs concerning food and fluid intake: Secondary | ICD-10-CM

## 2024-02-21 DIAGNOSIS — R636 Underweight: Secondary | ICD-10-CM

## 2024-02-21 DIAGNOSIS — F4329 Adjustment disorder with other symptoms: Secondary | ICD-10-CM

## 2024-02-21 DIAGNOSIS — G40209 Localization-related (focal) (partial) symptomatic epilepsy and epileptic syndromes with complex partial seizures, not intractable, without status epilepticus: Secondary | ICD-10-CM

## 2024-02-21 DIAGNOSIS — R634 Abnormal weight loss: Secondary | ICD-10-CM | POA: Diagnosis not present

## 2024-02-21 DIAGNOSIS — G40309 Generalized idiopathic epilepsy and epileptic syndromes, not intractable, without status epilepticus: Secondary | ICD-10-CM | POA: Diagnosis not present

## 2024-02-21 DIAGNOSIS — G808 Other cerebral palsy: Secondary | ICD-10-CM

## 2024-02-21 DIAGNOSIS — M4145 Neuromuscular scoliosis, thoracolumbar region: Secondary | ICD-10-CM

## 2024-02-21 DIAGNOSIS — R1310 Dysphagia, unspecified: Secondary | ICD-10-CM

## 2024-02-21 MED ORDER — SENNA 8.6 MG PO TABS: 1.0000 | ORAL_TABLET | Freq: Every day | ORAL | 5 refills | Status: DC

## 2024-02-21 MED ORDER — NUTRITIONAL SUPPLEMENT PLUS PO LIQD
ORAL | 12 refills | Status: AC
Start: 2024-02-21 — End: ?

## 2024-02-21 MED ORDER — POLYETHYLENE GLYCOL 3350 17 GM/SCOOP PO POWD: ORAL | 5 refills | Status: DC

## 2024-02-21 MED ORDER — CARBAMAZEPINE ER 200 MG PO CP12: 200.0000 mg | ORAL_CAPSULE | Freq: Two times a day (BID) | ORAL | 5 refills | Status: DC

## 2024-02-21 MED ORDER — GABAPENTIN 100 MG PO CAPS: ORAL_CAPSULE | ORAL | 5 refills | Status: DC

## 2024-02-21 NOTE — Patient Instructions (Signed)
 It was a pleasure to see you today!  Instructions for you until your next appointment are as follows: Increase Fluoxetine  dose to 5ml per day starting tomorrow I will send in prescriptions for Senna and Miralax  to help with constipation. Give 1 tablet of Senna per day and 1 capful of Miralax  per day until she has a bowel movement. Then give every other day as needed to produce a bowel movement. I will complete the transportation form and the personal care services forms.  Please sign up for MyChart if you have not done so. Please plan to return for follow up in December or sooner if needed.  Feel free to contact our office during normal business hours at [phone] with questions or concerns. If there is no answer or the call is outside business hours, please leave a message and our clinic staff will call you back within the next business day.  If you have an urgent concern, please stay on the line for our after-hours answering service and ask for the on-call neurologist.     I also encourage you to use MyChart to communicate with me more directly. If you have not yet signed up for MyChart within [facility], the front desk staff can help you. However, please note that this inbox is NOT monitored on nights or weekends, and response can take up to 2 business days.  Urgent matters should be discussed with the on-call pediatric neurologist.   At [facility], we are committed to providing exceptional care. You will receive a patient satisfaction survey through text or email regarding your visit today. Your opinion is important to me. Comments are appreciated.

## 2024-03-02 ENCOUNTER — Other Ambulatory Visit (INDEPENDENT_AMBULATORY_CARE_PROVIDER_SITE_OTHER): Payer: Self-pay | Admitting: Family

## 2024-03-02 DIAGNOSIS — F4329 Adjustment disorder with other symptoms: Secondary | ICD-10-CM

## 2024-03-10 ENCOUNTER — Telehealth (INDEPENDENT_AMBULATORY_CARE_PROVIDER_SITE_OTHER): Payer: Self-pay | Admitting: Family

## 2024-03-10 DIAGNOSIS — F4329 Adjustment disorder with other symptoms: Secondary | ICD-10-CM

## 2024-03-10 NOTE — Telephone Encounter (Signed)
  Name of who is calling: lisa   Caller's Relationship to Patient: mother   Best contact number: [phone]  Provider they see: tina   Reason for call: mother has some questions regarding the pts medication she would like a call back asap     PRESCRIPTION REFILL ONLY  Name of prescription:  Pharmacy:
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
## 2024-03-11 MED ORDER — FLUOXETINE HCL 20 MG/5ML PO SOLN: ORAL | 2 refills | Status: DC

## 2024-03-11 NOTE — Telephone Encounter (Signed)
 I called and spoke with Mom. She reported that Kristin Fitzgerald's behavior was unchanged despite taking Fluoxetine . I recommended increasing the dose further and checking back in a few weeks. Mom agreed with this plan

## 2024-04-10 ENCOUNTER — Ambulatory Visit (INDEPENDENT_AMBULATORY_CARE_PROVIDER_SITE_OTHER): Payer: MEDICAID

## 2024-04-10 VITALS — BP 147/58 | HR 81

## 2024-04-10 DIAGNOSIS — Z0000 Encounter for general adult medical examination without abnormal findings: Secondary | ICD-10-CM | POA: Diagnosis not present

## 2024-04-10 DIAGNOSIS — R634 Abnormal weight loss: Secondary | ICD-10-CM | POA: Diagnosis not present

## 2024-04-10 DIAGNOSIS — G40909 Epilepsy, unspecified, not intractable, without status epilepticus: Secondary | ICD-10-CM

## 2024-04-10 DIAGNOSIS — Z23 Encounter for immunization: Secondary | ICD-10-CM

## 2024-04-10 DIAGNOSIS — D509 Iron deficiency anemia, unspecified: Secondary | ICD-10-CM

## 2024-04-10 DIAGNOSIS — T17320D Food in larynx causing asphyxiation, subsequent encounter: Secondary | ICD-10-CM
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
## 2024-04-10 NOTE — Patient Instructions (Signed)
 High-Protein and High-Calorie Diet Eating high-protein and high-calorie foods can help you to gain weight, heal after an injury, and get better after an illness or surgery. The amount of daily protein and calories you need depends on: Your body weight. The reason you were told to follow this diet. Usually, a high-protein, high-calorie diet means that you should: Eat 250-500 extra calories each day. Make sure that you get enough of your daily calories from protein. Ask your health care provider how many of your calories should come from protein and how many calories total you need each day. Follow the diet as told by your provider. What are tips for following this plan? Reading food labels Check the nutrition facts label for calories, and grams of fat and protein. Items with more than 4 grams of protein are high-protein foods. General information  Ask your provider if you should take a nutritional supplement. Try to eat six small meals each day instead of three large meals. A goal is usually to eat every 2 to 3 hours. Eat a balanced diet. In each meal, include one food that's high in protein and one food with fat in it. Keep nutritious snacks available, such as nuts, trail mixes, dried fruit, and whole-milk yogurt. If you have kidney disease or diabetes, talk with your provider about how much protein is safe for you. Too much protein may put extra stress on your kidneys. Replace zero-calorie drinks with drinks that have calories in them, such as milk and 100% fruit juice. Consider setting a timer to remind you to eat. You'll want to eat even if you do not feel very hungry. Preparing meals Milk and dairy foods. Add whole milk, half-and-half, or heavy cream to cereal, pudding, soup, or hot cocoa. Add whole milk to instant breakfast drinks. Add powdered milk to baked goods, smoothies, or milkshakes. Add powdered milk, cream, or butter to mashed potatoes. Replace water with milk or heavy cream  when making foods such as oatmeal, pudding, or cocoa. Make cream-based pastas and soups. Add cheese to cooked vegetables. Make whole-milk yogurt parfaits. Top them with granola, fruit, or nuts. Add cottage cheese to fruit. Add cream cheese to sandwiches or as a topping on crackers and bread. Eggs. Add hard-boiled eggs to salads. Keep hard-boiled eggs in the fridge to snack on. Add cheese to cooked eggs. Beans, nuts, and seeds. Add peanut butter to oatmeal or smoothies. Use peanut butter as a dip for fruits and vegetables or as a topping for pretzels, celery, or crackers. Add beans to casseroles, dips, and spreads. Add pureed beans to sauces and soups. Salads, soups, and other foods. Add avocado, cheese, or both to sandwiches or salads. Add avocado to smoothies. Add meat, poultry, or seafood to rice, pasta, casseroles, salads, and soups. Use mayonnaise when making egg salad, chicken salad, or tuna salad. Add oil or butter to cooked vegetables and grains. What high-protein foods should I eat?  Vegetables Soybeans. Peas. Grains Quinoa. Bulgur wheat. Buckwheat. Meats and other proteins Beef, pork, and poultry. Fish and seafood. Eggs. Tofu. Textured vegetable protein (TVP). Peanut butter. Nuts and seeds. Dried beans. Protein powders. Hummus. Jerky. Dairy Whole milk. Whole-milk yogurt. Powdered milk. Cheese. Cottage cheese. Eggnog. Beverages High-protein supplement drinks. Soy milk. Other foods Protein bars. The items listed above may not be all the foods and drinks you can have. Talk with an expert in healthy eating called a dietitian to learn more. What high-calorie foods should I eat? Fruits Dried fruit. Fruit leather. Canned  fruit in syrup. Fruit juice. Avocado. Vegetables Vegetables cooked in oil or butter. Fried potatoes. Grains Pasta. Quick breads. Muffins. Pancakes. Granola. Meats and other proteins Peanut butter and other nut butters. Nuts and seeds. Dairy Heavy cream.  Whipped cream. Cream cheese. Sour cream. Ice cream. Custard. Pudding. Whole-milk dairy products. Beverages Meal-replacement beverages. Nutrition shakes. Fruit juice. Seasonings and condiments Salad dressing. Mayonnaise. Alfredo sauce. Fruit preserves or jelly. Honey. Syrup. Sweets and desserts Cake. Cookies. Pie. Pastries. Candy bars. Chocolate. Fats and oils Butter or margarine. Oil. Gravy. Other foods Meal-replacement bars. The items listed above may not be all the foods and drinks you can have. Talk with an expert in healthy eating to learn more. This information is not intended to replace advice given to you by your health care provider. Make sure you discuss any questions you have with your health care provider. Document Revised: 11/06/2022 Document Reviewed: 11/06/2022 Elsevier Patient Education  2024 [employer].

## 2024-04-11 ENCOUNTER — Ambulatory Visit: Payer: Self-pay

## 2024-04-11 ENCOUNTER — Other Ambulatory Visit: Payer: Self-pay

## 2024-04-11 LAB — COMPREHENSIVE METABOLIC PANEL WITH GFR
ALT: 9 IU/L (ref 0–32)
AST: 18 IU/L (ref 0–40)
Albumin: 4.6 g/dL (ref 3.9–4.9)
Alkaline Phosphatase: 76 IU/L (ref 41–116)
BUN/Creatinine Ratio: 10 (ref 9–23)
BUN: 6 mg/dL (ref 6–20)
Bilirubin Total: 0.2 mg/dL (ref 0.0–1.2)
CO2: 24 mmol/L (ref 20–29)
Calcium: 9.4 mg/dL (ref 8.7–10.2)
Chloride: 99 mmol/L (ref 96–106)
Creatinine, Ser: 0.6 mg/dL (ref 0.57–1.00)
Globulin, Total: 3.2 g/dL (ref 1.5–4.5)
Glucose: 83 mg/dL (ref 70–99)
Potassium: 3.8 mmol/L (ref 3.5–5.2)
Sodium: 137 mmol/L (ref 134–144)
Total Protein: 7.8 g/dL (ref 6.0–8.5)
eGFR: 120 mL/min/1.73 (ref >59)

## 2024-04-11 LAB — CBC WITH DIFFERENTIAL/PLATELET
Basophils Absolute: 0 x10E3/uL (ref 0.0–0.2)
Basos: 1 %
EOS (ABSOLUTE): 0 x10E3/uL (ref 0.0–0.4)
Eos: 0 %
Hematocrit: 33.9 % — ABNORMAL LOW (ref 34.0–46.6)
Hemoglobin: 9.7 g/dL — ABNORMAL LOW (ref 11.1–15.9)
Immature Grans (Abs): 0 x10E3/uL (ref 0.0–0.1)
Immature Granulocytes: 0 %
Lymphocytes Absolute: 0.9 x10E3/uL (ref 0.7–3.1)
Lymphs: 36 %
MCH: 21.5 pg — ABNORMAL LOW (ref 26.6–33.0)
MCHC: 28.6 g/dL — ABNORMAL LOW (ref 31.5–35.7)
MCV: 75 fL — ABNORMAL LOW (ref 79–97)
Monocytes Absolute: 0.4 x10E3/uL (ref 0.1–0.9)
Monocytes: 14 %
Neutrophils Absolute: 1.3 x10E3/uL — ABNORMAL LOW (ref 1.4–7.0)
Neutrophils: 49 %
Platelets: 208 x10E3/uL (ref 150–450)
RBC: 4.52 x10E6/uL (ref 3.77–5.28)
RDW: 18.5 % — ABNORMAL HIGH (ref 11.7–15.4)
WBC: 2.5 x10E3/uL — CL (ref 3.4–10.8)

## 2024-04-11 MED ORDER — FERROUS SULFATE 300 (60 FE) MG/5ML PO SOLN: 300.0000 mg | Freq: Every day | ORAL | 3 refills | Status: DC

## 2024-04-11 NOTE — Addendum Note (Signed)
 Addended by: LUPIE CREDIT on: 04/11/2024 06:52 PM   Modules accepted: Orders

## 2024-04-11 NOTE — Progress Notes (Signed)
 Spoke with primary caretaker patient mother over the phone to relay all lab results.  She is aware of neutropenia and that I have contacted [name] Bollman to ask about seizure medication changes.  I will contact the mother once I hear back.  She is okay with further workup of anemia with ferritin, B12, and HIV testing and will come back in the afternoon next week of Tuesday for a lab visit.  Mother verbalizes understanding and is agreeable with plan. Will send in liquid oral iron to pharmacy. Pending future labs.

## 2024-04-11 NOTE — Assessment & Plan Note (Signed)
 Epilepsy on carbamazepine  200mg  BID for years. No new seizures or changes.  - CBC and CMP today to evaluate for hyponatremia and neutropenia commonly caused by this medication

## 2024-04-15 ENCOUNTER — Other Ambulatory Visit: Payer: MEDICAID

## 2024-04-16 ENCOUNTER — Telehealth (INDEPENDENT_AMBULATORY_CARE_PROVIDER_SITE_OTHER): Payer: Self-pay | Admitting: Family

## 2024-04-16 NOTE — Telephone Encounter (Signed)
 New message   Mom calling   Pt c/o medication issue:  1. Name of Medication: FLUoxetine  (PROZAC ) 20 MG/5ML solution   2. How are you currently taking this medication (dosage and times per day)? Sig: Give 7.5ml once per day for 1 week, then give 10ml once per day after that   3. Are you having a reaction (difficulty breathing--STAT)? No   4. What is your medication issue? Started having nose bleeding last night - mom is asking is this a side effect to medication

## 2024-04-16 NOTE — Telephone Encounter (Signed)
 Attempted to contact patients mother.  Mother unable to be reached.  LVM to call back.  SS, CCMA

## 2024-04-16 NOTE — Telephone Encounter (Signed)
 The medication can make it easier for nosebleeds to occur. This typically happens in the fall and winter when inside of the nose is dryer because of less humidity in the air. Unless the nosebleed was excessive or occurs frequently, I would recommend continuing to take the medication. Please let Mom know. Thanks, [name]

## 2024-04-18 ENCOUNTER — Other Ambulatory Visit: Payer: MEDICAID

## 2024-04-22 ENCOUNTER — Other Ambulatory Visit: Payer: MEDICAID

## 2024-04-22 DIAGNOSIS — D509 Iron deficiency anemia, unspecified: Secondary | ICD-10-CM

## 2024-04-23 LAB — HIV ANTIBODY (ROUTINE TESTING W REFLEX): HIV Screen 4th Generation wRfx: NONREACTIVE

## 2024-04-23 LAB — VITAMIN B12: Vitamin B-12: 1151 pg/mL (ref 232–1245)

## 2024-04-23 LAB — FERRITIN: Ferritin: 7 ng/mL — ABNORMAL LOW (ref 15–150)

## 2024-04-24 ENCOUNTER — Ambulatory Visit (INDEPENDENT_AMBULATORY_CARE_PROVIDER_SITE_OTHER): Payer: MEDICAID

## 2024-04-24 VITALS — BP 112/62 | HR 68 | Wt <= 1120 oz

## 2024-04-24 DIAGNOSIS — R634 Abnormal weight loss: Secondary | ICD-10-CM

## 2024-04-24 DIAGNOSIS — D509 Iron deficiency anemia, unspecified: Secondary | ICD-10-CM

## 2024-04-24 DIAGNOSIS — T17320D Food in larynx causing asphyxiation, subsequent encounter: Secondary | ICD-10-CM

## 2024-04-24 DIAGNOSIS — R030 Elevated blood-pressure reading, without diagnosis of hypertension: Secondary | ICD-10-CM | POA: Diagnosis not present

## 2024-04-24 NOTE — Progress Notes (Signed)
    SUBJECTIVE:   CHIEF COMPLAINT / HPI:   Kianna is a 33yo F who presents with her mother to the [facility] for follow up of elevated BP reading in office without dx of HTN. During last visit, patient's blood pressures were elevated with no true etiology and without history of elevated blood pressures. Doing well today, mother states she is eating better than before. She is tolerating 3 Boost drinks most days. She eats more than the recommended serving size for a more calorie dense meal, such as 2 packs of oatmeal or 2 packs of grits for breakfast. Mother is trying to make meals more calorie dense by adding rice and meat.   Nutritionist appointment made for 12/17. Speech referral still pending.   PERTINENT  PMH / PSH:  wheelchair-bound spastic quadriplegia, intellectual disability, mother is primary caretaker   OBJECTIVE:   BP 112/62   Pulse 68   Wt 64 lb 12.8 oz (29.4 kg)   LMP 04/09/2024   SpO2 98%   BMI 16.22 kg/m   General: A&O, NAD HEENT: No sign of trauma, EOM grossly intact Cardiac: RRR, no m/r/g Respiratory: CTAB, normal WOB, no w/c/r GI: Soft, NTTP, non-distended  Extremities: NTTP, no peripheral edema. Neuro: wheelchair bound Psych: Appropriate mood and affect   ASSESSMENT/PLAN:   Assessment & Plan Elevated BP reading w/ no diagnosis of HTN Normalized this visit. Unclear etiology of in office BP elevations during last visit. - Continue to eat well and stay hydrated Iron deficiency anemia, unspecified iron deficiency anemia type Reviewed labs with mother and patient. Ferritin of 7. Was advised to begin oral iron supplementation at last visit however mother states she has to pay for the oral iron suspension out of pocket and cannot afford it yet.  - Discussed iron rich foods and sent home with list  Weight loss, non-intentional Nutritionist appointment scheduled for 12/17. Weight loss of 4lbs inaccurate today. She was weighed with wheelchair and on a different scale.  Mother states she has been eating more and eating well. Mother is very observant and trying to make new recipes and meals the patient genuinely enjoys while reaching nutrition goals. Continue to try calorie dense and iron fortified meals.  - Follow up with me in the new year after this appointment. Choking due to food in larynx, subsequent encounter Mother states she has not received a phone call from speech therapy for this appointment. - Will reach out to Dayshia for pending referral check as Margit is out of office   Camie Dixons, DO [facility]

## 2024-04-24 NOTE — Patient Instructions (Addendum)
 It was wonderful to see you today.  This is the Nutritionist office you have an appointment with on 12/17:  [facility] at [facility] Address: [address],  [place], [place] [address] Phone: [phone]  Thank you for choosing [facility].   You should follow up in our clinic in 2 months in the new year!  Camie Dixons, DO Family Medicine     Your body needs iron to manufacture healthy red blood cells, the cells that deliver oxygen from the lungs to all the tissues of the body. Iron is obtained from dietary sources, and it is recommended that men and post menopausal women consume 10 mg/day. During childbearing years women should ingest 15- 18mg /day, to make up for iron lost through menstrual bleeding. Blood donors should pay special attention to eating iron- rich foods, and frequent blood donors (more than 2-3 units of blood/year) may want to consider taking a multivitamin with iron, or an iron supplement. Iron is present in many different foods, so eating a varied and healthful diet is important. Vitamin C  enhances the absorption of iron, and eating iron rich foods along with a source of vitamin C (citrus fruits and juices, etc) can help replenish your body's iron stores. Also, iron may be absorbed into foods that have been cooked in iron cookware. Drinking coffee or tea with a meal can greatly decrease iron absorption, therefore it is best not to drink them within one hour of mealtime. Iron absorption can also be lowered by antacid use or a diet excessive in fiber. Dairy products such as cheese, cottage cheese, milk and yogurt, although rich in calcium, have negligible iron content. It is important to eat a variety of foods every day.  Meats: Canned Clams (3 oz) 23.8 mg Oysters (3 oz) 13.2 mg Shrimp (3 oz) 2.6 mg Ground Beef (3 oz) 2.2 mg Pork (3 oz) 2.7 mg Veal or Lamb (3 oz) 3.0 mg Beef Liver (3 oz) 5.2  mg Chicken Liver (3 oz) 10.8 mg Fish, Tuna canned (3 oz) 1.3 mg Chicken Breast (3 oz) 1.1 mg Large Egg (1) 1.0 mg Turkey, Dark Meat (3 oz) 2.0 mg Turkey, Light Meat (3 oz) 1.1 mg  Veggie or Soy Burger (1 Patty) 2.9 mg  Greens/Veggies: Collards or Beet ( Cup) 1.2 mg  Swiss Chard ( Cup) 2.0 mg Spinach ( Cup) cooked or (1 Cup) raw 3.0 mg Brussel sprouts ( Cup) 2.0 mg Beets, Canned ( Cup) 1.5 mg Mushrooms ( Cup) 1.4 mg Peas, Frozen ( Cup) 1.2 mg Potato, Baked with skin on (med) 1.9 mg  Sweet Potato, Baked with skin on (med) 1.1 mg Sauerkraut, canned (  Cup) 1.7 mg Tomato Sauce ( Cup) 1.3 mg  Nuts: Almonds or Pistachios ( Cup) 1.3 mg Pine or cashews (1oz) 1.6 mg Walnuts or mixed (1oz) 1.0 mg Beans: ( Cup) (Black, [place], [employer]  or [place]) 1.6-1.8 mg (Kidney, [place], [employer], or [employer]) 2.6-3.9 mg Soybeans ( Cup) 4.4 mg Tofu, Firm ( Cup) 3.4 mg Soy Milk (2 Cups) 2.7 mg  Grains: Lentils ( Cup) 3.5 mg Wheat Germ (2t) 1.1 mg Enriched Egg Noodles ( Cup) 1.2 mg Pita (4 in. round) 1.0 mg Pumpkin Seeds (1 oz) 4.2 mg Cereal ( Cup) 2-12 mg Instant or Prepared Grits ( Cup) 7.1 mg Chex Mix (2/3 Cup) 7.0 mg Cream of Wheat ( Cup) 5.2 mg Oatmeal ( Cup) Instant fortified with iron 5.0 mg Biscuit (4  in) 2.9 mg English or Bran Muffin 2.3 mg Bagel (4 in) 3.5-5.4 mg Pretzels (2 oz) 3.1 mg

## 2024-06-03 ENCOUNTER — Other Ambulatory Visit (INDEPENDENT_AMBULATORY_CARE_PROVIDER_SITE_OTHER): Payer: Self-pay | Admitting: Family

## 2024-06-03 DIAGNOSIS — F4329 Adjustment disorder with other symptoms: Secondary | ICD-10-CM

## 2024-06-11 ENCOUNTER — Encounter: Payer: MEDICAID | Admitting: Skilled Nursing Facility1

## 2024-06-12 ENCOUNTER — Encounter (INDEPENDENT_AMBULATORY_CARE_PROVIDER_SITE_OTHER): Payer: Self-pay | Admitting: Family

## 2024-06-12 ENCOUNTER — Ambulatory Visit (INDEPENDENT_AMBULATORY_CARE_PROVIDER_SITE_OTHER): Payer: MEDICAID | Admitting: Family

## 2024-06-12 VITALS — BP 126/78 | Wt <= 1120 oz

## 2024-06-12 DIAGNOSIS — G40409 Other generalized epilepsy and epileptic syndromes, not intractable, without status epilepticus: Secondary | ICD-10-CM | POA: Diagnosis not present

## 2024-06-12 DIAGNOSIS — G808 Other cerebral palsy: Secondary | ICD-10-CM | POA: Diagnosis not present

## 2024-06-12 DIAGNOSIS — K5900 Constipation, unspecified: Secondary | ICD-10-CM | POA: Diagnosis not present

## 2024-06-12 DIAGNOSIS — R1310 Dysphagia, unspecified: Secondary | ICD-10-CM

## 2024-06-12 DIAGNOSIS — R638 Other symptoms and signs concerning food and fluid intake: Secondary | ICD-10-CM

## 2024-06-12 DIAGNOSIS — R634 Abnormal weight loss: Secondary | ICD-10-CM

## 2024-06-12 DIAGNOSIS — R636 Underweight: Secondary | ICD-10-CM | POA: Diagnosis not present

## 2024-06-12 DIAGNOSIS — G40309 Generalized idiopathic epilepsy and epileptic syndromes, not intractable, without status epilepticus: Secondary | ICD-10-CM

## 2024-06-12 DIAGNOSIS — Z681 Body mass index (BMI) 19 or less, adult: Secondary | ICD-10-CM | POA: Diagnosis not present

## 2024-06-12 DIAGNOSIS — F4329 Adjustment disorder with other symptoms: Secondary | ICD-10-CM

## 2024-06-12 MED ORDER — POLYETHYLENE GLYCOL 3350 17 GM/SCOOP PO POWD: ORAL | 5 refills | Status: AC

## 2024-06-12 NOTE — Progress Notes (Signed)
 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 She can have oppositional behavior but that has improved over time.    Seizures have been well controlled for years on the combination of Carbatrol  and gabapentin  which he tolerates without side effects.  She is totally dependent on others for dressing and bathing changing her diaper, is unable to feed herself or prepare meals. She has had problems with weight loss and is being followed by the dietician.   Due to her medical condition, Kristin Fitzgerald is indefinitely incontinent of stool and urine.  It is medically necessary for her to use diapers, underpads, and gloves to assist with hygiene and skin integrity.     Since last visit: She has remained seizure free since her last visit Mom reports that her behavior has improved and that she is having less angry mood and disruptive behavior Mom reports that she has been eating better.  Mom asked about a referral to a dentist for Kristin Fitzgerald. She believes that she has a cavity on a molar. She notes that Kristin Fitzgerald does not like brushing her teeth but that Mom tries to do oral care every day. Kristin Fitzgerald has been otherwise generally healthy since she was last seen. No health concerns today other than previously mentioned.  Review of systems: Please see HPI for neurologic and other  pertinent review of systems. Otherwise all other systems were reviewed and were negative.  Problem List: Patient Active Problem List   Diagnosis Date Noted   Need for assistance with personal care 03/06/2023   Increased nutritional needs [R63.8] 06/25/2022   Underweight [R63.6] 06/25/2022   Inadequate oral intake [R63.8] 06/25/2022   Full incontinence of feces 06/25/2022   Caregiver stress 12/12/2021   Insomnia 12/12/2021   Impaired nutrition 10/20/2021   Cough 08/03/2021   Loss of weight 08/11/2020   COVID-19 vaccine dose not administered 08/04/2020   Adjustment disorder with emotional disturbance 08/05/2019   Need for immunization against influenza 05/07/2017   Nutrition impaired due to limited access to healthful foods 05/07/2017   Thrush 07/09/2016   Constipation 07/09/2016   Dysphagia 10/25/2015   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 1 oz. Infant born at [redacted] weeks gestational age   Gestation was complicated  by premature delivery for unknown reasons. Nursery Course was complicated by seizures a day 3 of life.  The patient had no hemorrhage but had periventricular leukomalacia. 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 She has 76 CAPS hours per week. Her mother works in childcare.  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   She lives with her mother .   Social Drivers of Health   Tobacco Use: Medium Risk (06/12/2024)   Patient History    Smoking Tobacco Use: Never    Smokeless Tobacco Use: Never    Passive Exposure: Yes  Financial Resource Strain: Not on file  Food Insecurity: No Food Insecurity (12/21/2021)   Hunger Vital Sign    Worried About Running Out of Food in the Last Year: Never true    Ran Out of Food in the Last Year: Never true  Transportation Needs: No Transportation Needs (12/21/2021)   PRAPARE -  Administrator, Civil Service (Medical): No    Lack of Transportation (Non-Medical): No  Physical Activity: Not on file  Stress: Not on file  Social Connections: Not on file  Intimate Partner Violence: Not on file  Depression (EYV7-0): Low Risk (04/24/2024)   Depression (PHQ2-9)    PHQ-2 Score: 0  Alcohol Screen: Not on file  Housing: Low Risk (12/21/2021)   Housing    Last Housing Risk Score: 0  Utilities: Not on file  Health Literacy: Not on file   Past/failed meds:  Allergies: Allergies[1]   Immunizations: Immunization History  Administered Date(s) Administered   Hepatitis B 07/06/2011, 11/07/2011   Influenza Split 07/06/2011, 05/31/2012   Influenza, Seasonal, Injecte, Preservative Fre 03/06/2023, 04/10/2024   Influenza,inj,Quad PF,6+ Mos 04/16/2013, 04/21/2014, 06/07/2015, 07/07/2016, 05/07/2017, 04/30/2018, 03/05/2019, 05/12/2020    Diagnostics/Screenings: Copied from previous record: 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   Physical Exam: BP 126/78 (BP Location: Right Arm, Patient Position: Sitting, Cuff Size:  Normal)   Wt 68 lb 12.8 oz (31.2 kg)   LMP 06/10/2024 (Exact Date)   BMI 17.22 kg/m   Wt Readings from Last 3 Encounters:  06/12/24 68 lb 12.8 oz (31.2 kg)  04/24/24 64 lb 12.8 oz (29.4 kg)  02/21/24 68 lb (30.8 kg)  General: small for age, thin but well developed, well nourished, seated in wheelchair, in no evident distress Head: normocephalic and atraumatic. Oropharynx difficult to examine but appears benign. No dysmorphic features. Neck: supple Cardiovascular: regular rate and rhythm, no murmurs. Respiratory: clear to auscultation bilaterally Abdomen: bowel sounds present all four quadrants, abdomen soft, non-tender, non-distended.  Musculoskeletal: no skeletal deformities. Has severe convex left neuromuscular scoliosis. Has contractures at the right  elbow, both knees and right heel cords. Has right hemiatrophy upper greater than lower extremities.  Skin: no rashes or neurocutaneous lesions  Neurologic Exam Mental Status: awake and fully alert. Has limited language.  Smiles responsively. Unable to follow instructions or participate in examination Cranial Nerves: fundoscopic exam - red reflex present.  Unable to fully visualize fundus.  Pupils equal briskly reactive to light.  Turns to localize faces and objects in the periphery. Turns to localize sounds in the periphery. Facial movements are symmetric. Motor: spastic quadriparesis  Sensory: withdrawal x 4 Coordination: unable to adequately assess due to patient's inability to participate in examination. Has dysmetria with reach for objects. Gait and Station: unable to stand and bear weight.    Impression: Generalized convulsive epilepsy (HCC) - Plan: carbamazepine  (CARBATROL ) 200 MG 12 hr capsule, gabapentin  (NEURONTIN ) 100 MG capsule  Adjustment disorder with emotional disturbance - Plan: FLUoxetine  (PROZAC ) 20 MG/5ML solution  Constipation, unspecified constipation type - Plan: polyethylene glycol powder (GLYCOLAX /MIRALAX ) 17 GM/SCOOP powder  Loss of weight  Increased nutritional needs [R63.8]  Dysphagia, unspecified type  Underweight  Inadequate oral intake [R63.8]  Congenital quadriplegia (HCC)   Recommendations for plan of care: The patient's previous Epic records were reviewed. No recent diagnostic studies to be reviewed with the patient.   I gave Mom a list of dentists to call for Kristin Fitzgerald.   Recommendations and plan until next visit: Continue medications as prescribed  Call if seizures occur or for questions or concerns Return in about 4 months (around 10/11/2024).  The medication list was reviewed and reconciled. No changes were made in the prescribed medications today. A complete medication list was provided to the patient.  Allergies as of 06/12/2024        Reactions   Demerol Nausea And Vomiting        Medication List        Accurate as of June 12, 2024  8:30 PM. If you have any questions, ask your nurse or doctor.          STOP taking these medications    cetirizine  HCl 5 MG/5ML Syrp Commonly known as: Zyrtec  Stopped by: [name] Bollman, NP   ferrous sulfate  300 (60 Fe) MG/5ML syrup Stopped by: [name] Bollman, NP       TAKE these medications    carbamazepine  200 MG 12 hr capsule Commonly known as: CARBATROL  Take 1 capsule (200 mg total) by mouth 2 (two) times daily.   FLUoxetine  20 MG/5ML solution Commonly known as: PROZAC  Give 10ml once per day What changed: See the new instructions. Changed by: [name] Bollman, NP   gabapentin  100 MG capsule Commonly known as: NEURONTIN  TAKE 1 CAPSULE IN THE MORNING AND 2 CAPSULES AT BEDTIME   Nutritional Supplement Plus Liqd 3 Boost  Plus (chocolate only) or high-calorie (1.5 kcal/oz, chocolate only) alternative given PO daily.   polyethylene glycol powder 17 GM/SCOOP powder Commonly known as: GLYCOLAX /MIRALAX  Give 17gm (1 capful) daily      I spent 25 minutes caring for the patient today face to face reviewing records, including previous charts and test results, examination of the patient, discussion and education with her mother about her condition, documentation in her chart, developing a plan of care and ordering refills.   [name] Bollman NP-C [facility] [address] [place], [place] [address] Ph. [phone] Fax [phone]           [1]  Allergies Allergen Reactions   Demerol Nausea And Vomiting

## 2024-06-12 NOTE — Patient Instructions (Addendum)
 It was a pleasure to see you today! I have given you a list of dentists to call.   [facility]    [phone] [facility]  [phone] Elza Hamburger DDS    [phone] [facility]     [phone] Abran Kenner, DDS    [phone] [facility]     [phone]  Instructions for you until your next appointment are as follows: Continue taking your medications as prescribed Call me if seizures occur or for other concerns Please sign up for MyChart if you have not done so. Please plan to return for follow up in 4 months or sooner if needed.  Feel free to contact our office during normal business hours at [phone] with questions or concerns. If there is no answer or the call is outside business hours, please leave a message and our clinic staff will call you back within the next business day.  If you have an urgent concern, please stay on the line for our after-hours answering service and ask for the on-call neurologist.     I also encourage you to use MyChart to communicate with me more directly. If you have not yet signed up for MyChart within [facility], the front desk staff can help you. However, please note that this inbox is NOT monitored on nights or weekends, and response can take up to 2 business days.  Urgent matters should be discussed with the on-call pediatric neurologist.   At [facility], we are committed to providing exceptional care. You will receive a patient satisfaction survey through text or email regarding your visit today. Your opinion is important to me. Comments are appreciated.

## 2024-10-09 ENCOUNTER — Ambulatory Visit (INDEPENDENT_AMBULATORY_CARE_PROVIDER_SITE_OTHER): Payer: Self-pay | Admitting: Pediatrics
